# Patient Record
Sex: Male | Born: 1940 | Race: White | Hispanic: No | Marital: Married | Smoking: Never smoker
Health system: Southern US, Community
[De-identification: ages and names within clinical notes are randomized; demographics above are authoritative.]

## PROBLEM LIST (undated history)

## (undated) DIAGNOSIS — N401 Enlarged prostate with lower urinary tract symptoms: Secondary | ICD-10-CM

## (undated) DIAGNOSIS — Z973 Presence of spectacles and contact lenses: Secondary | ICD-10-CM

## (undated) DIAGNOSIS — J301 Allergic rhinitis due to pollen: Secondary | ICD-10-CM

## (undated) DIAGNOSIS — K219 Gastro-esophageal reflux disease without esophagitis: Secondary | ICD-10-CM

## (undated) DIAGNOSIS — Z85828 Personal history of other malignant neoplasm of skin: Secondary | ICD-10-CM

## (undated) DIAGNOSIS — M26609 Unspecified temporomandibular joint disorder, unspecified side: Secondary | ICD-10-CM

## (undated) DIAGNOSIS — N138 Other obstructive and reflux uropathy: Secondary | ICD-10-CM

## (undated) DIAGNOSIS — I10 Essential (primary) hypertension: Secondary | ICD-10-CM

## (undated) DIAGNOSIS — C801 Malignant (primary) neoplasm, unspecified: Secondary | ICD-10-CM

## (undated) DIAGNOSIS — N400 Enlarged prostate without lower urinary tract symptoms: Secondary | ICD-10-CM

## (undated) DIAGNOSIS — A389 Scarlet fever, uncomplicated: Secondary | ICD-10-CM

## (undated) DIAGNOSIS — Z978 Presence of other specified devices: Secondary | ICD-10-CM

## (undated) DIAGNOSIS — E039 Hypothyroidism, unspecified: Secondary | ICD-10-CM

## (undated) DIAGNOSIS — M1990 Unspecified osteoarthritis, unspecified site: Secondary | ICD-10-CM

## (undated) HISTORY — PX: TONSILLECTOMY: SUR1361

## (undated) HISTORY — DX: Essential (primary) hypertension: I10

## (undated) HISTORY — DX: Unspecified temporomandibular joint disorder, unspecified side: M26.609

## (undated) HISTORY — DX: Allergic rhinitis due to pollen: J30.1

## (undated) HISTORY — DX: Scarlet fever, uncomplicated: A38.9

---

## 1998-12-11 ENCOUNTER — Encounter: Payer: Self-pay | Admitting: Internal Medicine

## 1998-12-11 ENCOUNTER — Ambulatory Visit (HOSPITAL_COMMUNITY): Admission: RE | Admit: 1998-12-11 | Discharge: 1998-12-11 | Payer: Self-pay | Admitting: Internal Medicine

## 1999-07-24 ENCOUNTER — Ambulatory Visit (HOSPITAL_COMMUNITY): Admission: RE | Admit: 1999-07-24 | Discharge: 1999-07-24 | Payer: Self-pay | Admitting: Gastroenterology

## 1999-07-24 ENCOUNTER — Encounter (INDEPENDENT_AMBULATORY_CARE_PROVIDER_SITE_OTHER): Payer: Self-pay

## 2004-10-09 ENCOUNTER — Ambulatory Visit: Payer: Self-pay | Admitting: Internal Medicine

## 2004-12-14 ENCOUNTER — Ambulatory Visit: Payer: Self-pay | Admitting: Internal Medicine

## 2004-12-20 ENCOUNTER — Ambulatory Visit: Payer: Self-pay | Admitting: Internal Medicine

## 2006-01-29 ENCOUNTER — Ambulatory Visit: Payer: Self-pay | Admitting: Internal Medicine

## 2006-02-03 ENCOUNTER — Ambulatory Visit: Payer: Self-pay | Admitting: Internal Medicine

## 2006-04-07 ENCOUNTER — Encounter: Admission: RE | Admit: 2006-04-07 | Discharge: 2006-04-07 | Payer: Self-pay | Admitting: Sports Medicine

## 2006-04-17 ENCOUNTER — Encounter: Admission: RE | Admit: 2006-04-17 | Discharge: 2006-04-17 | Payer: Self-pay | Admitting: Sports Medicine

## 2006-07-08 ENCOUNTER — Ambulatory Visit: Payer: Self-pay | Admitting: Internal Medicine

## 2006-07-08 LAB — CONVERTED CEMR LAB: PSA: 1.79 ng/mL (ref 0.10–4.00)

## 2007-01-01 ENCOUNTER — Ambulatory Visit: Payer: Self-pay | Admitting: Internal Medicine

## 2007-07-17 ENCOUNTER — Encounter: Payer: Self-pay | Admitting: *Deleted

## 2007-07-17 DIAGNOSIS — M26609 Unspecified temporomandibular joint disorder, unspecified side: Secondary | ICD-10-CM | POA: Insufficient documentation

## 2007-07-17 DIAGNOSIS — Z86010 Personal history of colonic polyps: Secondary | ICD-10-CM | POA: Insufficient documentation

## 2007-07-17 DIAGNOSIS — Z9089 Acquired absence of other organs: Secondary | ICD-10-CM | POA: Insufficient documentation

## 2007-07-17 DIAGNOSIS — A389 Scarlet fever, uncomplicated: Secondary | ICD-10-CM | POA: Insufficient documentation

## 2007-07-17 DIAGNOSIS — J301 Allergic rhinitis due to pollen: Secondary | ICD-10-CM | POA: Insufficient documentation

## 2007-07-17 DIAGNOSIS — I10 Essential (primary) hypertension: Secondary | ICD-10-CM | POA: Insufficient documentation

## 2007-08-07 ENCOUNTER — Ambulatory Visit: Payer: Self-pay | Admitting: Internal Medicine

## 2007-08-07 LAB — CONVERTED CEMR LAB
ALT: 22 units/L (ref 0–53)
AST: 23 units/L (ref 0–37)
Albumin: 3.8 g/dL (ref 3.5–5.2)
Alkaline Phosphatase: 60 units/L (ref 39–117)
BUN: 12 mg/dL (ref 6–23)
Basophils Absolute: 0 10*3/uL (ref 0.0–0.1)
Basophils Relative: 0.5 % (ref 0.0–1.0)
Bilirubin Urine: NEGATIVE
Bilirubin, Direct: 0.2 mg/dL (ref 0.0–0.3)
CO2: 29 meq/L (ref 19–32)
Calcium: 9.6 mg/dL (ref 8.4–10.5)
Chloride: 105 meq/L (ref 96–112)
Cholesterol: 189 mg/dL (ref 0–200)
Creatinine, Ser: 1.1 mg/dL (ref 0.4–1.5)
Eosinophils Absolute: 0.2 10*3/uL (ref 0.0–0.6)
Eosinophils Relative: 2.8 % (ref 0.0–5.0)
GFR calc Af Amer: 86 mL/min
GFR calc non Af Amer: 71 mL/min
Glucose, Bld: 97 mg/dL (ref 70–99)
HCT: 45.1 % (ref 39.0–52.0)
HDL: 58.4 mg/dL (ref >39.0)
Hemoglobin, Urine: NEGATIVE
Hemoglobin: 15.1 g/dL (ref 13.0–17.0)
Ketones, ur: NEGATIVE mg/dL
LDL Cholesterol: 110 mg/dL — ABNORMAL HIGH (ref 0–99)
Leukocytes, UA: NEGATIVE
Lymphocytes Relative: 30.6 % (ref 12.0–46.0)
MCHC: 33.4 g/dL (ref 30.0–36.0)
MCV: 93.1 fL (ref 78.0–100.0)
Monocytes Absolute: 0.6 10*3/uL (ref 0.2–0.7)
Monocytes Relative: 7.8 % (ref 3.0–11.0)
Neutro Abs: 4.3 10*3/uL (ref 1.4–7.7)
Neutrophils Relative %: 58.3 % (ref 43.0–77.0)
Nitrite: NEGATIVE
PSA: 1.58 ng/mL (ref 0.10–4.00)
Platelets: 226 10*3/uL (ref 150–400)
Potassium: 4.4 meq/L (ref 3.5–5.1)
RBC: 4.84 M/uL (ref 4.22–5.81)
RDW: 12.5 % (ref 11.5–14.6)
Sodium: 140 meq/L (ref 135–145)
Specific Gravity, Urine: 1.015 (ref 1.000–1.03)
TSH: 3.38 microintl units/mL (ref 0.35–5.50)
Total Bilirubin: 0.9 mg/dL (ref 0.3–1.2)
Total CHOL/HDL Ratio: 3.2
Total Protein, Urine: NEGATIVE mg/dL
Total Protein: 6.5 g/dL (ref 6.0–8.3)
Triglycerides: 104 mg/dL (ref 0–149)
Urine Glucose: NEGATIVE mg/dL
Urobilinogen, UA: 0.2 (ref 0.0–1.0)
VLDL: 21 mg/dL (ref 0–40)
WBC: 7.3 10*3/uL (ref 4.5–10.5)
pH: 6 (ref 5.0–8.0)

## 2007-08-13 ENCOUNTER — Ambulatory Visit: Payer: Self-pay | Admitting: Internal Medicine

## 2007-08-20 ENCOUNTER — Encounter: Payer: Self-pay | Admitting: Internal Medicine

## 2008-03-24 ENCOUNTER — Telehealth (INDEPENDENT_AMBULATORY_CARE_PROVIDER_SITE_OTHER): Payer: Self-pay | Admitting: *Deleted

## 2008-06-01 ENCOUNTER — Ambulatory Visit: Payer: Self-pay | Admitting: Internal Medicine

## 2008-06-01 DIAGNOSIS — J069 Acute upper respiratory infection, unspecified: Secondary | ICD-10-CM | POA: Insufficient documentation

## 2008-11-14 ENCOUNTER — Ambulatory Visit: Payer: Self-pay | Admitting: Internal Medicine

## 2008-11-16 ENCOUNTER — Encounter (INDEPENDENT_AMBULATORY_CARE_PROVIDER_SITE_OTHER): Payer: Self-pay | Admitting: *Deleted

## 2009-01-11 ENCOUNTER — Ambulatory Visit: Payer: Self-pay | Admitting: Internal Medicine

## 2009-01-11 LAB — CONVERTED CEMR LAB
ALT: 21 units/L (ref 0–53)
AST: 28 units/L (ref 0–37)
Albumin: 4 g/dL (ref 3.5–5.2)
Alkaline Phosphatase: 57 units/L (ref 39–117)
BUN: 19 mg/dL (ref 6–23)
Basophils Absolute: 0.1 10*3/uL (ref 0.0–0.1)
Basophils Relative: 0.9 % (ref 0.0–3.0)
Bilirubin Urine: NEGATIVE
Bilirubin, Direct: 0.2 mg/dL (ref 0.0–0.3)
CO2: 30 meq/L (ref 19–32)
Calcium: 9.1 mg/dL (ref 8.4–10.5)
Chloride: 107 meq/L (ref 96–112)
Cholesterol: 167 mg/dL (ref 0–200)
Creatinine, Ser: 1.1 mg/dL (ref 0.4–1.5)
Eosinophils Absolute: 0.2 10*3/uL (ref 0.0–0.7)
Eosinophils Relative: 3 % (ref 0.0–5.0)
GFR calc non Af Amer: 70.67 mL/min (ref >60)
Glucose, Bld: 97 mg/dL (ref 70–99)
HCT: 43 % (ref 39.0–52.0)
HDL: 68 mg/dL (ref >39.00)
Hemoglobin, Urine: NEGATIVE
Hemoglobin: 15 g/dL (ref 13.0–17.0)
Ketones, ur: NEGATIVE mg/dL
LDL Cholesterol: 90 mg/dL (ref 0–99)
Leukocytes, UA: NEGATIVE
Lymphocytes Relative: 29.2 % (ref 12.0–46.0)
Lymphs Abs: 2 10*3/uL (ref 0.7–4.0)
MCHC: 34.9 g/dL (ref 30.0–36.0)
MCV: 93.9 fL (ref 78.0–100.0)
Monocytes Absolute: 0.6 10*3/uL (ref 0.1–1.0)
Monocytes Relative: 8.5 % (ref 3.0–12.0)
Neutro Abs: 4 10*3/uL (ref 1.4–7.7)
Neutrophils Relative %: 58.4 % (ref 43.0–77.0)
Nitrite: NEGATIVE
PSA: 2.08 ng/mL (ref 0.10–4.00)
Platelets: 167 10*3/uL (ref 150.0–400.0)
Potassium: 4.4 meq/L (ref 3.5–5.1)
RBC: 4.58 M/uL (ref 4.22–5.81)
RDW: 12.7 % (ref 11.5–14.6)
Sodium: 141 meq/L (ref 135–145)
Specific Gravity, Urine: 1.025 (ref 1.000–1.030)
TSH: 5.12 microintl units/mL (ref 0.35–5.50)
Total Bilirubin: 1.1 mg/dL (ref 0.3–1.2)
Total CHOL/HDL Ratio: 2
Total Protein, Urine: NEGATIVE mg/dL
Total Protein: 7.1 g/dL (ref 6.0–8.3)
Triglycerides: 45 mg/dL (ref 0.0–149.0)
Urine Glucose: NEGATIVE mg/dL
Urobilinogen, UA: 0.2 (ref 0.0–1.0)
VLDL: 9 mg/dL (ref 0.0–40.0)
WBC: 6.9 10*3/uL (ref 4.5–10.5)
pH: 6 (ref 5.0–8.0)

## 2009-01-16 ENCOUNTER — Ambulatory Visit: Payer: Self-pay | Admitting: Internal Medicine

## 2010-02-15 ENCOUNTER — Ambulatory Visit: Payer: Self-pay | Admitting: Internal Medicine

## 2010-03-22 ENCOUNTER — Ambulatory Visit: Payer: Self-pay | Admitting: Internal Medicine

## 2010-03-22 ENCOUNTER — Encounter: Payer: Self-pay | Admitting: Internal Medicine

## 2010-03-22 LAB — CONVERTED CEMR LAB
ALT: 23 units/L (ref 0–53)
AST: 24 units/L (ref 0–37)
Albumin: 4.2 g/dL (ref 3.5–5.2)
Alkaline Phosphatase: 65 units/L (ref 39–117)
BUN: 17 mg/dL (ref 6–23)
Basophils Absolute: 0.1 10*3/uL (ref 0.0–0.1)
Basophils Relative: 0.7 % (ref 0.0–3.0)
Bilirubin, Direct: 0.1 mg/dL (ref 0.0–0.3)
CO2: 27 meq/L (ref 19–32)
Calcium: 9.2 mg/dL (ref 8.4–10.5)
Chloride: 103 meq/L (ref 96–112)
Cholesterol: 194 mg/dL (ref 0–200)
Creatinine, Ser: 1.1 mg/dL (ref 0.4–1.5)
Eosinophils Absolute: 0.2 10*3/uL (ref 0.0–0.7)
Eosinophils Relative: 2.2 % (ref 0.0–5.0)
GFR calc non Af Amer: 71.93 mL/min (ref >60)
Glucose, Bld: 97 mg/dL (ref 70–99)
HCT: 43.5 % (ref 39.0–52.0)
HDL: 75.4 mg/dL (ref >39.00)
Hemoglobin: 15 g/dL (ref 13.0–17.0)
LDL Cholesterol: 106 mg/dL — ABNORMAL HIGH (ref 0–99)
Lymphocytes Relative: 27.9 % (ref 12.0–46.0)
Lymphs Abs: 2.4 10*3/uL (ref 0.7–4.0)
MCHC: 34.6 g/dL (ref 30.0–36.0)
MCV: 94.8 fL (ref 78.0–100.0)
Monocytes Absolute: 0.7 10*3/uL (ref 0.1–1.0)
Monocytes Relative: 8.6 % (ref 3.0–12.0)
Neutro Abs: 5.2 10*3/uL (ref 1.4–7.7)
Neutrophils Relative %: 60.6 % (ref 43.0–77.0)
PSA: 2.43 ng/mL (ref 0.10–4.00)
Platelets: 198 10*3/uL (ref 150.0–400.0)
Potassium: 4.5 meq/L (ref 3.5–5.1)
RBC: 4.59 M/uL (ref 4.22–5.81)
RDW: 13.1 % (ref 11.5–14.6)
Sodium: 137 meq/L (ref 135–145)
TSH: 4.35 microintl units/mL (ref 0.35–5.50)
Total Bilirubin: 0.6 mg/dL (ref 0.3–1.2)
Total CHOL/HDL Ratio: 3
Total Protein: 6.9 g/dL (ref 6.0–8.3)
Triglycerides: 61 mg/dL (ref 0.0–149.0)
VLDL: 12.2 mg/dL (ref 0.0–40.0)
WBC: 8.5 10*3/uL (ref 4.5–10.5)

## 2010-05-16 ENCOUNTER — Telehealth: Payer: Self-pay | Admitting: Internal Medicine

## 2010-07-10 NOTE — Progress Notes (Signed)
Summary: PA-Rhinocort  Phone Note From Pharmacy Call back at [phone]   Caller: [facility]  [address]  [phone]* Summary of Call: Per Pharmacy pt needs a PA for Rhinocort-insurance will cover generic Nasonex, Flonase Initial call taken by: Brenton Grills CMA Duncan Dull),  May 16, 2010 2:45 PM  Follow-up for Phone Call        ok for generic fluticasone 1 spray to each nostril once a day, 1 bottle, refill as needed.  Follow-up by: Jacques Navy MD,  May 16, 2010 5:08 PM    New/Updated Medications: FLONASE 50 MCG/ACT SUSP (FLUTICASONE PROPIONATE) 1 spray each nostril once daily Prescriptions: FLONASE 50 MCG/ACT SUSP (FLUTICASONE PROPIONATE) 1 spray each nostril once daily  #1 x 6   Entered by:   Lamar Sprinkles, CMA   Authorized by:   Jacques Navy MD   Signed by:   Lamar Sprinkles, CMA on 05/16/2010   Method used:   [facility]        [facility]  [phone]* (retail)       [address] [place], [place]  [address]       Ph: [phone] or [phone]       Fax: [phone]   RxID:   [id number]

## 2010-07-10 NOTE — Assessment & Plan Note (Signed)
Summary: yearly f/u medicare / will come fasting / # cd   Vital Signs:  Patient profile:   70 year old male Height:      69 inches Weight:      179 pounds BMI:     26.53 O2 Sat:      97 % on Room air Temp:     98.1 degrees F oral Pulse rate:   50 / minute BP sitting:   136 / 88  (left arm) Cuff size:   regular  Vitals Entered By: Bill Salinas CMA (March 22, 2010 10:00 AM)  O2 Flow:  Room air CC: yearly/ab  Vision Screening:      Vision Comments: Last eye exam was oct 2010 normal . Pt is sch for eye exam next week   Primary Care Provider:  Jacques Navy MD  CC:  yearly/ab.  History of Present Illness: Patient presents for a wellness exam and routine medical follow-up. He is feeling good. No major medical problems or injuries or surgies since his last visit. He has had irritation OS secondary to eyelash problem. He is lining this up with an opthalmologist.   He is 100% independent ADLs. 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 No symptoms of depression.   Preventive Screening-Counseling & Management  Alcohol-Tobacco     Alcohol drinks/day: <1     Alcohol type: beer     Smoking Status: never  Caffeine-Diet-Exercise     Caffeine use/day: 1 cup      Diet Comments: regular diet, heavy on the biscuits     Diet Counseling: not indicated; diet is assessed to be healthy     Does Patient Exercise: yes     Type of exercise: walking,      Exercise (avg: min/session): >60     Times/week: 6     MSH Depression Score: no depression  Hep-HIV-STD-Contraception     Hepatitis Risk: no risk noted     HIV Risk: no risk noted     STD Risk: no risk noted     Dental Visit-last 6 months no     Dental Care Counseling: to seek dental care; no dental care within six months     Sun Exposure-Excessive: no  Safety-Violence-Falls     Seat Belt Use: yes     Helmet Use: n/a     Firearms in the Home: no firearms in the home   Smoke Detectors: yes     Violence in the Home: no risk noted     Sexual Abuse: no     Fall Risk: Low fall risk      Sexual History:  currently monogamous.        Drug Use:  never.        Blood Transfusions:  no.    Current Medications (verified): 1)  Cardura 4 Mg  Tabs (Doxazosin Mesylate) .... Take 1 Tablet By Mouth Once A Day 2)  Prinivil 10 Mg  Tabs (Lisinopril) .... Take 1 Tablet By Mouth Once A Day 3)  Rhinocort Aqua 32 Mcg/act  Susp (Budesonide) .... Use As Needed and As Directed. 4)  Aspirin 81 Mg  Tabs (Aspirin) .... 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   Tobacco: no EtOH: <6 drinks/week (beer)  Social History: Caffeine use/day:  1 cup  Does Patient Exercise:  yes Dental Care w/in 6 mos.:  no Sun Exposure-Excessive:  no Seat Belt Use:  yes Fall Risk:  Low fall risk Hepatitis Risk:  no risk noted HIV Risk:  no risk noted STD Risk:  no risk noted Sexual History:  currently monogamous Drug Use:  never Blood Transfusions:  no  Review of Systems  The patient denies anorexia, fever, weight loss, weight gain, decreased hearing, hoarseness, chest pain, dyspnea on exertion, peripheral edema, prolonged cough, hemoptysis, abdominal  pain, hematochezia, severe indigestion/heartburn, incontinence, genital sores, muscle weakness, difficulty walking, depression, unusual weight change, enlarged lymph nodes, and angioedema.    Physical Exam  General:  Well-developed,well-nourished,in no acute distress; alert,appropriate and cooperative throughout examination Head:  Normocephalic and atraumatic without obvious abnormalities. No apparent alopecia or balding. Eyes:  No corneal or conjunctival inflammation noted. EOMI. Perrla. Funduscopic exam benign, without hemorrhages, exudates or papilledema. Vision grossly normal. Ears:  External ear exam shows no significant lesions or deformities.  Otoscopic examination reveals clear canals, tympanic membranes are intact bilaterally without bulging, retraction, inflammation or discharge. Hearing is grossly normal bilaterally. Nose:  no external deformity and no external erythema.   Mouth:  Oral mucosa and oropharynx without lesions or exudates.  Teeth in good repair. Neck:  supple, full ROM, and no thyromegaly.   Chest Wall:  No deformities, masses, tenderness or gynecomastia noted. Lungs:  Normal respiratory effort, chest expands symmetrically. Lungs are clear to auscultation, no crackles or wheezes. Heart:  Normal rate and regular rhythm. S1 and S2 normal without gallop, murmur, click, rub or other extra sounds. Abdomen:  soft, non-tender, no masses, no guarding, and no hepatomegaly.   Rectal:  No external abnormalities noted. Normal sphincter tone. No rectal masses or tenderness. Prostate:  Prostate gland firm and smooth, no enlargement, nodularity, tenderness, mass, asymmetry or induration. Msk:  normal ROM, no joint tenderness, no joint swelling, and no joint warmth.   Pulses:  2+ radial and DP pulses Extremities:  No clubbing, cyanosis, edema, or deformity noted with normal full range of motion of all joints.   Neurologic:  alert & oriented X3, cranial nerves II-XII intact, strength  normal in all extremities, gait normal, and DTRs symmetrical and normal.   Skin:  turgor normal, color normal, and no suspicious lesions.   Cervical Nodes:  no anterior cervical adenopathy and no posterior cervical adenopathy.   Axillary Nodes:  no R axillary adenopathy and no L axillary adenopathy.   Psych:  Oriented X3, memory intact for recent and remote, normally interactive, and good eye contact.     Impression & Recommendations:  Problem # 1:  HYPERTENSION (ICD-401.9)  His updated medication list for this problem includes:    Cardura 4 Mg Tabs (Doxazosin mesylate) .Marland Kitchen... Take 1 tablet by mouth once a day    Prinivil 10 Mg Tabs (Lisinopril) .Marland Kitchen... Take 1 tablet by mouth once a day  BP today: 136/88 Prior BP: 98/62 (01/16/2009)  Adeqaute control on present medications  Problem # 2:  Preventive Health Care (ICD-V70.0)  Unremarkable interval history. PHysical exam is normal. Lab results, including PSA are within normal limits.  Last colonoscopy /09. Immunizations: Tetnus & flu vaccine Sept '11; Pneumonia and shingles vaccine August '07.   Patient is independent in ADLs, cognitively intact, without depression. He has had no falls and has no fall risk. He is counselled to continue exercise, to loose weight   In summary - a nice man who is stable and doing well. ROV 1 year.   Orders: Medicare -1st Annual Wellness Visit [phone])  Complete Medication List: 1)  Cardura 4 Mg Tabs (Doxazosin mesylate) .... Take 1 tablet by mouth once a day 2)  Prinivil 10 Mg Tabs (Lisinopril) .... Take 1 tablet by mouth once a day 3)  Rhinocort Aqua 32 Mcg/act Susp (Budesonide) .... Use as needed and as directed. 4)  Aspirin 81 Mg Tabs (Aspirin) .... Take one tablet once daily  Other Orders: TLB-BMP (Basic Metabolic Panel-BMET) (80048-METABOL) TLB-CBC Platelet - w/Differential (85025-CBCD) TLB-Lipid Panel (80061-LIPID) TLB-Hepatic/Liver Function Pnl (80076-HEPATIC) TLB-TSH (Thyroid Stimulating  Hormone) (84443-TSH) TLB-PSA (Prostate Specific Antigen) (27062-BJS)   Preventive Care Screening  Colonoscopy:    Date:  08/20/2007    Next Due:  08/2012    Results:  abnormal

## 2010-07-10 NOTE — Progress Notes (Signed)
  Phone Note Call from Patient      Prescriptions: RHINOCORT AQUA 32 MCG/ACT  SUSP (BUDESONIDE) use as needed and as directed.  #1 x 2   Entered by:   Bill Salinas CMA   Authorized by:   Jacques Navy MD   Signed by:   Bill Salinas CMA on 05/16/2010   Method used:   [facility]        [facility]  [phone]* (retail)       [address] [place], [place]  [address]       Ph: [phone] or [phone]       Fax: [phone]   RxID:   [id number]

## 2010-07-10 NOTE — Assessment & Plan Note (Signed)
Summary: flu shot & tdap shot (pt will check w/medicare)cd  Nurse Visit   Allergies: No Known Drug Allergies  Immunizations Administered:  Tetanus Vaccine:    Vaccine Type: Tdap    Site: left deltoid    Mfr: Sanofi Pasteur    Dose: 0.5 ml    Route: IM    Given by: Margaret Pyle, CMA    Exp. Date: 03/29/2012    Lot #: [id number]    VIS given: 04/27/08 version given February 15, 2010.  Orders Added: 1)  Flu Vaccine 54yrs + MEDICARE PATIENTS [Q2039] 2)  Administration Flu vaccine - MCR [G0008] 3)  Tdap => 59yrs IM [90715] 4)  Admin 1st Vaccine [09811] Flu Vaccine Consent Questions     Do you have a history of severe allergic reactions to this vaccine? no    Any prior history of allergic reactions to egg and/or gelatin? no    Do you have a sensitivity to the preservative Thimersol? no    Do you have a past history of Guillan-Barre Syndrome? no    Do you currently have an acute febrile illness? no    Have you ever had a severe reaction to latex? no    Vaccine information given and explained to patient? yes    Are you currently pregnant? no    Lot Number:[id number]   Exp Date:12/08/2010   Site Given  Right Deltoid IM .lbmedflu

## 2010-10-26 NOTE — Assessment & Plan Note (Signed)
[facility]                             PRIMARY CARE OFFICE NOTE   Nathan Reilly, Nathan Reilly                         MRN:          [id number]  DATE:02/03/2006                            DOB:          [birth year]    Nathan Reilly is a very delightful 70 year old gentleman who presents for  follow-up evaluation and exam.  He was last seen December 20, 2004.  Please see  that dictation for complete past medical history, family history and social  history.   INTERVAL:  The patient has done well with no new health problems.   REVIEW OF SYSTEMS:  Negative for constitutional, cardiovascular,  respiratory, GI or GU problems.   CURRENT MEDICATIONS:  1. Cardura 4 mg daily.  2. Prinivil 10 mg daily.  3. Claritin p.r.n.  4. Rhinocort Aqua p.r.n.   PHYSICAL EXAMINATION:  VITAL SIGNS:  Temperature was 98.1, blood pressure  108/73, pulse 59, weight 184.  GENERAL APPEARANCE:  A well-nourished, well-developed gentleman looking  younger than his stated chronologic age in no acute distress.  HEENT:  Normocephalic, atraumatic.  EACs and TMs were unremarkable.  Oropharynx with native dentition in good repair.  No buccal or palatal  lesions were noted.  Posterior pharynx was clear.  Conjunctivae and sclerae  were clear.  PERRLA, EOMI.  Funduscopic exam was unremarkable.  NECK:  Supple without thyromegaly.  NODES:  No adenopathy was noted in the cervical or supraclavicular regions.  CHEST:  No CVA tenderness.  Lungs were clear to auscultation and percussion.  CARDIOVASCULAR:  2+ radial pulses, no JVD or carotid bruit.  He had a quiet  precordium with regular rate and rhythm without murmurs, rubs or gallops.  ABDOMEN:  Soft, no guarding or rebound.  No organosplenomegaly was noted.  GENITALIA:  Normal male phallus, bilaterally descended testicles without  masses.  RECTAL:  Normal sphincter tone.  The prostate smooth with normal size and  contour, without nodules.  EXTREMITIES:   Without clubbing, cyanosis, edema or deformity.  NEUROLOGIC:  Nonfocal.  SKIN:  Clear.   LABORATORY DATA:  Hemoglobin was 15.4 g, white count was 7900 with a normal  differential.  Chemistries were unremarkable with glucose of 108.  Kidney  functions and liver functions were normal.  Thyroid function normal with a  TSH of 3.41.  PSA was normal at 1.72.  Cholesterol was 197, triglycerides  50, HDL 65.9, LDL was 121.   ASSESSMENT AND PLAN:  1. Hypertension.  The patient's blood pressure is very well-controlled on      his present medical regimen.  He will continue the same.  2. Health maintenance.  The patient's last colonoscopy was in 2004 with      Griffith Citron, MD.  Follow-up will be determined by Dr. Kinnie Scales and      the patient.  The patient's exam is unremarkable.  There is no evidence      of prostate cancer and he was reassured.  Cholesterol level is at goal      for a gentleman with low to moderate risk with an LDL  of 121.  The      patient was given pneumonia vaccine and Zostavax at today's visit.   In summary, this is a very pleasant gentleman who is medically stable at  this time.  He will return to see me on a one-year basis or as needed.                                   Rosalyn Gess Norins, MD   MEN/MedQ  DD:  02/03/2006  DT:  02/04/2006  Job #:  [id number]   cc:   Diana Eves. Raul Del, MD

## 2011-03-05 ENCOUNTER — Encounter: Payer: Self-pay | Admitting: Internal Medicine

## 2011-03-18 ENCOUNTER — Other Ambulatory Visit: Payer: Self-pay | Admitting: *Deleted

## 2011-03-18 MED ORDER — LISINOPRIL 10 MG PO TABS: 10.0000 mg | ORAL_TABLET | Freq: Every day | ORAL | Status: DC

## 2011-04-09 ENCOUNTER — Other Ambulatory Visit: Payer: Self-pay | Admitting: Internal Medicine

## 2011-04-10 ENCOUNTER — Encounter: Payer: Self-pay | Admitting: Internal Medicine

## 2011-04-11 ENCOUNTER — Other Ambulatory Visit (INDEPENDENT_AMBULATORY_CARE_PROVIDER_SITE_OTHER): Payer: Medicare Other

## 2011-04-11 ENCOUNTER — Ambulatory Visit (INDEPENDENT_AMBULATORY_CARE_PROVIDER_SITE_OTHER): Payer: Medicare Other | Admitting: Internal Medicine

## 2011-04-11 ENCOUNTER — Encounter: Payer: Self-pay | Admitting: Internal Medicine

## 2011-04-11 VITALS — BP 116/86 | HR 62 | Temp 97.5°F | Ht 68.0 in | Wt 176.0 lb

## 2011-04-11 DIAGNOSIS — I10 Essential (primary) hypertension: Secondary | ICD-10-CM

## 2011-04-11 DIAGNOSIS — Z23 Encounter for immunization: Secondary | ICD-10-CM

## 2011-04-11 DIAGNOSIS — Z125 Encounter for screening for malignant neoplasm of prostate: Secondary | ICD-10-CM

## 2011-04-11 DIAGNOSIS — Z0000 Encounter for general adult medical examination without abnormal findings: Secondary | ICD-10-CM

## 2011-04-11 LAB — COMPREHENSIVE METABOLIC PANEL
BUN: 22 mg/dL (ref 6–23)
CO2: 25 mEq/L (ref 19–32)
Creatinine, Ser: 1.1 mg/dL (ref 0.4–1.5)
GFR: 70.21 mL/min (ref >60.00)
Glucose, Bld: 94 mg/dL (ref 70–99)
Total Bilirubin: 0.8 mg/dL (ref 0.3–1.2)

## 2011-04-11 MED ORDER — FLUTICASONE PROPIONATE 50 MCG/ACT NA SUSP: 2.0000 | Freq: Every day | NASAL | Status: DC

## 2011-04-11 NOTE — Progress Notes (Signed)
Subjective:    Patient ID: Nathan Reilly, male    DOB: 1941/05/24, 70 y.o.   MRN: [id number]  HPI  The patient is here for annual Medicare wellness examination and management of other chronic and acute problems. Has developed DIP swelling 5th digit both hands with a minor AM gel.    The risk factors are reflected in the social history.  The roster of all physicians providing medical care to patient - is listed in the Snapshot section of the chart.  Activities of daily living:  The patient is 100% inedpendent in all ADLs: dressing, toileting, feeding as well as independent mobility  Home safety : The patient has smoke detectors in the home. They wear seatbelts. No firearms at home. There is no violence in the home.   There is no risks for hepatitis, STDs or HIV. There is no   history of blood transfusion. They have no travel history to infectious disease endemic areas of the world.  The patient has seen their dentist in the last six month. They have  seen their eye doctor in the last year. They deny any hearing difficulty and have not had audiologic testing in the last year.  They do not  have excessive sun exposure. Discussed the need for sun protection: hats, long sleeves and use of sunscreen if there is significant sun exposure.   Diet: the importance of a healthy diet is discussed. They do have a healthy diet.  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   The benefits of regular aerobic exercise were discussed.  Depression screen: there are no signs or vegative symptoms of depression- irritability, change in appetite, anhedonia, sadness/tearfullness.  Cognitive assessment: the patient manages all their financial and personal affairs and is actively engaged.   The following portions of the patient's history were reviewed and updated as appropriate: allergies, current medications, past  family history, past medical history,  past surgical history, past social history  and problem list.  Vision, hearing, body mass index were assessed and reviewed.   During the course of the visit the patient was educated and counseled about appropriate screening and preventive services including : fall prevention , diabetes screening, nutrition counseling, colorectal cancer screening, and recommended immunizations.  Past Medical History  Diagnosis Date  . Hay fever   . Scarlet fever   . Temporomandibular joint disorders, unspecified   . Hypertension    Past Surgical History  Procedure Date  . Polypectomy   . Tonsillectomy    Family History  Problem Relation Age of Onset  . Cancer Father   . Diabetes Father   . Cancer Sister     breast  . Hypertension Neg Hx   . Heart disease Neg Hx    History   Social History  . Marital Status: Married    Spouse Name: N/A    Number of Children: 2  . Years of Education: 16   Occupational History  . sales     retired   Social History Main Topics  . Smoking status: Never Smoker   . Smokeless tobacco: Never Used  . Alcohol Use: 3.0 oz/week    6 drink(s) per week  . Drug Use: No  . Sexually Active: Yes -- Male partner(s)   Other Topics Concern  . 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 Married '68, Marriage in good health. 2 sons- '72, '77; 3 grand-daughters.No complaints regarding sexual health. Retired - Therapist, occupational. ACP - does not want heroic or futile measures in the face of loss of function or quality of life.         Review of Systems Constitutional:  Negative for fever, chills, activity change and unexpected weight change.  HEENT:  Negative for hearing loss, ear pain, congestion, neck stiffness and postnasal drip. Negative for sore throat or swallowing problems. Negative for dental  complaints.   Eyes: Negative for vision loss or change in visual acuity.  Respiratory: Negative for chest tightness and wheezing. Negative for DOE.   Cardiovascular: Negative for chest pain or palpitations. No decreased exercise tolerance Gastrointestinal: No change in bowel habit. No bloating or gas. No reflux or indigestion Genitourinary: Negative for urgency, frequency, flank pain and difficulty urinating.  Musculoskeletal: Negative for myalgias, back pain, arthralgias and gait problem except for DIP 5th digit.  Neurological: Negative for dizziness, tremors, weakness and headaches.  Hematological: Negative for adenopathy.  Psychiatric/Behavioral: Negative for behavioral problems and dysphoric mood.  Derm - lesion 3rd finger below the nail with a raised lesion.       Objective:   Physical Exam Vital signs reviewed Gen'l: Well nourished well developed white male in no acute distress who looks younger than his stated age.  HEENT: Head: Normocephalic and atraumatic. Right Ear: External ear normal. EAC/TM nl. Left Ear: External ear normal.  EAC/TM nl. Nose: Nose normal. Mouth/Throat: Oropharynx is clear and moist. Dentition - native, in good repair. No buccal or palatal lesions. Posterior pharynx clear. Eyes: Conjunctivae and sclera clear. EOM intact. Pupils are equal, round, and reactive to light. Right eye exhibits no discharge. Left eye exhibits no discharge. Neck: Normal range of motion. Neck supple. No JVD present. No tracheal deviation present. No thyromegaly present.  Cardiovascular: Normal rate, regular rhythm, no gallop, no friction rub, no murmur heard.      Quiet precordium. 2+ radial and DP pulses . No carotid bruits. Pulmonary/Chest: Effort normal. No respiratory distress or increased WOB, no wheezes, no rales. No chest wall deformity or CVAT. Abdominal: Soft. Bowel sounds are normal in all quadrants. He exhibits no distension, no tenderness, no rebound or guarding, No  heptosplenomegaly  Genitourinary: deferred to PSA testing.  Musculoskeletal: Normal range of motion. He exhibits no edema and no tenderness.       Small and large joints without redness, synovial thickening or deformity except for enlargement DIP joints 5th digits. Full range of motion preserved about all small, median and large joints.  Lymphadenopathy:    He has no cervical or supraclavicular adenopathy.  Neurological: He is alert and oriented to person, place, and time. CN II-XII intact. DTRs 2+ and symmetrical biceps, radial and patellar tendons. Cerebellar function normal with no tremor, rigidity, normal gait and station.  Skin: Skin is warm and dry. No rash noted. No erythema. lesion at base of nail 3rd digit right with an irritated center. Psychiatric: He has a normal mood and affect. His behavior is normal. Thought content normal.   Lab Results  Component Value Date   WBC 8.5 03/22/2010   HGB 15.0 03/22/2010   HCT 43.5 03/22/2010   PLT 198.0 03/22/2010   GLUCOSE 94 04/11/2011   CHOL 194 03/22/2010   TRIG 61.0 03/22/2010   HDL 75.40 03/22/2010   LDLCALC 106* 03/22/2010  ALT 24 04/11/2011   AST 30 04/11/2011   NA 139 04/11/2011   K 4.4 04/11/2011   CL 106 04/11/2011   CREATININE 1.1 04/11/2011   BUN 22 04/11/2011   CO2 25 04/11/2011   TSH 4.35 03/22/2010   PSA 3.38 04/11/2011          Assessment & Plan:

## 2011-04-12 DIAGNOSIS — Z0000 Encounter for general adult medical examination without abnormal findings: Secondary | ICD-10-CM | POA: Insufficient documentation

## 2011-04-12 NOTE — Assessment & Plan Note (Signed)
Interval history is unremarkable - he has been healthy. Physical exam is normal. Lab results are in normal range. Previous lipid panel was normal. He is current with colorectal cancer screening with last study Oct '11. Immunizations: Tdap Sept '11; Pneumonia and shingles vaccine Aug '07.   In summary - a very nice man who is medically stable. He is fit and takes good care of himself. He will return as needed or in 1 year.

## 2011-04-12 NOTE — Assessment & Plan Note (Signed)
BP Readings from Last 3 Encounters:  04/11/11 116/86  03/22/10 136/88  01/16/09 98/62   Good control on present medications. Lab results reveal normal renal function and electrolytes.  Plan - continue present medications

## 2011-06-29 ENCOUNTER — Other Ambulatory Visit: Payer: Self-pay | Admitting: Internal Medicine

## 2011-10-12 ENCOUNTER — Other Ambulatory Visit: Payer: Self-pay | Admitting: Internal Medicine

## 2011-10-14 ENCOUNTER — Other Ambulatory Visit: Payer: Self-pay | Admitting: Internal Medicine

## 2011-10-14 NOTE — Telephone Encounter (Signed)
Request refill on lisinopril (PRINIVIL,ZESTRIL) 10 MG tablet please send a yr worth of refills

## 2011-10-16 ENCOUNTER — Other Ambulatory Visit: Payer: Self-pay | Admitting: *Deleted

## 2011-10-16 MED ORDER — LISINOPRIL 10 MG PO TABS: 10.0000 mg | ORAL_TABLET | Freq: Every day | ORAL | Status: DC

## 2011-10-29 MED ORDER — LISINOPRIL 10 MG PO TABS: 10.0000 mg | ORAL_TABLET | Freq: Every day | ORAL | Status: DC

## 2012-04-02 ENCOUNTER — Ambulatory Visit (INDEPENDENT_AMBULATORY_CARE_PROVIDER_SITE_OTHER): Payer: Medicare Other | Admitting: Internal Medicine

## 2012-04-02 ENCOUNTER — Encounter: Payer: Self-pay | Admitting: Internal Medicine

## 2012-04-02 VITALS — BP 124/88 | HR 60 | Temp 97.0°F | Resp 16 | Wt 179.0 lb

## 2012-04-02 DIAGNOSIS — S61219A Laceration without foreign body of unspecified finger without damage to nail, initial encounter: Secondary | ICD-10-CM

## 2012-04-02 DIAGNOSIS — Z23 Encounter for immunization: Secondary | ICD-10-CM | POA: Insufficient documentation

## 2012-04-02 DIAGNOSIS — S61209A Unspecified open wound of unspecified finger without damage to nail, initial encounter: Secondary | ICD-10-CM

## 2012-04-02 NOTE — Patient Instructions (Signed)
Laceration finger - tetanus is up to date.  Wound care: twice a day - soak in water with betadiene - just enough to color the water - for 5-10 minutes                                            Rinse of the betadiene and wash briskly with soap,water and wash cloth - it will probably bleed a litte.                      +/_ neosporin (triple antibiotic ointment) and then a bandaide.

## 2012-04-02 NOTE — Progress Notes (Signed)
  Subjective:    Patient ID: Nathan Reilly, male    DOB: 09/03/1940, 71 y.o.   MRN: [id number]  HPI Nathan Reilly sustained an abrasion to the pad of the index finger left hand on a piece of machinery. It bled freely.  PMH, FamHx and SocHx reviewed for any changes and relevance.  Current Outpatient Prescriptions on File Prior to Visit  Medication Sig Dispense Refill  . aspirin 81 MG tablet Take 81 mg by mouth daily.        Marland Kitchen doxazosin (CARDURA) 4 MG tablet TAKE 1 TABLET BY MOUTH EVERY DAY  30 tablet  5  . fluticasone (FLONASE) 50 MCG/ACT nasal spray Place 2 sprays into the nose daily.  16 g  11  . lisinopril (PRINIVIL,ZESTRIL) 10 MG tablet Take 1 tablet (10 mg total) by mouth daily.  30 tablet  11  . lisinopril (PRINIVIL,ZESTRIL) 10 MG tablet Take 1 tablet (10 mg total) by mouth daily.  30 tablet  11      Review of Systems System review is negative for any constitutional, cardiac, pulmonary, GI or neuro symptoms or complaints other than as described in the HPI.     Objective:   Physical Exam Filed Vitals:   04/02/12 1105  BP: 124/88  Pulse: 60  Temp: 97 F (36.1 C)  Resp: 16   gen'l- WNWD white man Derm - pad of index finger left with an eschar over a 2 cm diameter.       Assessment & Plan:  Laceration finger - tetanus is up to date.  Wound care: twice a day - soak in water with betadiene - just enough to color the water - for 5-10 minutes                                            Rinse of the betadiene and wash briskly with soap,water and wash cloth - it will probably bleed a litte.                      +/_ neosporin (triple antibiotic ointment) and then a bandaide.

## 2012-04-14 ENCOUNTER — Other Ambulatory Visit: Payer: Self-pay | Admitting: Internal Medicine

## 2012-05-13 ENCOUNTER — Ambulatory Visit (INDEPENDENT_AMBULATORY_CARE_PROVIDER_SITE_OTHER): Payer: Medicare Other | Admitting: Internal Medicine

## 2012-05-13 ENCOUNTER — Other Ambulatory Visit (INDEPENDENT_AMBULATORY_CARE_PROVIDER_SITE_OTHER): Payer: Medicare Other

## 2012-05-13 ENCOUNTER — Encounter: Payer: Self-pay | Admitting: Internal Medicine

## 2012-05-13 VITALS — BP 112/68 | HR 53 | Temp 97.4°F | Resp 10 | Ht 69.0 in | Wt 180.0 lb

## 2012-05-13 DIAGNOSIS — Z0000 Encounter for general adult medical examination without abnormal findings: Secondary | ICD-10-CM

## 2012-05-13 DIAGNOSIS — I10 Essential (primary) hypertension: Secondary | ICD-10-CM

## 2012-05-13 LAB — COMPREHENSIVE METABOLIC PANEL
AST: 30 U/L (ref 0–37)
Albumin: 4 g/dL (ref 3.5–5.2)
BUN: 18 mg/dL (ref 6–23)
CO2: 26 mEq/L (ref 19–32)
Calcium: 9.1 mg/dL (ref 8.4–10.5)
Chloride: 104 mEq/L (ref 96–112)
GFR: 77.23 mL/min (ref >60.00)
Potassium: 4.2 mEq/L (ref 3.5–5.1)

## 2012-05-13 MED ORDER — SILDENAFIL CITRATE 100 MG PO TABS: 50.0000 mg | ORAL_TABLET | Freq: Every day | ORAL | Status: DC | PRN

## 2012-05-13 NOTE — Patient Instructions (Addendum)
Thanks for coming to see me. Everything looks good - you're in pretty good condition for the condition you're in.  Full report to follow with labs.  Have a Merry [place].

## 2012-05-13 NOTE — Progress Notes (Signed)
Subjective:    Patient ID: Nathan Reilly, male    DOB: [birth year], 71 y.o.   MRN: [id number]  HPI The patient is here for annual Medicare wellness examination and management of other chronic and acute problems. He is feeling good with no new complaints. In the interval no major illness, no surgery and no injury.   The risk factors are reflected in the social history.  The roster of all physicians providing medical care to patient - is listed in the Snapshot section of the chart.  Activities of daily living:  The patient is 100% inedpendent in all ADLs: dressing, toileting, feeding as well as independent mobility  Home safety : The patient has smoke detectors in the home. Falls - home is fall safe. They wear seatbelts. No firearms at home. There is no violence in the home.   There is no risks for hepatitis, STDs or HIV. There is no history of blood transfusion. They have no travel history to infectious disease endemic areas of the world.  The patient has seen their dentist in the last six month. They have seen their eye doctor in the last year. They deny any hearing difficulty and have not had audiologic testing in the last year.    They do not  have excessive sun exposure. Discussed the need for sun protection: hats, long sleeves and use of sunscreen if there is significant sun exposure.   Diet: the importance of a healthy diet is discussed. They do have a healthy diet.  The patient has a regular exercise program: walk ,  60 min duration, 3+ per week.  The benefits of regular aerobic exercise were discussed.  Depression screen: there are no signs or vegative symptoms of depression- irritability, change in appetite, anhedonia, sadness/tearfullness.  Cognitive assessment: the patient manages all their financial and personal affairs and is actively engaged.   Past Medical History  Diagnosis Date  . Hay fever   . Scarlet fever   . Temporomandibular joint disorders, unspecified   .  Hypertension    Past Surgical History  Procedure Date  . Polypectomy   . Tonsillectomy    Family History  Problem Relation Age of Onset  . Cancer Father   . Diabetes Father   . Cancer Sister     breast  . Hypertension Neg Hx   . Heart disease Neg Hx    History   Social History  . Marital Status: Married    Spouse Name: N/A    Number of Children: 2  . Years of Education: 16   Occupational History  . sales     retired   Social History Main Topics  . Smoking status: Never Smoker   . Smokeless tobacco: Never Used  . Alcohol Use: 3.0 oz/week    6 drink(s) per week  . Drug Use: No  . Sexually Active: Yes -- Male partner(s)   Other Topics Concern  . 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 Married '68, Marriage in good health. 2 sons- '72, '77; 3 grand-daughters.No complaints regarding sexual health. Retired - Therapist, occupational. ACP - does not want heroic or futile measures in the face of loss of function or quality of life.     Current Outpatient Prescriptions on File Prior to Visit  Medication Sig Dispense Refill  . aspirin 81 MG tablet Take 81 mg by mouth  daily.        . doxazosin (CARDURA) 4 MG tablet TAKE 1 TABLET BY MOUTH EVERY DAY  30 tablet  10  . fluticasone (FLONASE) 50 MCG/ACT nasal spray Place 2 sprays into the nose daily.  16 g  11     Vision, hearing, body mass index were assessed and reviewed.   During the course of the visit the patient was educated and counseled about appropriate screening and preventive services including : fall prevention , diabetes screening, nutrition counseling, colorectal cancer screening, and recommended immunizations.    Review of Systems Constitutional:  Negative for fever, chills, activity change and unexpected weight change.  HEENT:  Negative for hearing loss, ear pain, congestion, neck  stiffness and postnasal drip. Negative for sore throat or swallowing problems. Negative for dental complaints.   Eyes: Negative for vision loss or change in visual acuity.  Respiratory: Negative for chest tightness and wheezing. Negative for DOE.   Cardiovascular: Negative for chest pain or palpitations. No decreased exercise tolerance Gastrointestinal: No change in bowel habit. No bloating or gas. No reflux or indigestion Genitourinary: Negative for urgency, frequency, flank pain and difficulty urinating.  Musculoskeletal: Negative for myalgias, back pain, arthralgias and gait problem.  Neurological: Negative for dizziness, tremors, weakness and headaches.  Hematological: Negative for adenopathy.  Psychiatric/Behavioral: Negative for behavioral problems and dysphoric mood.       Objective:   Physical Exam Filed Vitals:   05/13/12 1036  BP: 112/68  Pulse: 53  Temp: 97.4 F (36.3 C)  Resp: 10   Wt Readings from Last 3 Encounters:  05/13/12 180 lb 0.6 oz (81.666 kg)  04/02/12 179 lb (81.194 kg)  04/11/11 176 lb (79.833 kg)   Gen'l: Well nourished well developed white male in no acute distress  HEENT: Head: Normocephalic and atraumatic. Right Ear: External ear normal. EAC/TM nl. Left Ear: External ear normal.  EAC/TM nl. Nose: Nose normal. Mouth/Throat: Oropharynx is clear and moist. Dentition - native, in good repair. No buccal or palatal lesions. Posterior pharynx clear. Eyes: Conjunctivae and sclera clear. EOM intact. Pupils are equal, round, and reactive to light. Right eye exhibits no discharge. Left eye exhibits no discharge. Neck: Normal range of motion. Neck supple. No JVD present. No tracheal deviation present. No thyromegaly present.  Cardiovascular: Normal rate, regular rhythm, no gallop, no friction rub, no murmur heard.      Quiet precordium. 2+ radial and DP pulses . No carotid bruits Pulmonary/Chest: Effort normal. No respiratory distress or increased WOB, no wheezes, no  rales. No chest wall deformity or CVAT. Abdomen: Soft. Bowel sounds are normal in all quadrants. He exhibits no distension, no tenderness, no rebound or guarding, No heptosplenomegaly  Genitourinary:  deferred Musculoskeletal: Normal range of motion. He exhibits no edema and no tenderness.       Small and large joints without redness, synovial thickening or deformity. Full range of motion preserved about all small, median and large joints.  Lymphadenopathy:    He has no cervical or supraclavicular adenopathy.  Neurological: He is alert and oriented to person, place, and time. CN II-XII intact. DTRs 2+ and symmetrical biceps, radial and patellar tendons. Cerebellar function normal with no tremor, rigidity, normal gait and station.  Skin: Skin is warm and dry. No rash noted. No erythema. lesion behind the left ear - mildly erythematous, irregular border, raised and vascular Psychiatric: He has a normal mood and affect. His behavior is normal. Thought content normal.   Lab Results  Component  Value Date   WBC 8.5 03/22/2010   HGB 15.0 03/22/2010   HCT 43.5 03/22/2010   PLT 198.0 03/22/2010   GLUCOSE 103* 05/13/2012   CHOL 194 03/22/2010   TRIG 61.0 03/22/2010   HDL 75.40 03/22/2010   LDLCALC 106* 03/22/2010   ALT 26 05/13/2012   AST 30 05/13/2012   NA 136 05/13/2012   K 4.2 05/13/2012   CL 104 05/13/2012   CREATININE 1.0 05/13/2012   BUN 18 05/13/2012   CO2 26 05/13/2012   TSH 4.35 03/22/2010   PSA 3.38 04/11/2011         Assessment & Plan:

## 2012-05-14 ENCOUNTER — Encounter: Payer: Medicare Other | Admitting: Internal Medicine

## 2012-05-17 NOTE — Assessment & Plan Note (Signed)
BP Readings from Last 3 Encounters:  05/13/12 112/68  04/02/12 124/88  04/11/11 116/86   Excellent control

## 2012-05-17 NOTE — Assessment & Plan Note (Signed)
Interval history  - benighn: no events. Physical exam is normal. Lab results are in normal limits. He is current with colorectal cancer screening and has aged out of continued prostate cancer screening.  Immunizations are up to date.   In summary - a very nice man who is medically stable and doing well. He will return in 1 year or sooner as needed.

## 2012-06-08 ENCOUNTER — Other Ambulatory Visit: Payer: Self-pay | Admitting: Internal Medicine

## 2012-06-10 HISTORY — PX: POLYPECTOMY: SHX149

## 2012-06-10 HISTORY — PX: COLONOSCOPY: SHX174

## 2012-06-24 ENCOUNTER — Ambulatory Visit (INDEPENDENT_AMBULATORY_CARE_PROVIDER_SITE_OTHER): Payer: Medicare Other | Admitting: Internal Medicine

## 2012-06-24 DIAGNOSIS — Z23 Encounter for immunization: Secondary | ICD-10-CM

## 2012-07-01 NOTE — Progress Notes (Signed)
Encounter used for a nurse visit to document the administration of a vaccine.

## 2012-09-07 ENCOUNTER — Encounter: Payer: Self-pay | Admitting: Internal Medicine

## 2012-09-18 ENCOUNTER — Encounter: Payer: Self-pay | Admitting: Internal Medicine

## 2012-10-18 ENCOUNTER — Other Ambulatory Visit: Payer: Self-pay | Admitting: Internal Medicine

## 2013-04-12 ENCOUNTER — Other Ambulatory Visit: Payer: Self-pay

## 2013-04-12 MED ORDER — LISINOPRIL 10 MG PO TABS: 10.0000 mg | ORAL_TABLET | Freq: Every day | ORAL | Status: DC

## 2013-04-12 MED ORDER — DOXAZOSIN MESYLATE 4 MG PO TABS: 4.0000 mg | ORAL_TABLET | Freq: Every day | ORAL | Status: DC

## 2013-06-10 HISTORY — PX: BAND HEMORRHOIDECTOMY: SHX1213

## 2013-07-15 ENCOUNTER — Ambulatory Visit (INDEPENDENT_AMBULATORY_CARE_PROVIDER_SITE_OTHER): Payer: Medicare Other | Admitting: Internal Medicine

## 2013-07-15 ENCOUNTER — Other Ambulatory Visit (INDEPENDENT_AMBULATORY_CARE_PROVIDER_SITE_OTHER): Payer: Medicare Other

## 2013-07-15 ENCOUNTER — Encounter: Payer: Self-pay | Admitting: Internal Medicine

## 2013-07-15 VITALS — BP 120/84 | HR 61 | Temp 97.3°F | Ht 69.5 in | Wt 175.0 lb

## 2013-07-15 DIAGNOSIS — Z23 Encounter for immunization: Secondary | ICD-10-CM

## 2013-07-15 DIAGNOSIS — I10 Essential (primary) hypertension: Secondary | ICD-10-CM

## 2013-07-15 DIAGNOSIS — Z0000 Encounter for general adult medical examination without abnormal findings: Secondary | ICD-10-CM

## 2013-07-15 LAB — LIPID PANEL
CHOL/HDL RATIO: 2
Cholesterol: 176 mg/dL (ref 0–200)
HDL: 74.7 mg/dL (ref >39.00)
LDL Cholesterol: 92 mg/dL (ref 0–99)
Triglycerides: 47 mg/dL (ref 0.0–149.0)
VLDL: 9.4 mg/dL (ref 0.0–40.0)

## 2013-07-15 NOTE — Patient Instructions (Signed)
Thanks for coming in.  Your physical exam is normal.  Labs - ordered and results will be posted to MyChart - please sign up, it should not take more the 7.5 minutes  Immunizations - will give Prevnar 13 pneumonia vaccine - once a done.  You are in great shape. 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

## 2013-07-15 NOTE — Assessment & Plan Note (Signed)
Interval history - benign. Phyiscal exam normal. Lab ordered and pending. He is current with colorectal cancer screening and has aged out of prostate screening. Immunizations - brought up to date with Prevnar 13 given today.  In summary  A delightful man who is medically stable and doing well. He will return in 1 year or sooner prn.

## 2013-07-15 NOTE — Assessment & Plan Note (Signed)
BP Readings from Last 3 Encounters:  07/15/13 120/84  05/13/12 112/68  04/02/12 124/88   Great control. Labs pending  Plan Continue present medications

## 2013-07-15 NOTE — Progress Notes (Signed)
Subjective:    Patient ID: Nathan Reilly, male    DOB: 01-09-1941, 73 y.o.   MRN: [id number]  HPI The patient is here for annual Medicare wellness examination and management of other chronic and acute problems.  He wakes up every morning feeling good and goes to bed feeling good. 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    The risk factors are reflected in the social history.  The roster of all physicians providing medical care to patient - is listed in the Snapshot section of the chart.  Activities of daily living:  The patient is 100% inedpendent in all ADLs: dressing, toileting, feeding as well as independent mobility  Home safety : The patient has smoke detectors in the home. They wear seatbelts. firearms are present in the home, kept in a safe fashion. There is no violence in the home.   There is no risks for hepatitis, STDs or HIV. There is no   history of blood transfusion. They have no travel history to infectious disease endemic areas of the world.  The patient has seen their dentist in the last six month. They have seen their eye doctor in the last year. They deny any hearing difficulty and have not had audiologic testing in the last year.    They do not  have excessive sun exposure. Discussed the need for sun protection: hats, long sleeves and use of sunscreen if there is significant sun exposure.   Diet: the importance of a healthy diet is discussed. They do have a healthy diet.  The patient has a regular exercise program: golf, physical work, 3-4 hours duration, 3 days/ per week.  The benefits of regular aerobic exercise were discussed.  Depression screen: there are no signs or vegative symptoms of depression- irritability, change in appetite, anhedonia, sadness/tearfullness.  Cognitive assessment: the patient manages all their financial and personal affairs and is actively engaged.   The following portions of the patient's history were reviewed and updated as  appropriate: allergies, current medications, past family history, past medical history,  past surgical history, past social history  and problem list.  Vision, hearing, body mass index were assessed and reviewed.   During the course of the visit the patient was educated and counseled about appropriate screening and preventive services including : fall prevention , diabetes screening, nutrition counseling, colorectal cancer screening, and recommended immunizations.  Past Medical History  Diagnosis Date  . Hay fever   . Scarlet fever   . Temporomandibular joint disorders, unspecified   . Hypertension    Past Surgical History  Procedure Laterality Date  . Polypectomy    . Tonsillectomy     Family History  Problem Relation Age of Onset  . Cancer Father   . Diabetes Father   . Cancer Sister     breast  . Hypertension Neg Hx   . Heart disease Neg Hx    History   Social History  . Marital Status: Married    Spouse Name: N/A    Number of Children: 2  . Years of Education: 16   Occupational History  . sales     retired   Social History Main Topics  . Smoking status: Never Smoker   . Smokeless tobacco: Never Used  . Alcohol Use: 3.0 oz/week    6 drink(s) per week  . Drug Use: No  . Sexual Activity: Yes    Partners: Female   Other Topics Concern  .  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 Married '68, Marriage in good health. 2 sons- '72, '77; 3 grand-daughters.No complaints regarding sexual health. Retired - Orthoptist. ACP - does not want heroic or futile measures in the face of loss of function or quality of life.           Current Outpatient Prescriptions on File Prior to Visit  Medication Sig Dispense Refill  . aspirin 81 MG tablet Take 81 mg by mouth daily.        Marland Kitchen doxazosin (CARDURA) 4 MG tablet Take 1 tablet (4 mg total) by  mouth daily.  90 tablet  3  . fluticasone (FLONASE) 50 MCG/ACT nasal spray USE 2 SPRAYS IN EACH NOSTRIL EVERY DAY  16 g  5  . lisinopril (PRINIVIL,ZESTRIL) 10 MG tablet Take 1 tablet (10 mg total) by mouth daily.  90 tablet  3  . sildenafil (VIAGRA) 100 MG tablet Take 0.5-1 tablets (50-100 mg total) by mouth daily as needed for erectile dysfunction.  6 tablet  11   No current facility-administered medications on file prior to visit.      Review of Systems Constitutional:  Negative for fever, chills, activity change and unexpected weight change.  HEENT:  Negative for hearing loss, ear pain, congestion, neck stiffness and postnasal drip. Negative for sore throat or swallowing problems. Negative for dental complaints.   Eyes: Negative for vision loss or change in visual acuity.  Respiratory: Negative for chest tightness and wheezing. Negative for DOE.   Cardiovascular: Negative for chest pain or palpitations. No decreased exercise tolerance Gastrointestinal: No change in bowel habit. No bloating or gas. No reflux or indigestion Genitourinary: Negative for urgency, frequency, flank pain and difficulty urinating.  Musculoskeletal: Negative for myalgias, back pain, arthralgias and gait problem.  Neurological: Negative for dizziness, tremors, weakness and headaches.  Hematological: Negative for adenopathy.  Psychiatric/Behavioral: Negative for behavioral problems and dysphoric mood.       Objective:   Physical Exam Filed Vitals:   07/15/13 0854  BP: 120/84  Pulse: 61  Temp: 97.3 F (36.3 C)   Wt Readings from Last 3 Encounters:  07/15/13 175 lb (79.379 kg)  05/13/12 180 lb 0.6 oz (81.666 kg)  04/02/12 179 lb (81.194 kg)   Gen'l: Well nourished well developed  male in no acute distress  HEENT: Head: Normocephalic and atraumatic. Right Ear: External ear normal. EAC/TM nl. Left Ear: External ear normal.  EAC/TM nl. Nose: Nose normal. Mouth/Throat: Oropharynx is clear and moist.  Dentition - native, in good repair. No buccal or palatal lesions. Posterior pharynx clear. Eyes: Conjunctivae and sclera clear. EOM intact. Pupils are equal, round, and reactive to light. Right eye exhibits no discharge. Left eye exhibits no discharge. Neck: Normal range of motion. Neck supple. No JVD present. No tracheal deviation present. No thyromegaly present.  Cardiovascular: Normal rate, regular rhythm, no gallop, no friction rub, no murmur heard.      Quiet precordium. 2+ radial and DP pulses . No carotid bruits Pulmonary/Chest: Effort normal. No respiratory distress or increased WOB, no wheezes, no rales. No chest wall deformity or CVAT. Abdomen: Soft. Bowel sounds are normal in all quadrants. He exhibits no distension, no tenderness, no rebound or guarding, No heptosplenomegaly  Genitourinary: deferred Musculoskeletal: Normal range of motion. He exhibits no edema and no tenderness.       Small  and large joints without redness, synovial thickening or deformity. Full range of motion preserved about all small, median and large joints.  Lymphadenopathy:    He has no cervical or supraclavicular adenopathy.  Neurological: He is alert and oriented to person, place, and time. CN II-XII intact. DTRs 2+ and symmetrical biceps, radial and patellar tendons. Cerebellar function normal with no tremor, rigidity, normal gait and station.  Skin: Skin is warm and dry. No rash noted. No erythema.  Psychiatric: He has a normal mood and affect. His behavior is normal. Thought content normal.         Assessment & Plan:

## 2013-07-15 NOTE — Progress Notes (Signed)
Pre visit review using our clinic review tool, if applicable. No additional management support is needed unless otherwise documented below in the visit note. 

## 2013-07-16 ENCOUNTER — Encounter: Payer: Self-pay | Admitting: Internal Medicine

## 2013-09-03 ENCOUNTER — Other Ambulatory Visit: Payer: Self-pay | Admitting: Internal Medicine

## 2013-10-07 ENCOUNTER — Other Ambulatory Visit: Payer: Self-pay | Admitting: Orthopedic Surgery

## 2013-10-07 DIAGNOSIS — R2230 Localized swelling, mass and lump, unspecified upper limb: Secondary | ICD-10-CM

## 2013-10-08 ENCOUNTER — Other Ambulatory Visit: Payer: Self-pay

## 2013-10-08 MED ORDER — FLUTICASONE PROPIONATE 50 MCG/ACT NA SUSP: 2.0000 | Freq: Every day | NASAL | Status: DC

## 2013-10-13 ENCOUNTER — Ambulatory Visit
Admission: RE | Admit: 2013-10-13 | Discharge: 2013-10-13 | Disposition: A | Discharge status: Home / Self Care | Payer: Medicare Other | Source: Ambulatory Visit | Attending: Orthopedic Surgery | Admitting: Orthopedic Surgery

## 2013-10-13 DIAGNOSIS — R2230 Localized swelling, mass and lump, unspecified upper limb: Secondary | ICD-10-CM

## 2014-03-08 ENCOUNTER — Telehealth: Payer: Self-pay | Admitting: Internal Medicine

## 2014-03-08 ENCOUNTER — Other Ambulatory Visit: Payer: Self-pay | Admitting: Geriatric Medicine

## 2014-03-08 MED ORDER — SILDENAFIL CITRATE 100 MG PO TABS: ORAL_TABLET | ORAL | Status: DC

## 2014-03-08 NOTE — Telephone Encounter (Signed)
Pt is transferring to Dr. Doug Sou in Feb 2016.  Former [facility] pt.  Pt is requesting a refill on Viagra.

## 2014-03-31 ENCOUNTER — Other Ambulatory Visit: Payer: Self-pay | Admitting: Sports Medicine

## 2014-03-31 DIAGNOSIS — M25562 Pain in left knee: Secondary | ICD-10-CM

## 2014-04-01 ENCOUNTER — Other Ambulatory Visit: Payer: Medicare Other

## 2014-04-06 ENCOUNTER — Other Ambulatory Visit: Payer: Medicare Other

## 2014-05-11 ENCOUNTER — Other Ambulatory Visit: Payer: Self-pay

## 2014-05-11 MED ORDER — DOXAZOSIN MESYLATE 4 MG PO TABS: 4.0000 mg | ORAL_TABLET | Freq: Every day | ORAL | Status: DC

## 2014-05-11 MED ORDER — LISINOPRIL 10 MG PO TABS: 10.0000 mg | ORAL_TABLET | Freq: Every day | ORAL | Status: DC

## 2014-06-14 DIAGNOSIS — M1712 Unilateral primary osteoarthritis, left knee: Secondary | ICD-10-CM | POA: Diagnosis not present

## 2014-06-27 ENCOUNTER — Telehealth: Payer: Self-pay | Admitting: Internal Medicine

## 2014-06-27 NOTE — Telephone Encounter (Signed)
-----   Message from Hendricks Limes, MD sent at 06/26/2014  1:42 PM EST ----- Please schedule preop clearance appointment . Tentative surgery date is 2/16

## 2014-06-27 NOTE — Telephone Encounter (Signed)
Called pt made appt 1/20 at 9am Dr hopper

## 2014-06-29 ENCOUNTER — Other Ambulatory Visit (INDEPENDENT_AMBULATORY_CARE_PROVIDER_SITE_OTHER): Payer: Medicare Other

## 2014-06-29 ENCOUNTER — Encounter: Payer: Self-pay | Admitting: Internal Medicine

## 2014-06-29 ENCOUNTER — Ambulatory Visit (INDEPENDENT_AMBULATORY_CARE_PROVIDER_SITE_OTHER): Payer: Medicare Other | Admitting: Internal Medicine

## 2014-06-29 VITALS — BP 120/86 | HR 58 | Temp 98.0°F | Resp 17 | Ht 69.0 in | Wt 181.4 lb

## 2014-06-29 DIAGNOSIS — I10 Essential (primary) hypertension: Secondary | ICD-10-CM | POA: Diagnosis not present

## 2014-06-29 DIAGNOSIS — Z86010 Personal history of colonic polyps: Secondary | ICD-10-CM | POA: Diagnosis not present

## 2014-06-29 DIAGNOSIS — N429 Disorder of prostate, unspecified: Secondary | ICD-10-CM

## 2014-06-29 DIAGNOSIS — M1712 Unilateral primary osteoarthritis, left knee: Secondary | ICD-10-CM | POA: Diagnosis not present

## 2014-06-29 DIAGNOSIS — Z85828 Personal history of other malignant neoplasm of skin: Secondary | ICD-10-CM | POA: Insufficient documentation

## 2014-06-29 LAB — BASIC METABOLIC PANEL
BUN: 21 mg/dL (ref 6–23)
CALCIUM: 9.4 mg/dL (ref 8.4–10.5)
CO2: 30 mEq/L (ref 19–32)
CREATININE: 1.16 mg/dL (ref 0.40–1.50)
Chloride: 105 mEq/L (ref 96–112)
GFR: 65.44 mL/min (ref >60.00)
GLUCOSE: 91 mg/dL (ref 70–99)
Potassium: 4.8 mEq/L (ref 3.5–5.1)
SODIUM: 138 meq/L (ref 135–145)

## 2014-06-29 LAB — PSA: PSA: 1.98 ng/mL (ref 0.10–4.00)

## 2014-06-29 NOTE — Assessment & Plan Note (Signed)
Colonoscopy 2019.  

## 2014-06-29 NOTE — Progress Notes (Signed)
Pre visit review using our clinic review tool, if applicable. No additional management support is needed unless otherwise documented below in the visit note. 

## 2014-06-29 NOTE — Assessment & Plan Note (Signed)
Annual Derm OV

## 2014-06-29 NOTE — Patient Instructions (Signed)

## 2014-06-29 NOTE — Progress Notes (Signed)
Subjective:    Patient ID: Nathan Reilly, male    DOB: [birth year], 74 y.o.   MRN: [id number]  HPI He is scheduled for left total knee replacement 07/26/14 by Dr. Alvan Dame. 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 With cortisone injection he had a 48-hour or less response . Rooster comb injections were only partially of benefit  He has pain with lateral movements described as "pulsating". He also has weakness in the leg.  He has been compliant with his medications for blood pressure. He has no adverse effects. He does not add salt to his food. Blood pressures average in the 120s over 80s.  Despite the knee issues he has been physically active and also goes the gym once a week. He denies any associated cardiopulmonary symptoms  He had a colon polyp removed in 2014. Apparently this was an adenoma as recall is in 2019. Has no active GI symptoms  His PSA has shown a gradual rise; it was last checked in 2012 with a value of 3.38. He has no significant genitourinary or lower urinary tract symptoms except for nocturia 1.  The past medical history, family history, social history were reviewed and updated.   Review of Systems   Chest pain, palpitations, tachycardia, exertional dyspnea, paroxysmal nocturnal dyspnea, claudication or edema are absent.  Unexplained weight loss, abdominal pain, significant dyspepsia, dysphagia, melena, rectal bleeding, or persistently small caliber stools are denied. Dysuria, pyuria, hematuria, frequency, or polyuria are denied.    Objective:   Physical Exam Positive or pertinent findings include: He has minor DIP changes of the fifth digits bilaterally. There is fusiform enlargement and ballotable fluid of the left knee. There is mild crepitus and decreased range of motion. The prostate is asymmetrically enlarged. Right lobe is larger than the left and slightly indurated. No nodules are palpable.Large internal hemorrhoid on left  Gen.: Healthy and  well-nourished in appearance. Alert, appropriate and cooperative throughout exam. Appears younger than stated age  Head: Normocephalic without obvious abnormalities  Eyes: No corneal or conjunctival inflammation noted. Pupils equal round reactive to light and accommodation. Extraocular motion intact.  Ears: External  ear exam reveals no significant lesions or deformities. Canals clear .TMs normal. Hearing is grossly normal bilaterally. Nose: External nasal exam reveals no deformity or inflammation. Nasal mucosa are pink and moist. No lesions or exudates noted.   Mouth: Oral mucosa and oropharynx reveal no lesions or exudates. Teeth in good repair. Neck: No deformities, masses, or tenderness noted. Range of motion & Thyroid normal. Lungs: Normal respiratory effort; chest expands symmetrically. Lungs are clear to auscultation without rales, wheezes, or increased work of breathing. Heart: Normal rate and rhythm. Normal S1 and S2. No gallop, click, or rub. No murmur. Abdomen: Bowel sounds normal; abdomen soft and nontender. No masses, organomegaly or hernias noted. Genitalia: Genitalia normal except for left varices.  Musculoskeletal/extremities: No deformity or scoliosis noted of  the thoracic or lumbar spine.  No clubbing, cyanosis, edema, or significant extremity  deformity noted.  Tone & strength normal.  Fingernail health good. Able to lie down & sit up w/o help.  Negative SLR bilaterally Vascular: Carotid, radial artery, dorsalis pedis and  posterior tibial pulses are full and equal. No bruits present. Neurologic: Alert and oriented x3. Deep tendon reflexes symmetric in UE ; not tested in LE Gait normal      Skin: Intact without suspicious lesions or rashes. Lymph: No cervical, axillary, or inguinal lymphadenopathy present. Psych: Mood and affect are  normal. Normally interactive                                                                                      Assessment & Plan:    See Current Assessment & Plan in Problem List under specific Diagnosis

## 2014-07-08 DIAGNOSIS — H02115 Cicatricial ectropion of left lower eyelid: Secondary | ICD-10-CM | POA: Diagnosis not present

## 2014-07-08 DIAGNOSIS — H02015 Cicatricial entropion of left lower eyelid: Secondary | ICD-10-CM | POA: Diagnosis not present

## 2014-07-13 NOTE — H&P (Signed)
TOTAL KNEE ADMISSION H&P  Patient is being admitted for left total knee arthroplasty.  Subjective:  Chief Complaint:   Left knee primary OA / pain.  HPI: Nathan Reilly, 74 y.o. male, has a history of pain and functional disability in the left knee due to arthritis and has failed non-surgical conservative treatments for greater than 12 weeks to include NSAID's and/or analgesics, corticosteriod injections, viscosupplementation injections and activity modification.  Onset of symptoms was gradual, starting ~1 years ago with gradually worsening course since that time. The patient noted no past surgery on the left knee(s).  Patient currently rates pain in the left knee(s) at 9 out of 10 with activity. Patient has night pain, worsening of pain with activity and weight bearing, pain that interferes with activities of daily living and pain with passive range of motion.  Patient has evidence of periarticular osteophytes and joint space narrowing by imaging studies.  There is no active infection.  Risks, benefits and expectations were discussed with the patient.  Risks including but not limited to the risk of anesthesia, blood clots, nerve damage, blood vessel damage, failure of the prosthesis, infection and up to and including death.  Patient understand the risks, benefits and expectations and wishes to proceed with surgery.   PCP: Adella Hare, MD  D/C Plans:      Home with HHPT / SNF  Post-op Meds:       No Rx given   Tranexamic Acid:      To be given - IV   Decadron:      Is to be given  FYI:     ASA post-op  Norco post-op    Patient Active Problem List   Diagnosis Date Noted  . Hx of skin cancer, basal cell 06/29/2014  . Left knee DJD 06/29/2014  . Routine health maintenance 04/12/2011  . SCARLET FEVER 07/17/2007  . Essential hypertension 07/17/2007  . 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 History of colonic polyps 07/17/2007   Past Medical History   Diagnosis Date  . Hay fever   . Scarlet fever   . Temporomandibular joint disorders, unspecified   . Hypertension     Past Surgical History  Procedure Laterality Date  . Polypectomy  2014    Dr Earlean Shawl  . Tonsillectomy    . Band hemorrhoidectomy  2015    Dr Earlean Shawl    No prescriptions prior to admission   No Known Allergies   History  Substance Use Topics  . Smoking status: Never Smoker   . Smokeless tobacco: Never Used  . Alcohol Use: 7.2 oz/week    6 Not specified, 6 Cans of beer per week    Family History  Problem Relation Age of Onset  . Diabetes Father   . Breast cancer Sister   . Hypertension Neg Hx   . Heart disease Neg Hx   . Stroke Neg Hx      Review of Systems  Constitutional: Negative.   HENT: Negative.   Eyes: Negative.   Respiratory: Negative.   Cardiovascular: Negative.   Gastrointestinal: Positive for heartburn.  Genitourinary: Negative.   Musculoskeletal: Positive for joint pain.  Skin: Negative.   Neurological: Negative.   Endo/Heme/Allergies: Positive for environmental allergies.  Psychiatric/Behavioral: Negative.     Objective:  Physical Exam  Constitutional: He is oriented to person, place, and time. He appears well-developed and well-nourished.  HENT:  Head: Normocephalic and atraumatic.  Eyes: Pupils are equal, round, and reactive  to light.  Neck: Neck supple. No JVD present. No tracheal deviation present. No thyromegaly present.  Cardiovascular: Normal rate, regular rhythm, normal heart sounds and intact distal pulses.   Respiratory: Effort normal and breath sounds normal. No stridor. No respiratory distress. He has no wheezes.  GI: Soft. There is no tenderness. There is no guarding.  Musculoskeletal:       Left knee: He exhibits decreased range of motion, swelling and bony tenderness. He exhibits no ecchymosis, no deformity, no laceration, no erythema and normal alignment. Tenderness found.  Lymphadenopathy:    He has no cervical  adenopathy.  Neurological: He is alert and oriented to person, place, and time.  Skin: Skin is warm and dry.  Psychiatric: He has a normal mood and affect.      Labs:  Estimated body mass index is 25.48 kg/(m^2) as calculated from the following:   Height as of 07/15/13: 5' 9.5" (1.765 m).   Weight as of 07/15/13: 79.379 kg (175 lb).   Imaging Review Plain radiographs demonstrate severe degenerative joint disease of the left knee(s). The overall alignment is  neutral. The bone quality appears to be good for age and reported activity level.  Assessment/Plan:  End stage arthritis, left knee   The patient history, physical examination, clinical judgment of the provider and imaging studies are consistent with end stage degenerative joint disease of the left knee(s) and total knee arthroplasty is deemed medically necessary. The treatment options including medical management, injection therapy arthroscopy and arthroplasty were discussed at length. The risks and benefits of total knee arthroplasty were presented and reviewed. The risks due to aseptic loosening, infection, stiffness, patella tracking problems, thromboembolic complications and other imponderables were discussed. The patient acknowledged the explanation, agreed to proceed with the plan and consent was signed. Patient is being admitted for inpatient treatment for surgery, pain control, PT, OT, prophylactic antibiotics, VTE prophylaxis, progressive ambulation and ADL's and discharge planning. The patient is planning to be discharged to skilled nursing facility / home.     West Pugh Jalonda Antigua   PA-C  07/13/2014, 5:05 PM

## 2014-07-19 ENCOUNTER — Encounter (HOSPITAL_COMMUNITY): Payer: Self-pay

## 2014-07-19 NOTE — Patient Instructions (Addendum)
Nathan Reilly  07/19/2014   Your procedure is scheduled on:07/26/14   Report to [facility] Main  Entrance and follow signs to               [place] at 8:30 AM.   Call this number if you have problems the morning of surgery [phone]   Remember:  Do not eat food or drink liquids :After Midnight.     Take these medicines the morning of surgery with A SIP OF WATER:   NONE                               You may not have any metal on your body including hair pins and              piercings  Do not wear jewelry, make-up, lotions, powders or perfumes.             Do not wear nail polish.  Do not shave  48 hours prior to surgery.              Men may shave face and neck.   Do not bring valuables to the hospital. [place].  Contacts, dentures or bridgework may not be worn into surgery.  Leave suitcase in the car. After surgery it may be brought to your room.     Patients discharged the day of surgery will not be allowed to drive home.  Name and phone number of your driver:  Special Instructions: N/A              Please read over the following fact sheets you were given: _____________________________________________________________________                                                     [place]  Before surgery, you can play an important role.  Because skin is not sterile, your skin needs to be as free of germs as possible.  You can reduce the number of germs on your skin by washing with CHG (chlorahexidine gluconate) soap before surgery.  CHG is an antiseptic cleaner which kills germs and bonds with the skin to continue killing germs even after washing. Please DO NOT use if you have an allergy to CHG or antibacterial soaps.  If your skin becomes reddened/irritated stop using the CHG and inform your nurse when you arrive at Short Stay. Do not shave (including legs and  underarms) for at least 48 hours prior to the first CHG shower.  You may shave your face. Please follow these instructions carefully:   1.  Shower with CHG Soap the night before surgery and the  morning of Surgery.   2.  If you choose to wash your hair, wash your hair first as usual with your  normal  Shampoo.   3.  After you shampoo, rinse your hair and body thoroughly to remove the  shampoo.  4.  Use CHG as you would any other liquid soap.  You can apply chg directly  to the skin and wash . Gently wash with scrungie or clean wascloth    5.  Apply the CHG Soap to your body ONLY FROM THE NECK DOWN.   Do not use on open                           Wound or open sores. Avoid contact with eyes, ears mouth and genitals (private parts).                        Genitals (private parts) with your normal soap.              6.  Wash thoroughly, paying special attention to the area where your surgery  will be performed.   7.  Thoroughly rinse your body with warm water from the neck down.   8.  DO NOT shower/wash with your normal soap after using and rinsing off  the CHG Soap .                9.  Pat yourself dry with a clean towel.             10.  Wear clean pajamas.             11.  Place clean sheets on your bed the night of your first shower and do not  sleep with pets.  Day of Surgery : Do not apply any lotions/deodorants the morning of surgery.  Please wear clean clothes to the hospital/surgery center.  FAILURE TO FOLLOW THESE INSTRUCTIONS MAY RESULT IN THE CANCELLATION OF YOUR SURGERY    PATIENT SIGNATURE_________________________________  ______________________________________________________________________     [name]  An incentive spirometer is a tool that can help keep your lungs clear and active. This tool measures how well you are filling your lungs with each breath. Taking long deep breaths may help reverse or decrease  the chance of developing breathing (pulmonary) problems (especially infection) following:  A long period of time when you are unable to move or be active. BEFORE THE PROCEDURE   If the spirometer includes an indicator to show your best effort, your nurse or respiratory therapist will set it to a desired goal.  If possible, sit up straight or lean slightly forward. Try not to slouch.  Hold the incentive spirometer in an upright position. INSTRUCTIONS FOR USE   Sit on the edge of your bed if possible, or sit up as far as you can in bed or on a chair.  Hold the incentive spirometer in an upright position.  Breathe out normally.  Place the mouthpiece in your mouth and seal your lips tightly around it.  Breathe in slowly and as deeply as possible, raising the piston or the ball toward the top of the column.  Hold your breath for 3-5 seconds or for as long as possible. Allow the piston or ball to fall to the bottom of the column.  Remove the mouthpiece from your mouth and breathe out normally.  Rest for a few seconds and repeat Steps 1 through 7 at least 10 times every 1-2 hours when you are awake. Take your time and take a few normal breaths between deep breaths.  The spirometer may include an indicator to show your best effort. Use the indicator as a goal to work toward during  each repetition.  After each set of 10 deep breaths, practice coughing to be sure your lungs are clear. If you have an incision (the cut made at the time of surgery), support your incision when coughing by placing a pillow or rolled up towels firmly against it. Once you are able to get out of bed, walk around indoors and cough well. You may stop using the incentive spirometer when instructed by your caregiver.  RISKS AND COMPLICATIONS  Take your time so you do not get dizzy or light-headed.  If you are in pain, you may need to take or ask for pain medication before doing incentive spirometry. It is harder to  take a deep breath if you are having pain. AFTER USE  Rest and breathe slowly and easily.  It can be helpful to keep track of a log of your progress. Your caregiver can provide you with a simple table to help with this. If you are using the spirometer at home, follow these instructions: [place] IF:   You are having difficultly using the spirometer.  You have trouble using the spirometer as often as instructed.  Your pain medication is not giving enough relief while using the spirometer.  You develop fever of 100.5 F (38.1 C) or higher. SEEK IMMEDIATE MEDICAL CARE IF:   You cough up bloody sputum that had not been present before.  You develop fever of 102 F (38.9 C) or greater.  You develop worsening pain at or near the incision site. MAKE SURE YOU:   Understand these instructions.  Will watch your condition.  Will get help right away if you are not doing well or get worse. Document Released: 10/07/2006 Document Revised: 08/19/2011 Document Reviewed: 12/08/2006 ExitCare Patient Information 2014 ExitCare, [place].   ________________________________________________________________________  WHAT IS A BLOOD TRANSFUSION? Blood Transfusion Information  A transfusion is the replacement of blood or some of its parts. Blood is made up of multiple cells which provide different functions.  Red blood cells carry oxygen and are used for blood loss replacement.  White blood cells fight against infection.  Platelets control bleeding.  Plasma helps clot blood.  Other blood products are available for specialized needs, such as hemophilia or other clotting disorders. BEFORE THE TRANSFUSION  Who gives blood for transfusions?   Healthy volunteers who are fully evaluated to make sure their blood is safe. This is blood bank blood. Transfusion therapy is the safest it has ever been in the practice of medicine. Before blood is taken from a donor, a complete history is taken to  make sure that person has no history of diseases nor engages in risky social behavior (examples are intravenous drug use or sexual activity with multiple partners). The donor's travel history is screened to minimize risk of transmitting infections, such as malaria. The donated blood is tested for signs of infectious diseases, such as HIV and hepatitis. The blood is then tested to be sure it is compatible with you in order to minimize the chance of a transfusion reaction. If you or a relative donates blood, this is often done in anticipation of surgery and is not appropriate for emergency situations. It takes many days to process the donated blood. RISKS AND COMPLICATIONS Although transfusion therapy is very safe and saves many lives, the main dangers of transfusion include:   Getting an infectious disease.  Developing a transfusion reaction. This is an allergic reaction to something in the blood you were given. Every precaution is taken to prevent  this. The decision to have a blood transfusion has been considered carefully by your caregiver before blood is given. Blood is not given unless the benefits outweigh the risks. AFTER THE TRANSFUSION  Right after receiving a blood transfusion, you will usually feel much better and more energetic. This is especially true if your red blood cells have gotten low (anemic). The transfusion raises the level of the red blood cells which carry oxygen, and this usually causes an energy increase.  The nurse administering the transfusion will monitor you carefully for complications. HOME CARE INSTRUCTIONS  No special instructions are needed after a transfusion. You may find your energy is better. Speak with your caregiver about any limitations on activity for underlying diseases you may have. SEEK MEDICAL CARE IF:   Your condition is not improving after your transfusion.  You develop redness or irritation at the intravenous (IV) site. SEEK IMMEDIATE MEDICAL CARE  IF:  Any of the following symptoms occur over the next 12 hours:  Shaking chills.  You have a temperature by mouth above 102 F (38.9 C), not controlled by medicine.  Chest, back, or muscle pain.  People around you feel you are not acting correctly or are confused.  Shortness of breath or difficulty breathing.  Dizziness and fainting.  You get a rash or develop hives.  You have a decrease in urine output.  Your urine turns a dark color or changes to pink, red, or brown. Any of the following symptoms occur over the next 10 days:  You have a temperature by mouth above 102 F (38.9 C), not controlled by medicine.  Shortness of breath.  Weakness after normal activity.  The white part of the eye turns yellow (jaundice).  You have a decrease in the amount of urine or are urinating less often.  Your urine turns a dark color or changes to pink, red, or brown. Document Released: 05/24/2000 Document Revised: 08/19/2011 Document Reviewed: 01/11/2008 [facility] Patient Information 2014 [place], [place].  _______________________________________________________________________

## 2014-07-20 ENCOUNTER — Encounter (HOSPITAL_COMMUNITY): Payer: Self-pay

## 2014-07-20 ENCOUNTER — Encounter (HOSPITAL_COMMUNITY)
Admission: RE | Admit: 2014-07-20 | Discharge: 2014-07-20 | Disposition: A | Discharge status: Home / Self Care | Payer: Medicare Other | Source: Ambulatory Visit | Attending: Orthopedic Surgery | Admitting: Orthopedic Surgery

## 2014-07-20 ENCOUNTER — Encounter: Payer: Medicare Other | Admitting: Internal Medicine

## 2014-07-20 DIAGNOSIS — I10 Essential (primary) hypertension: Secondary | ICD-10-CM | POA: Insufficient documentation

## 2014-07-20 DIAGNOSIS — Z0183 Encounter for blood typing: Secondary | ICD-10-CM | POA: Insufficient documentation

## 2014-07-20 DIAGNOSIS — M179 Osteoarthritis of knee, unspecified: Secondary | ICD-10-CM | POA: Diagnosis not present

## 2014-07-20 DIAGNOSIS — Z01812 Encounter for preprocedural laboratory examination: Secondary | ICD-10-CM | POA: Insufficient documentation

## 2014-07-20 HISTORY — DX: Benign prostatic hyperplasia without lower urinary tract symptoms: N40.0

## 2014-07-20 HISTORY — DX: Unspecified osteoarthritis, unspecified site: M19.90

## 2014-07-20 HISTORY — DX: Gastro-esophageal reflux disease without esophagitis: K21.9

## 2014-07-20 HISTORY — DX: Personal history of other malignant neoplasm of skin: Z85.828

## 2014-07-20 HISTORY — DX: Malignant (primary) neoplasm, unspecified: C80.1

## 2014-07-20 LAB — BASIC METABOLIC PANEL
ANION GAP: 6 (ref 5–15)
BUN: 15 mg/dL (ref 6–23)
CO2: 27 mmol/L (ref 19–32)
CREATININE: 1.07 mg/dL (ref 0.50–1.35)
Calcium: 9.3 mg/dL (ref 8.4–10.5)
Chloride: 107 mmol/L (ref 96–112)
GFR calc non Af Amer: 67 mL/min — ABNORMAL LOW (ref >90)
GFR, EST AFRICAN AMERICAN: 77 mL/min — AB (ref >90)
Glucose, Bld: 85 mg/dL (ref 70–99)
Potassium: 4 mmol/L (ref 3.5–5.1)
SODIUM: 140 mmol/L (ref 135–145)

## 2014-07-20 LAB — CBC
HEMATOCRIT: 44.5 % (ref 39.0–52.0)
Hemoglobin: 14.7 g/dL (ref 13.0–17.0)
MCH: 30.4 pg (ref 26.0–34.0)
MCHC: 33 g/dL (ref 30.0–36.0)
MCV: 92.1 fL (ref 78.0–100.0)
Platelets: 155 10*3/uL (ref 150–400)
RBC: 4.83 MIL/uL (ref 4.22–5.81)
RDW: 13.1 % (ref 11.5–15.5)
WBC: 9.1 10*3/uL (ref 4.0–10.5)

## 2014-07-20 LAB — URINALYSIS, ROUTINE W REFLEX MICROSCOPIC
BILIRUBIN URINE: NEGATIVE
Glucose, UA: NEGATIVE mg/dL
Hgb urine dipstick: NEGATIVE
Ketones, ur: NEGATIVE mg/dL
Leukocytes, UA: NEGATIVE
Nitrite: NEGATIVE
Protein, ur: NEGATIVE mg/dL
SPECIFIC GRAVITY, URINE: 1.015 (ref 1.005–1.030)
UROBILINOGEN UA: 0.2 mg/dL (ref 0.0–1.0)
pH: 6 (ref 5.0–8.0)

## 2014-07-20 LAB — PROTIME-INR
INR: 1.05 (ref 0.00–1.49)
Prothrombin Time: 13.8 seconds (ref 11.6–15.2)

## 2014-07-20 LAB — SURGICAL PCR SCREEN
MRSA, PCR: NEGATIVE
Staphylococcus aureus: NEGATIVE

## 2014-07-20 LAB — APTT: aPTT: 29 seconds (ref 24–37)

## 2014-07-20 LAB — ABO/RH: ABO/RH(D): A POS

## 2014-07-26 ENCOUNTER — Encounter (HOSPITAL_COMMUNITY)
Admission: RE | Disposition: A | Discharge status: Home Health | Payer: Self-pay | Source: Ambulatory Visit | Attending: Orthopedic Surgery

## 2014-07-26 ENCOUNTER — Inpatient Hospital Stay (HOSPITAL_COMMUNITY): Payer: Medicare Other | Admitting: Anesthesiology

## 2014-07-26 ENCOUNTER — Encounter (HOSPITAL_COMMUNITY): Payer: Self-pay | Admitting: *Deleted

## 2014-07-26 ENCOUNTER — Inpatient Hospital Stay (HOSPITAL_COMMUNITY)
Admission: RE | Admit: 2014-07-26 | Discharge: 2014-07-28 | DRG: 470 | Disposition: A | Discharge status: Home Health | Payer: Medicare Other | Source: Ambulatory Visit | Attending: Orthopedic Surgery | Admitting: Orthopedic Surgery

## 2014-07-26 DIAGNOSIS — Z6825 Body mass index (BMI) 25.0-25.9, adult: Secondary | ICD-10-CM

## 2014-07-26 DIAGNOSIS — M25562 Pain in left knee: Secondary | ICD-10-CM | POA: Diagnosis present

## 2014-07-26 DIAGNOSIS — E663 Overweight: Secondary | ICD-10-CM | POA: Diagnosis present

## 2014-07-26 DIAGNOSIS — M659 Synovitis and tenosynovitis, unspecified: Secondary | ICD-10-CM | POA: Diagnosis present

## 2014-07-26 DIAGNOSIS — M1712 Unilateral primary osteoarthritis, left knee: Principal | ICD-10-CM | POA: Diagnosis present

## 2014-07-26 DIAGNOSIS — I10 Essential (primary) hypertension: Secondary | ICD-10-CM | POA: Diagnosis present

## 2014-07-26 DIAGNOSIS — Z96652 Presence of left artificial knee joint: Secondary | ICD-10-CM

## 2014-07-26 DIAGNOSIS — Z96659 Presence of unspecified artificial knee joint: Secondary | ICD-10-CM

## 2014-07-26 DIAGNOSIS — M179 Osteoarthritis of knee, unspecified: Secondary | ICD-10-CM | POA: Diagnosis not present

## 2014-07-26 DIAGNOSIS — Z01812 Encounter for preprocedural laboratory examination: Secondary | ICD-10-CM

## 2014-07-26 DIAGNOSIS — K219 Gastro-esophageal reflux disease without esophagitis: Secondary | ICD-10-CM | POA: Diagnosis not present

## 2014-07-26 HISTORY — PX: TOTAL KNEE ARTHROPLASTY: SHX125

## 2014-07-26 LAB — TYPE AND SCREEN
ABO/RH(D): A POS
Antibody Screen: NEGATIVE

## 2014-07-26 SURGERY — ARTHROPLASTY, KNEE, TOTAL
Anesthesia: Monitor Anesthesia Care | Site: Knee | Laterality: Left

## 2014-07-26 MED ORDER — BUPIVACAINE-EPINEPHRINE (PF) 0.25% -1:200000 IJ SOLN
INTRAMUSCULAR | Status: AC
  Filled 2014-07-26: qty 30

## 2014-07-26 MED ORDER — SODIUM CHLORIDE 0.9 % IJ SOLN
INTRAMUSCULAR | Status: AC
  Filled 2014-07-26: qty 50

## 2014-07-26 MED ORDER — CEFAZOLIN SODIUM-DEXTROSE 2-3 GM-% IV SOLR
INTRAVENOUS | Status: AC
  Filled 2014-07-26: qty 50

## 2014-07-26 MED ORDER — ONDANSETRON HCL 4 MG/2ML IJ SOLN
4.0000 mg | Freq: Four times a day (QID) | INTRAMUSCULAR | Status: DC | PRN
  Administered 2014-07-28: 4 mg via INTRAVENOUS
  Filled 2014-07-26: qty 2

## 2014-07-26 MED ORDER — EPHEDRINE SULFATE 50 MG/ML IJ SOLN
INTRAMUSCULAR | Status: DC | PRN
  Administered 2014-07-26: 5 mg via INTRAVENOUS

## 2014-07-26 MED ORDER — CEFAZOLIN SODIUM-DEXTROSE 2-3 GM-% IV SOLR
2.0000 g | INTRAVENOUS | Status: AC
  Administered 2014-07-26: 2 g via INTRAVENOUS

## 2014-07-26 MED ORDER — HYDROMORPHONE HCL 1 MG/ML IJ SOLN: 0.5000 mg | INTRAMUSCULAR | Status: DC | PRN

## 2014-07-26 MED ORDER — BUPIVACAINE-EPINEPHRINE (PF) 0.25% -1:200000 IJ SOLN
INTRAMUSCULAR | Status: DC | PRN
  Administered 2014-07-26: 30 mL

## 2014-07-26 MED ORDER — METOCLOPRAMIDE HCL 10 MG PO TABS: 5.0000 mg | ORAL_TABLET | Freq: Three times a day (TID) | ORAL | Status: DC | PRN

## 2014-07-26 MED ORDER — ONDANSETRON HCL 4 MG/2ML IJ SOLN: 4.0000 mg | Freq: Once | INTRAMUSCULAR | Status: DC | PRN

## 2014-07-26 MED ORDER — SODIUM CHLORIDE 0.9 % IJ SOLN
INTRAMUSCULAR | Status: DC | PRN
  Administered 2014-07-26: 29 mL

## 2014-07-26 MED ORDER — LACTATED RINGERS IV SOLN
INTRAVENOUS | Status: DC
  Administered 2014-07-26: 1000 mL via INTRAVENOUS
  Administered 2014-07-26: 12:00:00 via INTRAVENOUS

## 2014-07-26 MED ORDER — PROPOFOL 10 MG/ML IV BOLUS
INTRAVENOUS | Status: AC
  Filled 2014-07-26: qty 20

## 2014-07-26 MED ORDER — MAGNESIUM CITRATE PO SOLN: 1.0000 | Freq: Once | ORAL | Status: AC | PRN

## 2014-07-26 MED ORDER — FERROUS SULFATE 325 (65 FE) MG PO TABS
325.0000 mg | ORAL_TABLET | Freq: Three times a day (TID) | ORAL | Status: DC
  Administered 2014-07-27 – 2014-07-28 (×3): 325 mg via ORAL
  Filled 2014-07-26 (×8): qty 1

## 2014-07-26 MED ORDER — KETOROLAC TROMETHAMINE 30 MG/ML IJ SOLN
INTRAMUSCULAR | Status: DC | PRN
  Administered 2014-07-26: 30 mg

## 2014-07-26 MED ORDER — MENTHOL 3 MG MT LOZG: 1.0000 | LOZENGE | OROMUCOSAL | Status: DC | PRN

## 2014-07-26 MED ORDER — METOCLOPRAMIDE HCL 5 MG/ML IJ SOLN: 5.0000 mg | Freq: Three times a day (TID) | INTRAMUSCULAR | Status: DC | PRN

## 2014-07-26 MED ORDER — CELECOXIB 200 MG PO CAPS
200.0000 mg | ORAL_CAPSULE | Freq: Two times a day (BID) | ORAL | Status: DC
  Administered 2014-07-26 – 2014-07-28 (×4): 200 mg via ORAL
  Filled 2014-07-26 (×5): qty 1

## 2014-07-26 MED ORDER — HYDROMORPHONE HCL 1 MG/ML IJ SOLN
0.2500 mg | INTRAMUSCULAR | Status: DC | PRN
  Administered 2014-07-26 (×2): 0.25 mg via INTRAVENOUS
  Administered 2014-07-26: 0.5 mg via INTRAVENOUS

## 2014-07-26 MED ORDER — PROPOFOL 10 MG/ML IV BOLUS
INTRAVENOUS | Status: AC
Start: 2014-07-26 — End: 2014-07-26
  Filled 2014-07-26: qty 20

## 2014-07-26 MED ORDER — KETOROLAC TROMETHAMINE 30 MG/ML IJ SOLN
INTRAMUSCULAR | Status: AC
  Filled 2014-07-26: qty 1

## 2014-07-26 MED ORDER — SODIUM CHLORIDE 0.9 % IR SOLN
Status: DC | PRN
  Administered 2014-07-26: 1000 mL

## 2014-07-26 MED ORDER — TRANEXAMIC ACID 100 MG/ML IV SOLN
1000.0000 mg | Freq: Once | INTRAVENOUS | Status: AC
  Administered 2014-07-26: 1000 mg via INTRAVENOUS
  Filled 2014-07-26: qty 10

## 2014-07-26 MED ORDER — ONDANSETRON HCL 4 MG/2ML IJ SOLN
INTRAMUSCULAR | Status: AC
  Filled 2014-07-26: qty 2

## 2014-07-26 MED ORDER — DEXAMETHASONE SODIUM PHOSPHATE 10 MG/ML IJ SOLN
10.0000 mg | Freq: Once | INTRAMUSCULAR | Status: AC
  Administered 2014-07-26: 10 mg via INTRAVENOUS

## 2014-07-26 MED ORDER — FLUTICASONE PROPIONATE 50 MCG/ACT NA SUSP: 2.0000 | Freq: Every day | NASAL | Status: DC | PRN

## 2014-07-26 MED ORDER — POLYETHYL GLYCOL-PROPYL GLYCOL 0.4-0.3 % OP GEL: 1.0000 [drp] | Freq: Every day | OPHTHALMIC | Status: DC

## 2014-07-26 MED ORDER — POLYVINYL ALCOHOL 1.4 % OP SOLN: 1.0000 [drp] | OPHTHALMIC | Status: DC | PRN

## 2014-07-26 MED ORDER — METHOCARBAMOL 500 MG PO TABS
500.0000 mg | ORAL_TABLET | Freq: Four times a day (QID) | ORAL | Status: DC | PRN
  Administered 2014-07-26 – 2014-07-28 (×3): 500 mg via ORAL
  Filled 2014-07-26 (×3): qty 1

## 2014-07-26 MED ORDER — KETAMINE HCL 10 MG/ML IJ SOLN
INTRAMUSCULAR | Status: AC
  Filled 2014-07-26: qty 1

## 2014-07-26 MED ORDER — PROPOFOL INFUSION 10 MG/ML OPTIME
INTRAVENOUS | Status: DC | PRN
  Administered 2014-07-26: 140 ug/kg/min via INTRAVENOUS

## 2014-07-26 MED ORDER — ONDANSETRON HCL 4 MG/2ML IJ SOLN
INTRAMUSCULAR | Status: DC | PRN
  Administered 2014-07-26: 4 mg via INTRAVENOUS

## 2014-07-26 MED ORDER — BISACODYL 10 MG RE SUPP: 10.0000 mg | Freq: Every day | RECTAL | Status: DC | PRN

## 2014-07-26 MED ORDER — ONDANSETRON HCL 4 MG PO TABS: 4.0000 mg | ORAL_TABLET | Freq: Four times a day (QID) | ORAL | Status: DC | PRN

## 2014-07-26 MED ORDER — FENTANYL CITRATE 0.05 MG/ML IJ SOLN
INTRAMUSCULAR | Status: AC
  Filled 2014-07-26: qty 2

## 2014-07-26 MED ORDER — MIDAZOLAM HCL 2 MG/2ML IJ SOLN
INTRAMUSCULAR | Status: AC
  Filled 2014-07-26: qty 2

## 2014-07-26 MED ORDER — SODIUM CHLORIDE 0.9 % IV SOLN
INTRAVENOUS | Status: DC
  Administered 2014-07-26 – 2014-07-27 (×2): via INTRAVENOUS
  Filled 2014-07-26 (×4): qty 1000

## 2014-07-26 MED ORDER — ASPIRIN EC 325 MG PO TBEC
325.0000 mg | DELAYED_RELEASE_TABLET | Freq: Two times a day (BID) | ORAL | Status: DC
  Administered 2014-07-27 – 2014-07-28 (×3): 325 mg via ORAL
  Filled 2014-07-26 (×5): qty 1

## 2014-07-26 MED ORDER — ALUM & MAG HYDROXIDE-SIMETH 200-200-20 MG/5ML PO SUSP
30.0000 mL | ORAL | Status: DC | PRN
  Administered 2014-07-27 – 2014-07-28 (×3): 30 mL via ORAL
  Filled 2014-07-26 (×3): qty 30

## 2014-07-26 MED ORDER — CETAPHIL MOISTURIZING EX CREA: 1.0000 "application " | TOPICAL_CREAM | Freq: Every day | CUTANEOUS | Status: DC

## 2014-07-26 MED ORDER — CEFAZOLIN SODIUM-DEXTROSE 2-3 GM-% IV SOLR
2.0000 g | Freq: Four times a day (QID) | INTRAVENOUS | Status: AC
  Administered 2014-07-26 (×2): 2 g via INTRAVENOUS
  Filled 2014-07-26 (×2): qty 50

## 2014-07-26 MED ORDER — DIPHENHYDRAMINE HCL 25 MG PO CAPS: 25.0000 mg | ORAL_CAPSULE | Freq: Four times a day (QID) | ORAL | Status: DC | PRN

## 2014-07-26 MED ORDER — PHENOL 1.4 % MT LIQD
1.0000 | OROMUCOSAL | Status: DC | PRN
Start: 2014-07-26 — End: 2014-07-28

## 2014-07-26 MED ORDER — DOXAZOSIN MESYLATE 4 MG PO TABS
4.0000 mg | ORAL_TABLET | Freq: Every day | ORAL | Status: DC
  Administered 2014-07-26 – 2014-07-28 (×3): 4 mg via ORAL
  Filled 2014-07-26 (×3): qty 1

## 2014-07-26 MED ORDER — HYDROMORPHONE HCL 1 MG/ML IJ SOLN
INTRAMUSCULAR | Status: AC
  Filled 2014-07-26: qty 1

## 2014-07-26 MED ORDER — POLYETHYLENE GLYCOL 3350 17 G PO PACK
17.0000 g | PACK | Freq: Two times a day (BID) | ORAL | Status: DC
  Administered 2014-07-26 – 2014-07-28 (×4): 17 g via ORAL

## 2014-07-26 MED ORDER — HYDROCODONE-ACETAMINOPHEN 7.5-325 MG PO TABS
1.0000 | ORAL_TABLET | ORAL | Status: DC
  Administered 2014-07-26 (×2): 1 via ORAL
  Administered 2014-07-26 – 2014-07-27 (×6): 2 via ORAL
  Administered 2014-07-28: 1 via ORAL
  Administered 2014-07-28 (×2): 2 via ORAL
  Filled 2014-07-26 (×3): qty 2
  Filled 2014-07-26: qty 1
  Filled 2014-07-26: qty 2
  Filled 2014-07-26: qty 1
  Filled 2014-07-26 (×4): qty 2
  Filled 2014-07-26 (×2): qty 1

## 2014-07-26 MED ORDER — DEXAMETHASONE SODIUM PHOSPHATE 10 MG/ML IJ SOLN
10.0000 mg | Freq: Once | INTRAMUSCULAR | Status: AC
  Administered 2014-07-27: 10 mg via INTRAVENOUS
  Filled 2014-07-26: qty 1

## 2014-07-26 MED ORDER — DOCUSATE SODIUM 100 MG PO CAPS
100.0000 mg | ORAL_CAPSULE | Freq: Two times a day (BID) | ORAL | Status: DC
  Administered 2014-07-26 – 2014-07-28 (×4): 100 mg via ORAL

## 2014-07-26 MED ORDER — BUPIVACAINE IN DEXTROSE 0.75-8.25 % IT SOLN
INTRATHECAL | Status: DC | PRN
  Administered 2014-07-26: 2 mL via INTRATHECAL

## 2014-07-26 MED ORDER — PROPOFOL 10 MG/ML IV BOLUS
INTRAVENOUS | Status: DC | PRN
  Administered 2014-07-26: 10 mg via INTRAVENOUS
  Administered 2014-07-26: 20 mg via INTRAVENOUS

## 2014-07-26 MED ORDER — FENTANYL CITRATE 0.05 MG/ML IJ SOLN
INTRAMUSCULAR | Status: DC | PRN
  Administered 2014-07-26 (×2): 50 ug via INTRAVENOUS

## 2014-07-26 MED ORDER — CHLORHEXIDINE GLUCONATE 4 % EX LIQD: 60.0000 mL | Freq: Once | CUTANEOUS | Status: DC

## 2014-07-26 MED ORDER — METHOCARBAMOL 1000 MG/10ML IJ SOLN
500.0000 mg | Freq: Four times a day (QID) | INTRAVENOUS | Status: DC | PRN
  Administered 2014-07-26: 500 mg via INTRAVENOUS
  Filled 2014-07-26 (×2): qty 5

## 2014-07-26 MED ORDER — MIDAZOLAM HCL 5 MG/5ML IJ SOLN
INTRAMUSCULAR | Status: DC | PRN
  Administered 2014-07-26 (×2): 1 mg via INTRAVENOUS

## 2014-07-26 MED ORDER — KETAMINE HCL 10 MG/ML IJ SOLN
INTRAMUSCULAR | Status: DC | PRN
  Administered 2014-07-26: 50 mg via INTRAVENOUS
  Administered 2014-07-26: 20 mg via INTRAVENOUS

## 2014-07-26 SURGICAL SUPPLY — 59 items
AUG TIB SZ4 5 REV STP WDG STRL (Knees) ×2 IMPLANT
BAG SPEC THK2 15X12 ZIP CLS (MISCELLANEOUS) ×1
BAG ZIPLOCK 12X15 (MISCELLANEOUS) ×1 IMPLANT
BANDAGE ELASTIC 6 VELCRO ST LF (GAUZE/BANDAGES/DRESSINGS) ×2 IMPLANT
BANDAGE ESMARK 6X9 LF (GAUZE/BANDAGES/DRESSINGS) ×1 IMPLANT
BLADE SAW SGTL 13.0X1.19X90.0M (BLADE) ×2 IMPLANT
BNDG CMPR 9X6 STRL LF SNTH (GAUZE/BANDAGES/DRESSINGS) ×1
BNDG ESMARK 6X9 LF (GAUZE/BANDAGES/DRESSINGS) ×2
BOWL SMART MIX CTS (DISPOSABLE) ×2 IMPLANT
CAP KNEE TOTAL 3 SIGMA ×1 IMPLANT
CEMENT HV SMART SET (Cement) ×2 IMPLANT
CUFF TOURN SGL QUICK 34 (TOURNIQUET CUFF) ×2
CUFF TRNQT CYL 34X4X40X1 (TOURNIQUET CUFF) ×1 IMPLANT
DECANTER SPIKE VIAL GLASS SM (MISCELLANEOUS) ×2 IMPLANT
DRAPE EXTREMITY T 121X128X90 (DRAPE) ×2 IMPLANT
DRAPE POUCH INSTRU U-SHP 10X18 (DRAPES) ×2 IMPLANT
DRAPE U-SHAPE 47X51 STRL (DRAPES) ×2 IMPLANT
DRSG AQUACEL AG ADV 3.5X10 (GAUZE/BANDAGES/DRESSINGS) ×2 IMPLANT
DURAPREP 26ML APPLICATOR (WOUND CARE) ×4 IMPLANT
ELECT REM PT RETURN 9FT ADLT (ELECTROSURGICAL) ×2
ELECTRODE REM PT RTRN 9FT ADLT (ELECTROSURGICAL) ×1 IMPLANT
FACESHIELD WRAPAROUND (MASK) ×10 IMPLANT
FACESHIELD WRAPAROUND OR TEAM (MASK) ×5 IMPLANT
GLOVE BIOGEL M 7.0 STRL (GLOVE) ×1 IMPLANT
GLOVE BIOGEL PI IND STRL 7.5 (GLOVE) ×1 IMPLANT
GLOVE BIOGEL PI IND STRL 8.5 (GLOVE) ×1 IMPLANT
GLOVE BIOGEL PI INDICATOR 7.5 (GLOVE) ×1
GLOVE BIOGEL PI INDICATOR 8.5 (GLOVE) ×1
GLOVE ECLIPSE 8.0 STRL XLNG CF (GLOVE) ×1 IMPLANT
GLOVE ORTHO TXT STRL SZ7.5 (GLOVE) ×4 IMPLANT
GOWN SPEC L3 XXLG W/TWL (GOWN DISPOSABLE) ×2 IMPLANT
GOWN STRL REUS W/TWL LRG LVL3 (GOWN DISPOSABLE) ×2 IMPLANT
HANDPIECE INTERPULSE COAX TIP (DISPOSABLE) ×2
KIT BASIN OR (CUSTOM PROCEDURE TRAY) ×2 IMPLANT
LIQUID BAND (GAUZE/BANDAGES/DRESSINGS) ×2 IMPLANT
MANIFOLD NEPTUNE II (INSTRUMENTS) ×2 IMPLANT
NDL SAFETY ECLIPSE 18X1.5 (NEEDLE) ×1 IMPLANT
NEEDLE HYPO 18GX1.5 SHARP (NEEDLE) ×2
PACK TOTAL JOINT (CUSTOM PROCEDURE TRAY) ×2 IMPLANT
PEN SKIN MARKING BROAD (MISCELLANEOUS) ×2 IMPLANT
POSITIONER SURGICAL ARM (MISCELLANEOUS) ×2 IMPLANT
SET HNDPC FAN SPRY TIP SCT (DISPOSABLE) ×1 IMPLANT
SET PAD KNEE POSITIONER (MISCELLANEOUS) ×2 IMPLANT
SUCTION FRAZIER 12FR DISP (SUCTIONS) ×2 IMPLANT
SUT MNCRL AB 4-0 PS2 18 (SUTURE) ×2 IMPLANT
SUT VIC AB 1 CT1 36 (SUTURE) ×2 IMPLANT
SUT VIC AB 2-0 CT1 27 (SUTURE) ×6
SUT VIC AB 2-0 CT1 TAPERPNT 27 (SUTURE) ×3 IMPLANT
SUT VLOC 180 0 24IN GS25 (SUTURE) ×2 IMPLANT
SYR 50ML LL SCALE MARK (SYRINGE) ×2 IMPLANT
TOWEL OR 17X26 10 PK STRL BLUE (TOWEL DISPOSABLE) ×2 IMPLANT
TOWEL OR NON WOVEN STRL DISP B (DISPOSABLE) IMPLANT
TRAY FOLEY CATH 14FRSI W/METER (CATHETERS) ×1 IMPLANT
TRAY FOLEY CATH 16FRSI W/METER (SET/KITS/TRAYS/PACK) ×1 IMPLANT
TRAY REVISION SZ 4 (Knees) ×1 IMPLANT
WATER STERILE IRR 1500ML POUR (IV SOLUTION) ×2 IMPLANT
WEDGE SIZE 4 5MM (Knees) ×2 IMPLANT
WRAP KNEE MAXI GEL POST OP (GAUZE/BANDAGES/DRESSINGS) ×2 IMPLANT
YANKAUER SUCT BULB TIP 10FT TU (MISCELLANEOUS) ×2 IMPLANT

## 2014-07-26 NOTE — Evaluation (Signed)
Physical Therapy Evaluation Patient Details Name: Nathan Reilly MRN: [id number] DOB: [birth year] Today's Date: 07/26/2014   History of Present Illness  LTKA  Clinical Impression  Patient ambulated well. Patient wants to see how progress and pain control goes. Has considered Rehab . Patient will benefit from PT to address problems listed in note below.    Follow Up Recommendations SNF;Supervision/Assistance - 24 hour    Equipment Recommendations  None recommended by PT    Recommendations for Other Services       Precautions / Restrictions Precautions Precautions: Fall;Knee      Mobility  Bed Mobility Overal bed mobility: Needs Assistance Bed Mobility: Supine to Sit     Supine to sit: Min assist     General bed mobility comments: L leg support  Transfers Overall transfer level: Needs assistance Equipment used: Rolling walker (2 wheeled) Transfers: Sit to/from Stand Sit to Stand: Min assist         General transfer comment: cues for hand and L leg position  Ambulation/Gait Ambulation/Gait assistance: Min assist Ambulation Distance (Feet): 125 Feet Assistive device: Rolling walker (2 wheeled) Gait Pattern/deviations: Step-to pattern;Step-through pattern        Stairs            Wheelchair Mobility    Modified Rankin (Stroke Patients Only)       Balance                                             Pertinent Vitals/Pain Pain Assessment: 0-10 Pain Score: 3  Pain Location: L knee Pain Descriptors / Indicators: Discomfort    Home Living Family/patient expects to be discharged to:: Private residence Living Arrangements: Spouse/significant other Available Help at Discharge: Family Type of Home: House Home Access: Stairs to enter   Technical brewer of Steps: 1 Home Layout: Two level;Bed/bath upstairs Home Equipment: Walker - 2 wheels;Cane - single point;Crutches      Prior Function Level of Independence: Independent                Hand Dominance        Extremity/Trunk Assessment               Lower Extremity Assessment: LLE deficits/detail   LLE Deficits / Details: able to perform SLR     Communication   Communication: No difficulties  Cognition Arousal/Alertness: Awake/alert Behavior During Therapy: WFL for tasks assessed/performed Overall Cognitive Status: Within Functional Limits for tasks assessed                      General Comments      Exercises        Assessment/Plan    PT Assessment Patient needs continued PT services  PT Diagnosis Difficulty walking   PT Problem List Decreased strength;Decreased range of motion;Decreased activity tolerance;Decreased mobility;Decreased knowledge of use of DME;Decreased safety awareness;Decreased knowledge of precautions  PT Treatment Interventions DME instruction;Gait training;Stair training;Functional mobility training;Therapeutic activities;Therapeutic exercise;Patient/family education   PT Goals (Current goals can be found in the Care Plan section) Acute Rehab PT Goals Patient Stated Goal: to  go home if I can PT Goal Formulation: With patient/family Time For Goal Achievement: 08/02/14 Potential to Achieve Goals: Good    Frequency 7X/week   Barriers to discharge        Co-evaluation  End of Session   Activity Tolerance: Patient tolerated treatment well Patient left: in chair;with call bell/phone within reach;with family/visitor present Nurse Communication: Mobility status         Time: 1723-1800 PT Time Calculation (min) (ACUTE ONLY): 37 min   Charges:   PT Evaluation $Initial PT Evaluation Tier I: 1 Procedure PT Treatments $Gait Training: 8-22 mins   PT G Codes:        Claretha Cooper 07/26/2014, 6:58 PM

## 2014-07-26 NOTE — Anesthesia Postprocedure Evaluation (Signed)
  Anesthesia Post-op Note  Patient: Nathan Reilly  Procedure(s) Performed: Procedure(s): LEFT TOTAL KNEE ARTHROPLASTY (Left)  Patient Location: PACU  Anesthesia Type:Spinal  Level of Consciousness: awake, alert , oriented and sedated  Airway and Oxygen Therapy: Patient Spontanous Breathing  Post-op Pain: none  Post-op Assessment: Post-op Vital signs reviewed, Patient's Cardiovascular Status Stable, Respiratory Function Stable, Patent Airway, No signs of Nausea or vomiting and Pain level controlled  Post-op Vital Signs: stable  Last Vitals:  Filed Vitals:   07/26/14 1501  BP:   Pulse:   Temp:   Resp: 18    Complications: No apparent anesthesia complications

## 2014-07-26 NOTE — Transfer of Care (Signed)
Immediate Anesthesia Transfer of Care Note  Patient: Nathan Reilly  Procedure(s) Performed: Procedure(s): LEFT TOTAL KNEE ARTHROPLASTY (Left)  Patient Location: PACU  Anesthesia Type:Spinal  Level of Consciousness: awake, alert  and oriented  Airway & Oxygen Therapy: Patient Spontanous Breathing and Patient connected to face mask oxygen  Post-op Assessment: Report given to RN and Post -op Vital signs reviewed and stable  Post vital signs: Reviewed and stable  Last Vitals:  Filed Vitals:   07/26/14 0819  BP: 126/83  Pulse: 65  Temp: 36.8 C  Resp: 18    Complications: No apparent anesthesia complications

## 2014-07-26 NOTE — Interval H&P Note (Signed)
History and Physical Interval Note:  07/26/2014 9:59 AM  Nathan Reilly  has presented today for surgery, with the diagnosis of LEFT KNEE OA  The various methods of treatment have been discussed with the patient and family. After consideration of risks, benefits and other options for treatment, the patient has consented to  Procedure(s): LEFT TOTAL KNEE ARTHROPLASTY (Left) as a surgical intervention .  The patient's history has been reviewed, patient examined, no change in status, stable for surgery.  I have reviewed the patient's chart and labs.  Questions were answered to the patient's satisfaction.     Mauri Pole

## 2014-07-26 NOTE — Anesthesia Procedure Notes (Signed)
Spinal Patient location during procedure: OR Staffing Resident/CRNA: Lindsee Labarre L Performed by: resident/CRNA  Preanesthetic Checklist Completed: patient identified, site marked, surgical consent, pre-op evaluation, timeout performed, IV checked, risks and benefits discussed and monitors and equipment checked Spinal Block Patient position: sitting Prep: Betadine Patient monitoring: heart rate, blood pressure and continuous pulse ox Location: L4-5 Injection technique: single-shot Needle Needle type: Sprotte  Needle gauge: 22 G Needle length: 9 cm Assessment Sensory level: T6 Additional Notes Kit expiration date 12/2015  Lot #[id number] Clear CSF, negative heme and negative paresthesia Tolerated well and returned to supine position

## 2014-07-26 NOTE — Anesthesia Preprocedure Evaluation (Signed)
Anesthesia Evaluation  Patient identified by MRN, date of birth, ID band Patient awake    Reviewed: Allergy & Precautions, NPO status , Patient's Chart, lab work & pertinent test results  Airway        Dental   Pulmonary          Cardiovascular hypertension,     Neuro/Psych    GI/Hepatic GERD-  ,  Endo/Other    Renal/GU      Musculoskeletal  (+) Arthritis -,   Abdominal   Peds  Hematology   Anesthesia Other Findings   Reproductive/Obstetrics                             Anesthesia Physical Anesthesia Plan  ASA: II  Anesthesia Plan: Spinal and MAC   Post-op Pain Management:    Induction: Intravenous  Airway Management Planned: Simple Face Mask  Additional Equipment:   Intra-op Plan:   Post-operative Plan:   Informed Consent: I have reviewed the patients History and Physical, chart, labs and discussed the procedure including the risks, benefits and alternatives for the proposed anesthesia with the patient or authorized representative who has indicated his/her understanding and acceptance.     Plan Discussed with: CRNA, Anesthesiologist and Surgeon  Anesthesia Plan Comments:         Anesthesia Quick Evaluation

## 2014-07-26 NOTE — Progress Notes (Signed)
Utilization review completed.  

## 2014-07-26 NOTE — Op Note (Signed)
NAME:  Nathan Reilly                      MEDICAL RECORD NO.:  [id number]                             FACILITY:  [facility]      PHYSICIAN:  Pietro Cassis. Alvan Dame, M.D.  DATE OF BIRTH:  Nov 21, 1940      DATE OF PROCEDURE:  07/26/2014                                     OPERATIVE REPORT         PREOPERATIVE DIAGNOSIS:  Left knee osteoarthritis.      POSTOPERATIVE DIAGNOSIS:  Left knee osteoarthritis.      FINDINGS:  The patient was noted to have complete loss of cartilage and   bone-on-bone arthritis with associated osteophytes in the medial and patellofemoral compartments of   the knee with a significant synovitis and associated effusion.      PROCEDURE:  Left total knee replacement.      COMPONENTS USED:  [employer] rotating platform posterior stabilized knee   system, a size 4 femur, 4 MBT tibia with 54mm medial and lateral augments, a 12.5 mm PS insert, and 41 patellar   button.      SURGEON:  Pietro Cassis. Alvan Dame, M.D.      ASSISTANT:  Nehemiah Massed, PA-C.      ANESTHESIA:  Spinal.      SPECIMENS:  None.      COMPLICATION:  None.      DRAINS:  None.  EBL: <100cc     TOURNIQUET TIME:   Total Tourniquet Time Documented: Thigh (Left) - 36 minutes Total: Thigh (Left) - 36 minutes  .      The patient was stable to the recovery room.      INDICATION FOR PROCEDURE:  Nathan Reilly is a 74 y.o. male patient of   mine.  The patient had been seen, evaluated, and treated conservatively in the   office with medication, activity modification, and injections.  The patient had   radiographic changes of bone-on-bone arthritis with endplate sclerosis and osteophytes noted.      The patient failed conservative measures including medication, injections, and activity modification, and at this point was ready for more definitive measures.   Based on the radiographic changes and failed conservative measures, the patient   decided to proceed with total knee replacement.  Risks of infection,   DVT,  component failure, need for revision surgery, postop course, and   expectations were all   discussed and reviewed.  Consent was obtained for benefit of pain   relief.      PROCEDURE IN DETAIL:  The patient was brought to the operative theater.   Once adequate anesthesia, preoperative antibiotics, 2 gm of Ancef, 1gm Tranexamic Acid, 10mg  of Decadron administered, the patient was positioned supine with the left thigh tourniquet placed.  The  left lower extremity was prepped and draped in sterile fashion.  A time-   out was performed identifying the patient, planned procedure, and   extremity.      The left lower extremity was placed in the [facility] leg holder.  The leg was   exsanguinated, tourniquet elevated to 250 mmHg.  A midline incision was   made  followed by median parapatellar arthrotomy.  Following initial   exposure, attention was first directed to the patella.  Precut   measurement was noted to be 23 mm.  I resected down to 14 mm and used a   41 patellar button to restore patellar height as well as cover the cut   surface.      The lug holes were drilled and a metal shim was placed to protect the   patella from retractors and saw blades.      At this point, attention was now directed to the femur.  The femoral   canal was opened with a drill, irrigated to try to prevent fat emboli.  An   intramedullary rod was passed at 5 degrees valgus, 10 mm of bone was   resected off the distal femur.  Following this resection, the tibia was   subluxated anteriorly.  Using the extramedullary guide, 10 mm of bone was resected off   the proximal lateral tibia.  We confirmed the gap would be   stable medially and laterally with a 10 mm insert as well as confirmed   the cut was perpendicular in the coronal plane, checking with an alignment rod.      Once this was done, I sized the femur to be a size 4 in the anterior-   posterior dimension, chose a standard component based on medial and    lateral dimension.  The size 4 rotation block was then pinned in   position anterior referenced using the C-clamp to set rotation.  The   anterior, posterior, and  chamfer cuts were made without difficulty nor   notching making certain that I was along the anterior cortex to help   with flexion gap stability.      The final box cut was made off the lateral aspect of distal femur.      At this point, the tibia was sized to be a size 4, the size 4 tray was   then pinned in position through the medial third of the tubercle,   drilled, and keel punched.  Trial reduction was now carried with a 4 femur,  4 tibia, a 12.5 mm insert, and the 41 patella botton.  During this trial reduction I was concerned that apparently though only taking 23mm off the lateral proximal tibia that I was going to need a 17.5 or 51mm insert.  Given these findings and my general concern of knee replacements with too much poly I decided to prep the tibia for the MBT tray with 89mm augments.  Once this was done we opened the final components including 74mm medial and lateral augments. Final components were   opened and cement was mixed.  The knee was irrigated with normal saline   solution and pulse lavage.  The synovial lining was   then injected with 30cc of 0.25% Marcaine with epinephrine and 1 cc of Toradol plus 30cc of NS for a   total of 61 cc.      The knee was irrigated.  Final implants were then cemented onto clean and   dried cut surfaces of bone with the knee brought to full extension with a 12.5   mm trial insert without hyper-extension.      Once the cement had fully cured, the excess cement was removed   throughout the knee.  I confirmed I was satisfied with the range of   motion and stability, and the final 12.5 mm PS insert was chosen.  It was   placed into the knee.      The tourniquet had been let down at 36 minutes.  No significant   hemostasis required.  The   extensor mechanism was then reapproximated  using #1 Vicryl and # 0 V-lock sutures with the knee   in flexion.  The   remaining wound was closed with 2-0 Vicryl and running 4-0 Monocryl.   The knee was cleaned, dried, dressed sterilely using Dermabond and   Aquacel dressing.  The patient was then   brought to recovery room in stable condition, tolerating the procedure   well.   Please note that Physician Assistant, Nehemiah Massed, was present for the entirety of the case, and was utilized for pre-operative positioning, peri-operative retractor management, general facilitation of the procedure.  He was also utilized for primary wound closure at the end of the case.              Pietro Cassis Alvan Dame, M.D.    07/26/2014 12:29 PM

## 2014-07-27 ENCOUNTER — Encounter (HOSPITAL_COMMUNITY): Payer: Self-pay | Admitting: Orthopedic Surgery

## 2014-07-27 DIAGNOSIS — E663 Overweight: Secondary | ICD-10-CM | POA: Diagnosis present

## 2014-07-27 LAB — BASIC METABOLIC PANEL
Anion gap: 7 (ref 5–15)
BUN: 18 mg/dL (ref 6–23)
CALCIUM: 8.5 mg/dL (ref 8.4–10.5)
CO2: 24 mmol/L (ref 19–32)
CREATININE: 1.05 mg/dL (ref 0.50–1.35)
Chloride: 105 mmol/L (ref 96–112)
GFR calc Af Amer: 79 mL/min — ABNORMAL LOW (ref >90)
GFR, EST NON AFRICAN AMERICAN: 68 mL/min — AB (ref >90)
Glucose, Bld: 119 mg/dL — ABNORMAL HIGH (ref 70–99)
Potassium: 4.9 mmol/L (ref 3.5–5.1)
Sodium: 136 mmol/L (ref 135–145)

## 2014-07-27 LAB — CBC
HCT: 37.1 % — ABNORMAL LOW (ref 39.0–52.0)
Hemoglobin: 12.5 g/dL — ABNORMAL LOW (ref 13.0–17.0)
MCH: 30.9 pg (ref 26.0–34.0)
MCHC: 33.7 g/dL (ref 30.0–36.0)
MCV: 91.6 fL (ref 78.0–100.0)
PLATELETS: 163 10*3/uL (ref 150–400)
RBC: 4.05 MIL/uL — ABNORMAL LOW (ref 4.22–5.81)
RDW: 12.9 % (ref 11.5–15.5)
WBC: 15.2 10*3/uL — ABNORMAL HIGH (ref 4.0–10.5)

## 2014-07-27 NOTE — Progress Notes (Signed)
Physical Therapy Treatment Patient Details Name: Nathan Reilly MRN: [id number] DOB: 16-Feb-1941 Today's Date: 07/27/2014    History of Present Illness LTKA    PT Comments    Patient tolerating ROM to about 90 degrees in sitting. Plans to DC to home now.   Follow Up Recommendations  Home health PT;Supervision/Assistance - 24 hour     Equipment Recommendations  None recommended by PT    Recommendations for Other Services       Precautions / Restrictions Precautions Precautions: Fall;Knee Restrictions Weight Bearing Restrictions: No    Mobility  Bed Mobility Overal bed mobility: Needs Assistance Bed Mobility: Supine to Sit     Supine to sit: Min guard        Transfers Overall transfer level: Needs assistance Equipment used: Rolling walker (2 wheeled) Transfers: Sit to/from Stand Sit to Stand: Min guard         General transfer comment: cues for hand and L leg position  Ambulation/Gait Ambulation/Gait assistance: Min guard Ambulation Distance (Feet): 250 Feet Assistive device: Rolling walker (2 wheeled) Gait Pattern/deviations: Step-through pattern     General Gait Details: cues for sequence, knee flexion, heel strike.   Stairs            Wheelchair Mobility    Modified Rankin (Stroke Patients Only)       Balance                                    Cognition Arousal/Alertness: Awake/alert Behavior During Therapy: WFL for tasks assessed/performed Overall Cognitive Status: Within Functional Limits for tasks assessed                      Exercises Total Joint Exercises Ankle Circles/Pumps: AROM;Both;10 reps;Supine Quad Sets: AROM;Both;10 reps;Supine Heel Slides: AROM;Left;Supine Straight Leg Raises: AROM;Left;10 reps;Supine Long Arc Quad: AROM;Left;Seated Knee Flexion: AROM;Left;Seated    General Comments General comments (skin integrity, edema, etc.): min assist to manage gown      Pertinent Vitals/Pain Pain  Assessment: 0-10 Pain Score: 2  Pain Location: L knee Pain Descriptors / Indicators: Tightness Pain Intervention(s): Repositioned;Ice applied    Home Living Family/patient expects to be discharged to:: Private residence Living Arrangements: Spouse/significant other Available Help at Discharge: Family Type of Home: House Home Access: Stairs to enter   Home Layout: Two level;Bed/bath upstairs Home Equipment: Environmental consultant - 2 wheels;Cane - single point;Crutches;Bedside commode      Prior Function Level of Independence: Independent          PT Goals (current goals can now be found in the care plan section) Acute Rehab PT Goals Patient Stated Goal: to  go home if I can Progress towards PT goals: Progressing toward goals    Frequency  7X/week    PT Plan Discharge plan needs to be updated    Co-evaluation             End of Session   Activity Tolerance: Patient tolerated treatment well Patient left: in chair;with call bell/phone within reach;with family/visitor present     Time: 5638-7564 PT Time Calculation (min) (ACUTE ONLY): 43 min  Charges:  $Gait Training: 8-22 mins $Therapeutic Exercise: 8-22 mins $Self Care/Home Management: 02-25-2023                    G Codes:      Claretha Cooper 07/27/2014, 3:22 PM

## 2014-07-27 NOTE — Evaluation (Signed)
Occupational Therapy Evaluation Patient Details Name: Nathan Reilly MRN: [id number] DOB: [birth year] Today's Date: 07/27/2014    History of Present Illness LTKA   Clinical Impression   Pt practiced up to 3in1. Min cues for safety and to steady with toilet transfer. Will follow to progress ADL independence. Son present for session.    Follow Up Recommendations  No OT follow up;Supervision/Assistance - 24 hour    Equipment Recommendations  None recommended by OT    Recommendations for Other Services       Precautions / Restrictions Precautions Precautions: Fall;Knee Restrictions Weight Bearing Restrictions: No      Mobility Bed Mobility Overal bed mobility: Needs Assistance Bed Mobility: Supine to Sit     Supine to sit: Min guard        Transfers Overall transfer level: Needs assistance Equipment used: Rolling walker (2 wheeled) Transfers: Sit to/from Stand Sit to Stand: Min guard         General transfer comment: cues for hand and L leg position    Balance                                            ADL Overall ADL's : Needs assistance/impaired Eating/Feeding: Independent;Sitting   Grooming: Wash/dry hands;Set up;Sitting   Upper Body Bathing: Set up;Sitting   Lower Body Bathing: Minimal assistance;Sit to/from stand   Upper Body Dressing : Set up;Sitting   Lower Body Dressing: Minimal assistance;Sit to/from stand Lower Body Dressing Details (indicate cue type and reason): min assist to start sock over L foot Toilet Transfer: Minimal assistance;Ambulation;BSC;RW   Toileting- Clothing Manipulation and Hygiene: Minimal assistance;Sit to/from stand         General ADL Comments: Educated on safety with 3in1 transfers and proper hand placement and LE management. Pt tends to step too close to walker at times so provided cues to push walker slightly further in front before stepping. Pt able to unhook sock from heel but unable to fully  doff L sock. Practiced donning sock over heel but did need assist to start it over toes. Discussed use of 3in1 as showe chair. Wife states she can assist with LB self care.      Vision     Perception     Praxis      Pertinent Vitals/Pain Pain Assessment: 0-10 Pain Score: 3  Pain Location: L knee Pain Descriptors / Indicators: Aching;Tightness Pain Intervention(s): Repositioned;Ice applied     Hand Dominance     Extremity/Trunk Assessment Upper Extremity Assessment Upper Extremity Assessment: Overall WFL for tasks assessed           Communication Communication Communication: No difficulties   Cognition Arousal/Alertness: Awake/alert Behavior During Therapy: WFL for tasks assessed/performed Overall Cognitive Status: Within Functional Limits for tasks assessed                     General Comments       Exercises       Shoulder Instructions      Home Living Family/patient expects to be discharged to:: Private residence Living Arrangements: Spouse/significant other Available Help at Discharge: Family Type of Home: House Home Access: Stairs to enter Technical brewer of Steps: 1   Home Layout: Two level;Bed/bath upstairs Alternate Level Stairs-Number of Steps: 14 Alternate Level Stairs-Rails: Right;Left Bathroom Shower/Tub: Occupational psychologist: Standard  Home Equipment: [place] - 2 wheels;Cane - single point;Crutches;Bedside commode          Prior Functioning/Environment Level of Independence: Independent             OT Diagnosis: Generalized weakness   OT Problem List: Decreased strength;Decreased knowledge of use of DME or AE   OT Treatment/Interventions: Self-care/ADL training;Patient/family education;Therapeutic activities;DME and/or AE instruction    OT Goals(Current goals can be found in the care plan section) Acute Rehab OT Goals Patient Stated Goal: to  go home if I can OT Goal Formulation: With  patient Time For Goal Achievement: 08/03/14 Potential to Achieve Goals: Good  OT Frequency: Min 2X/week   Barriers to D/C:            Co-evaluation              End of Session Equipment Utilized During Treatment: Gait belt;Rolling walker  Activity Tolerance: Patient tolerated treatment well Patient left: in chair;with call bell/phone within reach   Time: 1130-1205 OT Time Calculation (min): 35 min Charges:  OT General Charges $OT Visit: 1 Procedure OT Evaluation $Initial OT Evaluation Tier I: 1 Procedure OT Treatments $Therapeutic Activity: 8-22 mins G-Codes:    Jules Schick  [phone] 07/27/2014, 12:13 PM

## 2014-07-27 NOTE — Progress Notes (Signed)
     Subjective: 1 Day Post-Op Procedure(s) (LRB): LEFT TOTAL KNEE ARTHROPLASTY (Left)   Patient reports pain as mild, pain controlled. No events throughout the night. Concerned regarding being able to do everything he has to at home. Discussion was had and they will see how well he does with PT, but would like to go home if he does well.  Objective:   VITALS:   Filed Vitals:   07/27/14  BP: 136/81  Pulse: 58  Temp: 97.8 F (36.6 C)   Resp: 14    Dorsiflexion/Plantar flexion intact Incision: dressing C/D/I No cellulitis present Compartment soft  LABS  Recent Labs  07/27/14 0455  HGB 12.5*  HCT 37.1*  WBC 15.2*  PLT 163     Recent Labs  07/27/14 0455  NA 136  K 4.9  BUN 18  CREATININE 1.05  GLUCOSE 119*     Assessment/Plan: 1 Day Post-Op Procedure(s) (LRB): LEFT TOTAL KNEE ARTHROPLASTY (Left) Foley cath d/c'ed Advance diet Up with therapy D/C IV fluids Discharge home with home health / SNF  Overweight (BMI 25-29.9) Estimated body mass index is 26.79 kg/(m^2) as calculated from the following:   Height as of this encounter: 5' 9.5" (1.765 m).   Weight as of this encounter: 83.462 kg (184 lb). Patient also counseled that weight may inhibit the healing process Patient counseled that losing weight will help with future health issues       [place] Pugh. Sajan Cheatwood   PAC  07/27/2014, 8:41 AM

## 2014-07-27 NOTE — Progress Notes (Signed)
CSW consulted for SNF placement. Met with pt / family. Pt hopes to d/c home tomorrow. CSW is available to assist with placement if plan changes. RNCM is aware and is assisting with d/c planning to home.  Werner Lean LCSW [phone]

## 2014-07-27 NOTE — Care Management Note (Addendum)
    Page 1 of 1   07/27/2014     3:31:45 PM CARE MANAGEMENT NOTE 07/27/2014  Patient:  Nathan Reilly, Nathan Reilly   Account Number:  [id number]  Date Initiated:  07/27/2014  Documentation initiated by:  [facility]  Subjective/Objective Assessment:   adm: LEFT TOTAL KNEE ARTHROPLASTY (Left)     Action/Plan:   discharge planning   Anticipated DC Date:  07/20/2014   Anticipated DC Plan:  Box  CM consult      [facility] Choice  HOME HEALTH   Choice offered to / List presented to:  C-1 Patient        [place] [facility]  [facility]      [place].   Status of service:  Completed, signed off Medicare Important Message given?   (If response is "NO", the following Medicare IM given date fields will be blank) Date Medicare IM given:   Medicare IM given by:   Date Additional Medicare IM given:   Additional Medicare IM given by:    Discharge Disposition:  [place]  Per UR Regulation:    If discussed at Long Length of Stay Meetings, dates discussed:    Comments:  07/27/14 11:30 CM met with pt in room to offer choice of home health agency.  Pt chooses [facility]  to render HHPT.  No DME is needed as pt has 3n1 and rolling walker at home. Address and contact information verified by pt.   Referral called to [facility]  rep, Kristen. No other CM needs were communicated. Mariane Masters, BSN, CM [phone].

## 2014-07-27 NOTE — Progress Notes (Signed)
Physical Therapy Treatment Patient Details Name: STEPAN VERRETTE MRN: [id number] DOB: 09/12/1940 Today's Date: 07/27/2014    History of Present Illness LTKA    PT Comments    Progressing very well. Will DC to home tomorrow. Marland Kitchen Has begun stair  instruction without any problems  Follow Up Recommendations  Home health PT;Supervision/Assistance - 24 hour     Equipment Recommendations  None recommended by PT    Recommendations for Other Services       Precautions / Restrictions Precautions Precautions: Fall;Knee Restrictions Weight Bearing Restrictions: No    Mobility  Bed Mobility Overal bed mobility: Needs Assistance Bed Mobility: Sit to Supine     Supine to sit: Min guard Sit to supine: Supervision      Transfers Overall transfer level: Needs assistance Equipment used: Rolling walker (2 wheeled) Transfers: Sit to/from Stand Sit to Stand: Min guard         General transfer comment: cues for hand and L leg position  Ambulation/Gait Ambulation/Gait assistance: Supervision Ambulation Distance (Feet): 150 Feet Assistive device: Rolling walker (2 wheeled) Gait Pattern/deviations: Step-through pattern     General Gait Details: cues for sequence, knee flexion, heel strike.   Stairs Stairs: Yes Stairs assistance: Min assist Stair Management: One rail Right;Step to pattern;Forwards;With crutches   General stair comments: practiced 2-3 x 5 times for consistency and safety with crutch.  Wheelchair Mobility    Modified Rankin (Stroke Patients Only)       Balance                                    Cognition Arousal/Alertness: Awake/alert Behavior During Therapy: WFL for tasks assessed/performed Overall Cognitive Status: Within Functional Limits for tasks assessed                      Exercises Total Joint Exercises Ankle Circles/Pumps: AROM;Both;10 reps;Supine Quad Sets: AROM;Both;10 reps;Supine Short Arc Quad: AROM;Left;10  reps;Supine Heel Slides: AROM;Left;Supine Hip ABduction/ADduction: AROM;10 reps;Supine Straight Leg Raises: AROM;Left;10 reps;Supine Long Arc Quad: AROM;Left;Seated Knee Flexion: AROM;Left;Seated Goniometric ROM: 5-90 L knee    General Comments General comments (skin integrity, edema, etc.): min assist to manage gown      Pertinent Vitals/Pain Pain Score: 2  Pain Location: L knee Pain Descriptors / Indicators: Tightness    Home Living Family/patient expects to be discharged to:: Private residence Living Arrangements: Spouse/significant other Available Help at Discharge: Family Type of Home: House Home Access: Stairs to enter   Home Layout: Two level;Bed/bath upstairs Home Equipment: Environmental consultant - 2 wheels;Cane - single point;Crutches;Bedside commode      Prior Function Level of Independence: Independent          PT Goals (current goals can now be found in the care plan section) Acute Rehab PT Goals Patient Stated Goal: to  go home if I can Progress towards PT goals: Progressing toward goals    Frequency  7X/week    PT Plan Current plan remains appropriate    Co-evaluation             End of Session   Activity Tolerance: Patient tolerated treatment well Patient left: with call bell/phone within reach;with family/visitor present;in bed     Time: 1535-1600 PT Time Calculation (min) (ACUTE ONLY): 25 min  Charges:  $Gait Training: 8-22 mins $Therapeutic Exercise: 8-22 mins $Self Care/Home Management: 8-22  G Codes:      Claretha Cooper 07/27/2014, 4:03 PM

## 2014-07-28 LAB — CBC
HCT: 36.3 % — ABNORMAL LOW (ref 39.0–52.0)
HEMOGLOBIN: 12.1 g/dL — AB (ref 13.0–17.0)
MCH: 30.6 pg (ref 26.0–34.0)
MCHC: 33.3 g/dL (ref 30.0–36.0)
MCV: 91.7 fL (ref 78.0–100.0)
Platelets: 148 10*3/uL — ABNORMAL LOW (ref 150–400)
RBC: 3.96 MIL/uL — ABNORMAL LOW (ref 4.22–5.81)
RDW: 12.9 % (ref 11.5–15.5)
WBC: 15.8 10*3/uL — ABNORMAL HIGH (ref 4.0–10.5)

## 2014-07-28 LAB — BASIC METABOLIC PANEL
Anion gap: 10 (ref 5–15)
BUN: 21 mg/dL (ref 6–23)
CHLORIDE: 101 mmol/L (ref 96–112)
CO2: 26 mmol/L (ref 19–32)
CREATININE: 1.08 mg/dL (ref 0.50–1.35)
Calcium: 8.7 mg/dL (ref 8.4–10.5)
GFR calc Af Amer: 77 mL/min — ABNORMAL LOW (ref >90)
GFR calc non Af Amer: 66 mL/min — ABNORMAL LOW (ref >90)
Glucose, Bld: 112 mg/dL — ABNORMAL HIGH (ref 70–99)
POTASSIUM: 4.6 mmol/L (ref 3.5–5.1)
Sodium: 137 mmol/L (ref 135–145)

## 2014-07-28 MED ORDER — HYDROCODONE-ACETAMINOPHEN 7.5-325 MG PO TABS: 1.0000 | ORAL_TABLET | ORAL | Status: DC | PRN

## 2014-07-28 MED ORDER — TIZANIDINE HCL 4 MG PO TABS: 4.0000 mg | ORAL_TABLET | Freq: Four times a day (QID) | ORAL | Status: DC | PRN

## 2014-07-28 MED ORDER — DOCUSATE SODIUM 100 MG PO CAPS: 100.0000 mg | ORAL_CAPSULE | Freq: Two times a day (BID) | ORAL | Status: DC

## 2014-07-28 MED ORDER — POLYETHYLENE GLYCOL 3350 17 G PO PACK: 17.0000 g | PACK | Freq: Two times a day (BID) | ORAL | Status: DC

## 2014-07-28 MED ORDER — ASPIRIN 325 MG PO TBEC: 325.0000 mg | DELAYED_RELEASE_TABLET | Freq: Two times a day (BID) | ORAL | Status: AC

## 2014-07-28 MED ORDER — FERROUS SULFATE 325 (65 FE) MG PO TABS: 325.0000 mg | ORAL_TABLET | Freq: Three times a day (TID) | ORAL | Status: DC

## 2014-07-28 NOTE — Progress Notes (Signed)
Occupational Therapy Treatment Patient Details Name: Nathan Reilly MRN: [id number] DOB: 1941/02/09 Today's Date: 07/28/2014    History of present illness LTKA   OT comments  Pt doing well but some fatigue reported after ADL session. Encouraged rest breaks and breaking up tasks into small steps/tasks. Wife present for education. Pt supposed to d/c today.   Follow Up Recommendations  No OT follow up;Supervision/Assistance - 24 hour    Equipment Recommendations  None recommended by OT    Recommendations for Other Services      Precautions / Restrictions Precautions Precautions: Fall;Knee       Mobility Bed Mobility Overal bed mobility: Needs Assistance       Supine to sit: Supervision        Transfers Overall transfer level: Needs assistance Equipment used: Rolling walker (2 wheeled) Transfers: Sit to/from Stand Sit to Stand: Min guard         General transfer comment: cues for hand placement and LE management.     Balance                                   ADL                       Lower Body Dressing: Minimal assistance;Sit to/from stand Lower Body Dressing Details (indicate cue type and reason): min assist only to get pj bottoms over gripper socks that he already had one. pt able to thread pants over L LE himself and don sock on L foot. Toilet Transfer: Min guard;Ambulation;BSC;RW       Tub/ Shower Transfer: Minimal assistance;Rolling walker     General ADL Comments: Pt did bathing and dressing on 3in1 in bathroom and then practiced shower transfer. Pt up and down several times to pull up clothing, etc. and got a little fatigued and warm. Pt reporting that he felt a little sweaty and tired. BP taken once back in chair. 145/77. WIth rest and opening the door, pt started to feel better. Encouraged many rest breaks initially with activity to help with fatigue. Pt and wife verbalized understanding. Discussed use of luke warm water and not  hot water. Discussed arrangement of 3in1 in shower if walker will go through shower opening and if it will not. Reviewed sequence for LB dressing and safety with having walker in front of him.       Vision                     Perception     Praxis      Cognition   Behavior During Therapy: WFL for tasks assessed/performed Overall Cognitive Status: Within Functional Limits for tasks assessed                       Extremity/Trunk Assessment               Exercises     Shoulder Instructions       General Comments      Pertinent Vitals/ Pain       Pain Assessment: 0-10 Pain Score: 1  Pain Location: L knee Pain Descriptors / Indicators: Sore Pain Intervention(s): Repositioned;Ice applied  Home Living  Prior Functioning/Environment              Frequency Min 2X/week     Progress Toward Goals  OT Goals(current goals can now be found in the care plan section)  Progress towards OT goals: Progressing toward goals     Plan Discharge plan remains appropriate    Co-evaluation                 End of Session Equipment Utilized During Treatment: Rolling walker   Activity Tolerance Patient tolerated treatment well   Patient Left in chair;with call bell/phone within reach;with family/visitor present   Nurse Communication          Time: 0927-1020 OT Time Calculation (min): 53 min  Charges: OT General Charges $OT Visit: 1 Procedure OT Treatments $Self Care/Home Management : 23-37 mins $Therapeutic Activity: 23-37 mins  Jules Schick  [phone] 07/28/2014, 11:08 AM

## 2014-07-28 NOTE — Progress Notes (Signed)
     Subjective: 2 Days Post-Op Procedure(s) (LRB): LEFT TOTAL KNEE ARTHROPLASTY (Left)   Patient reports pain as mild, pain controlled. No events throughout the night. He has enjoyed his whole experience at the hospital.  Feels confident after PT that he will do well at home.  Ready to be discharged home.  Objective:   VITALS:   Filed Vitals:   07/28/14 0636  BP: 139/77  Pulse: 68  Temp: 98.2 F (36.8 C)  Resp: 16    Dorsiflexion/Plantar flexion intact Incision: dressing C/D/I No cellulitis present Compartment soft  LABS  Recent Labs  07/27/14 0455 07/28/14 0433  HGB 12.5* 12.1*  HCT 37.1* 36.3*  WBC 15.2* 15.8*  PLT 163 148*     Recent Labs  07/27/14 0455 07/28/14 0433  NA 136 137  K 4.9 4.6  BUN 18 21  CREATININE 1.05 1.08  GLUCOSE 119* 112*     Assessment/Plan: 2 Days Post-Op Procedure(s) (LRB): LEFT TOTAL KNEE ARTHROPLASTY (Left) Up with therapy Discharge home with home health  Follow up in 2 weeks at [facility]. Follow up with OLIN,Isaid Salvia D in 2 weeks.  Contact information:  [facility] [address] [place] [phone]    Overweight (BMI 25-29.9) Estimated body mass index is 26.79 kg/(m^2) as calculated from the following:  Height as of this encounter: 5' 9.5" (1.765 m).  Weight as of this encounter: 83.462 kg (184 lb). Patient also counseled that weight may inhibit the healing process Patient counseled that losing weight will help with future health issues       West Pugh. Kendric Sindelar   PAC  07/28/2014, 8:17 AM

## 2014-07-28 NOTE — Progress Notes (Signed)
Physical Therapy Treatment Patient Details Name: Nathan Reilly MRN: [id number] DOB: 01-Apr-1941 Today's Date: 07/28/2014    History of Present Illness LTKA    PT Comments    POD # 2 pt dressed and eager to D/C to home.  Spouse present and educated on all mentioned below.  Practiced negotiating one step forward with RW, practiced a flight of stairs with one rail/one crutch and instructed on HEP and use of ICE.    Follow Up Recommendations  Home health PT     Equipment Recommendations  None recommended by PT (did issue a set of crutches)    Recommendations for Other Services       Precautions / Restrictions Precautions Precautions: Fall;Knee    Mobility  Bed Mobility Overal bed mobility: Needs Assistance       Supine to sit: Supervision     General bed mobility comments: pt OOB in recliner  Transfers Overall transfer level: Needs assistance Equipment used: Rolling walker (2 wheeled) Transfers: Sit to/from Stand Sit to Stand: Modified independent (Device/Increase time)         General transfer comment: good safety cognition and use of hands  Ambulation/Gait Ambulation/Gait assistance: Modified independent (Device/Increase time);Supervision Ambulation Distance (Feet): 55 Feet Assistive device: Rolling walker (2 wheeled) Gait Pattern/deviations: Step-through pattern Gait velocity: WFL   General Gait Details: one VC safety with turns/completion and backward gait   Stairs   Stairs assistance: Min guard Stair Management: One rail Left;Step to pattern;Forwards;With crutches Number of Stairs: 12 General stair comments: with spouse practic3es a flight of stairs using one crutch and one rail.    Wheelchair Mobility    Modified Rankin (Stroke Patients Only)       Balance                                    Cognition Arousal/Alertness: Awake/alert Behavior During Therapy: WFL for tasks assessed/performed Overall Cognitive Status: Within  Functional Limits for tasks assessed                      Exercises      General Comments        Pertinent Vitals/Pain Pain Assessment: 0-10 Pain Score: 3  Pain Location: L knee Pain Descriptors / Indicators: Sore Pain Intervention(s): Monitored during session;Premedicated before session;Repositioned;Ice applied    Home Living                      Prior Function            PT Goals (current goals can now be found in the care plan section) Progress towards PT goals: Progressing toward goals    Frequency  7X/week    PT Plan      Co-evaluation             End of Session Equipment Utilized During Treatment: Gait belt Activity Tolerance: Patient tolerated treatment well Patient left: in chair;with call bell/phone within reach;with family/visitor present     Time: 6803-2122 PT Time Calculation (min) (ACUTE ONLY): 31 min  Charges:  $Gait Training: 8-22 mins $Therapeutic Activity: 8-22 mins                    G Codes:      [name]  PTA [facility]  Acute  Rehab Pager      [phone]

## 2014-07-28 NOTE — Discharge Summary (Signed)
Physician Discharge Summary  Patient ID: Nathan Reilly MRN: [id number] DOB/AGE: 14-Mar-1941 74 y.o.  Admit date: 07/26/2014 Discharge date:  07/28/2014  Procedures:  Procedure(s) (LRB): LEFT TOTAL KNEE ARTHROPLASTY (Left)  Attending Physician:  Dr. Paralee Reilly   Admission Diagnoses:   Left knee primary OA / pain  Discharge Diagnoses:  Principal Problem:   S/P left TKA Active Problems:   S/P knee replacement   Overweight (BMI 25.0-29.9)  Past Medical History  Diagnosis Date  . Hay fever   . Scarlet fever     IN CHILDHOOD  . Temporomandibular joint disorders, unspecified   . Hypertension   . Arthritis   . History of skin cancer   . GERD (gastroesophageal reflux disease)     OCCASIONAL  . Enlarged prostate   . Cancer     HX SKIN CANCER    HPI:    Nathan Reilly, 74 y.o. male, has a history of pain and functional disability in the left knee due to arthritis and has failed non-surgical conservative treatments for greater than 12 weeks to include NSAID's and/or analgesics, corticosteriod injections, viscosupplementation injections and activity modification. Onset of symptoms was gradual, starting ~1 years ago with gradually worsening course since that time. The patient noted no past surgery on the left knee(s). Patient currently rates pain in the left knee(s) at 9 out of 10 with activity. Patient has night pain, worsening of pain with activity and weight bearing, pain that interferes with activities of daily living and pain with passive range of motion. Patient has evidence of periarticular osteophytes and joint space narrowing by imaging studies. There is no active infection. Risks, benefits and expectations were discussed with the patient. Risks including but not limited to the risk of anesthesia, blood clots, nerve damage, blood vessel damage, failure of the prosthesis, infection and up to and including death. Patient understand the risks, benefits and expectations and wishes  to proceed with surgery.  PCP: Nathan Millers, MD   Discharged Condition: good  Hospital Course:  Patient underwent the above stated procedure on 07/26/2014. Patient tolerated the procedure well and brought to the recovery room in good condition and subsequently to the floor.  POD #1 BP: 136/81 ; Pulse: 58 ; Temp: 97.8 F (36.6 C) ; Resp: 14 Patient reports pain as mild, pain controlled. No events throughout the night. Concerned regarding being able to do everything he has to at home. Discussion was had and they will see how well he does with PT, but would like to go home if he does well. Dorsiflexion/plantar flexion intact, incision: dressing C/D/I, no cellulitis present and compartment soft.   LABS  Basename    HGB  12.5  HCT  37.1   POD #2  BP: 139/77 ; Pulse: 68 ; Temp: 98.2 F (36.8 C) ; Resp: 16 Patient reports pain as mild, pain controlled. No events throughout the night. He has enjoyed his whole experience at the hospital. Feels confident after PT that he will do well at home. Ready to be discharged home. Dorsiflexion/plantar flexion intact, incision: dressing C/D/I, no cellulitis present and compartment soft.   LABS  Basename    HGB  12.1  HCT  36.3    Discharge Exam: General appearance: alert, cooperative and no distress Extremities: Homans sign is negative, no sign of DVT, no edema, redness or tenderness in the calves or thighs and no ulcers, gangrene or trophic changes  Disposition: Home with follow up in 2 weeks   Follow-up  Information    Follow up with Nathan Pole, MD. Schedule an appointment as soon as possible for a visit in 2 weeks.   Specialty:  Orthopedic Surgery   Contact information:   [address] [place] [address] [phone]       Follow up with [facility].   Why:  home health physical therapy   Contact information:   [address] [place] [address] [phone]       Discharge  Instructions    Call MD / Call 911    Complete by:  As directed   If you experience chest pain or shortness of breath, CALL 911 and be transported to the hospital emergency room.  If you develope a fever above 101 F, pus (white drainage) or increased drainage or redness at the wound, or calf pain, call your surgeon's office.     Change dressing    Complete by:  As directed   Maintain surgical dressing until follow up in the clinic. If the edges start to pull up, may reinforce with tape. If the dressing is no longer working, may remove and cover with gauze and tape, but must keep the area dry and clean.  Call with any questions or concerns.     Constipation Prevention    Complete by:  As directed   Drink plenty of fluids.  Prune juice may be helpful.  You may use a stool softener, such as Colace (over the counter) 100 mg twice a day.  Use MiraLax (over the counter) for constipation as needed.     Diet - low sodium heart healthy    Complete by:  As directed      Discharge instructions    Complete by:  As directed   Maintain surgical dressing until follow up in the clinic. If the edges start to pull up, may reinforce with tape. If the dressing is no longer working, may remove and cover with gauze and tape, but must keep the area dry and clean.  Follow up in 2 weeks at [facility]. Call with any questions or concerns.     Increase activity slowly as tolerated    Complete by:  As directed      TED hose    Complete by:  As directed   Use stockings (TED hose) for 2 weeks on both leg(s).  You may remove them at night for sleeping.     Weight bearing as tolerated    Complete by:  As directed   Laterality:  left  Extremity:  Lower             Medication List    TAKE these medications        aspirin 325 MG EC tablet  Take 1 tablet (325 mg total) by mouth 2 (two) times daily.     cetaphil cream  Apply 1 application topically daily.     docusate sodium 100 MG capsule  Commonly  known as:  COLACE  Take 1 capsule (100 mg total) by mouth 2 (two) times daily.     doxazosin 4 MG tablet  Commonly known as:  CARDURA  Take 1 tablet (4 mg total) by mouth daily.     ferrous sulfate 325 (65 FE) MG tablet  Take 1 tablet (325 mg total) by mouth 3 (three) times daily after meals.     fluticasone 50 MCG/ACT nasal spray  Commonly known as:  FLONASE  Place 2 sprays into both  nostrils daily.     HYDROcodone-acetaminophen 7.5-325 MG per tablet  Commonly known as:  NORCO  Take 1-2 tablets by mouth every 4 (four) hours as needed for moderate pain.     lisinopril 10 MG tablet  Commonly known as:  PRINIVIL,ZESTRIL  Take 1 tablet (10 mg total) by mouth daily.     Polyethyl Glycol-Propyl Glycol 0.4-0.3 % Gel  Apply 1 drop to eye daily.     polyethylene glycol packet  Commonly known as:  MIRALAX / GLYCOLAX  Take 17 g by mouth 2 (two) times daily.     sildenafil 100 MG tablet  Commonly known as:  VIAGRA  TAKE 0.5-1 TABLETS (50-100 MG TOTAL) BY MOUTH DAILY AS NEEDED FOR ERECTILE DYSFUNCTION.     tiZANidine 4 MG tablet  Commonly known as:  ZANAFLEX  Take 1 tablet (4 mg total) by mouth every 6 (six) hours as needed for muscle spasms.         Signed: West Pugh. Bonney Berres   PA-C  07/28/2014, 8:50 AM

## 2014-07-29 ENCOUNTER — Telehealth: Payer: Self-pay | Admitting: *Deleted

## 2014-07-29 NOTE — Telephone Encounter (Signed)
Pt was on TCM report d/c hosp 07/28/14 had total left knee surgery. Will f/u woth Dr. Alvan Dame @ [facility]...Nathan Reilly

## 2014-07-30 DIAGNOSIS — Z471 Aftercare following joint replacement surgery: Secondary | ICD-10-CM | POA: Diagnosis not present

## 2014-07-30 DIAGNOSIS — I10 Essential (primary) hypertension: Secondary | ICD-10-CM | POA: Diagnosis not present

## 2014-07-30 DIAGNOSIS — Z96652 Presence of left artificial knee joint: Secondary | ICD-10-CM | POA: Diagnosis not present

## 2014-08-02 DIAGNOSIS — Z96652 Presence of left artificial knee joint: Secondary | ICD-10-CM | POA: Diagnosis not present

## 2014-08-02 DIAGNOSIS — Z471 Aftercare following joint replacement surgery: Secondary | ICD-10-CM | POA: Diagnosis not present

## 2014-08-02 DIAGNOSIS — I10 Essential (primary) hypertension: Secondary | ICD-10-CM | POA: Diagnosis not present

## 2014-08-03 DIAGNOSIS — Z471 Aftercare following joint replacement surgery: Secondary | ICD-10-CM | POA: Diagnosis not present

## 2014-08-03 DIAGNOSIS — Z96652 Presence of left artificial knee joint: Secondary | ICD-10-CM | POA: Diagnosis not present

## 2014-08-03 DIAGNOSIS — I10 Essential (primary) hypertension: Secondary | ICD-10-CM | POA: Diagnosis not present

## 2014-08-04 DIAGNOSIS — Z471 Aftercare following joint replacement surgery: Secondary | ICD-10-CM | POA: Diagnosis not present

## 2014-08-04 DIAGNOSIS — I10 Essential (primary) hypertension: Secondary | ICD-10-CM | POA: Diagnosis not present

## 2014-08-04 DIAGNOSIS — Z96652 Presence of left artificial knee joint: Secondary | ICD-10-CM | POA: Diagnosis not present

## 2014-08-07 DIAGNOSIS — I10 Essential (primary) hypertension: Secondary | ICD-10-CM | POA: Diagnosis not present

## 2014-08-07 DIAGNOSIS — Z471 Aftercare following joint replacement surgery: Secondary | ICD-10-CM | POA: Diagnosis not present

## 2014-08-07 DIAGNOSIS — Z96652 Presence of left artificial knee joint: Secondary | ICD-10-CM | POA: Diagnosis not present

## 2014-08-09 DIAGNOSIS — Z471 Aftercare following joint replacement surgery: Secondary | ICD-10-CM | POA: Diagnosis not present

## 2014-08-09 DIAGNOSIS — Z96652 Presence of left artificial knee joint: Secondary | ICD-10-CM | POA: Diagnosis not present

## 2014-08-09 DIAGNOSIS — I10 Essential (primary) hypertension: Secondary | ICD-10-CM | POA: Diagnosis not present

## 2014-08-11 DIAGNOSIS — I10 Essential (primary) hypertension: Secondary | ICD-10-CM | POA: Diagnosis not present

## 2014-08-11 DIAGNOSIS — Z96652 Presence of left artificial knee joint: Secondary | ICD-10-CM | POA: Diagnosis not present

## 2014-08-11 DIAGNOSIS — Z471 Aftercare following joint replacement surgery: Secondary | ICD-10-CM | POA: Diagnosis not present

## 2014-08-19 DIAGNOSIS — M1712 Unilateral primary osteoarthritis, left knee: Secondary | ICD-10-CM | POA: Diagnosis not present

## 2014-08-22 DIAGNOSIS — M1712 Unilateral primary osteoarthritis, left knee: Secondary | ICD-10-CM | POA: Diagnosis not present

## 2014-08-24 DIAGNOSIS — M1712 Unilateral primary osteoarthritis, left knee: Secondary | ICD-10-CM | POA: Diagnosis not present

## 2014-08-29 DIAGNOSIS — M1712 Unilateral primary osteoarthritis, left knee: Secondary | ICD-10-CM | POA: Diagnosis not present

## 2014-08-31 DIAGNOSIS — M1712 Unilateral primary osteoarthritis, left knee: Secondary | ICD-10-CM | POA: Diagnosis not present

## 2014-09-05 DIAGNOSIS — M1712 Unilateral primary osteoarthritis, left knee: Secondary | ICD-10-CM | POA: Diagnosis not present

## 2014-09-06 DIAGNOSIS — Z471 Aftercare following joint replacement surgery: Secondary | ICD-10-CM | POA: Diagnosis not present

## 2014-09-06 DIAGNOSIS — Z96652 Presence of left artificial knee joint: Secondary | ICD-10-CM | POA: Diagnosis not present

## 2014-09-07 DIAGNOSIS — M1712 Unilateral primary osteoarthritis, left knee: Secondary | ICD-10-CM | POA: Diagnosis not present

## 2014-09-12 ENCOUNTER — Other Ambulatory Visit: Payer: Self-pay | Admitting: Internal Medicine

## 2014-09-12 NOTE — Telephone Encounter (Signed)
Patient establishing with you this month. Okay to put refill in your name?

## 2014-09-14 DIAGNOSIS — K641 Second degree hemorrhoids: Secondary | ICD-10-CM | POA: Diagnosis not present

## 2014-09-14 DIAGNOSIS — K625 Hemorrhage of anus and rectum: Secondary | ICD-10-CM | POA: Diagnosis not present

## 2014-09-21 DIAGNOSIS — D225 Melanocytic nevi of trunk: Secondary | ICD-10-CM | POA: Diagnosis not present

## 2014-09-21 DIAGNOSIS — L570 Actinic keratosis: Secondary | ICD-10-CM | POA: Diagnosis not present

## 2014-09-21 DIAGNOSIS — L821 Other seborrheic keratosis: Secondary | ICD-10-CM | POA: Diagnosis not present

## 2014-09-21 DIAGNOSIS — Z85828 Personal history of other malignant neoplasm of skin: Secondary | ICD-10-CM | POA: Diagnosis not present

## 2014-09-28 ENCOUNTER — Ambulatory Visit (INDEPENDENT_AMBULATORY_CARE_PROVIDER_SITE_OTHER): Payer: Medicare Other | Admitting: Internal Medicine

## 2014-09-28 ENCOUNTER — Encounter: Payer: Self-pay | Admitting: Internal Medicine

## 2014-09-28 VITALS — BP 124/84 | HR 74 | Temp 98.1°F | Resp 16 | Ht 69.0 in | Wt 175.0 lb

## 2014-09-28 DIAGNOSIS — I10 Essential (primary) hypertension: Secondary | ICD-10-CM

## 2014-09-28 DIAGNOSIS — K641 Second degree hemorrhoids: Secondary | ICD-10-CM | POA: Diagnosis not present

## 2014-09-28 DIAGNOSIS — Z0000 Encounter for general adult medical examination without abnormal findings: Secondary | ICD-10-CM

## 2014-09-28 DIAGNOSIS — E663 Overweight: Secondary | ICD-10-CM

## 2014-09-28 NOTE — Progress Notes (Signed)
Pre visit review using our clinic review tool, if applicable. No additional management support is needed unless otherwise documented below in the visit note. 

## 2014-09-28 NOTE — Patient Instructions (Signed)
We have made sure that you have refills of your medicines today. You do not need any blood work.   Keep up the good work with your health and see you back in 1 year. If you have any problems or questions please feel free to call the office.   Health Maintenance A healthy lifestyle and preventative care can promote health and wellness.  Maintain regular health, dental, and eye exams.  Eat a healthy diet. Foods like vegetables, fruits, whole grains, low-fat dairy products, and lean protein foods contain the nutrients you need and are low in calories. Decrease your intake of foods high in solid fats, added sugars, and salt. Get information about a proper diet from your health care provider, if necessary.  Regular physical exercise is one of the most important things you can do for your health. Most adults should get at least 150 minutes of moderate-intensity exercise (any activity that increases your heart rate and causes you to sweat) each week. In addition, most adults need muscle-strengthening exercises on 2 or more days a week.   Maintain a healthy weight. The body mass index (BMI) is a screening tool to identify possible weight problems. It provides an estimate of body fat based on height and weight. Your health care provider can find your BMI and can help you achieve or maintain a healthy weight. For males 20 years and older:  A BMI below 18.5 is considered underweight.  A BMI of 18.5 to 24.9 is normal.  A BMI of 25 to 29.9 is considered overweight.  A BMI of 30 and above is considered obese.  Maintain normal blood lipids and cholesterol by exercising and minimizing your intake of saturated fat. Eat a balanced diet with plenty of fruits and vegetables. Blood tests for lipids and cholesterol should begin at age 72 and be repeated every 5 years. If your lipid or cholesterol levels are high, you are over age 78, or you are at high risk for heart disease, you may need your cholesterol levels  checked more frequently.Ongoing high lipid and cholesterol levels should be treated with medicines if diet and exercise are not working.  If you smoke, find out from your health care provider how to quit. If you do not use tobacco, do not start.  Lung cancer screening is recommended for adults aged 36-80 years who are at high risk for developing lung cancer because of a history of smoking. A yearly low-dose CT scan of the lungs is recommended for people who have at least a 30-pack-year history of smoking and are current smokers or have quit within the past 15 years. A pack year of smoking is smoking an average of 1 pack of cigarettes a day for 1 year (for example, a 30-pack-year history of smoking could mean smoking 1 pack a day for 30 years or 2 packs a day for 15 years). Yearly screening should continue until the smoker has stopped smoking for at least 15 years. Yearly screening should be stopped for people who develop a health problem that would prevent them from having lung cancer treatment.  If you choose to drink alcohol, do not have more than 2 drinks per day. One drink is considered to be 12 oz (360 mL) of beer, 5 oz (150 mL) of wine, or 1.5 oz (45 mL) of liquor.  Avoid the use of street drugs. Do not share needles with anyone. Ask for help if you need support or instructions about stopping the use of drugs.  High blood pressure causes heart disease and increases the risk of stroke. Blood pressure should be checked at least every 1-2 years. Ongoing high blood pressure should be treated with medicines if weight loss and exercise are not effective.  If you are 2-37 years old, ask your health care provider if you should take aspirin to prevent heart disease.  Diabetes screening involves taking a blood sample to check your fasting blood sugar level. This should be done once every 3 years after age 28 if you are at a normal weight and without risk factors for diabetes. Testing should be considered  at a younger age or be carried out more frequently if you are overweight and have at least 1 risk factor for diabetes.  Colorectal cancer can be detected and often prevented. Most routine colorectal cancer screening begins at the age of 23 and continues through age 30. However, your health care provider may recommend screening at an earlier age if you have risk factors for colon cancer. On a yearly basis, your health care provider may provide home test kits to check for hidden blood in the stool. A small camera at the end of a tube may be used to directly examine the colon (sigmoidoscopy or colonoscopy) to detect the earliest forms of colorectal cancer. Talk to your health care provider about this at age 30 when routine screening begins. A direct exam of the colon should be repeated every 5-10 years through age 60, unless early forms of precancerous polyps or small growths are found.  People who are at an increased risk for hepatitis B should be screened for this virus. You are considered at high risk for hepatitis B if:  You were born in a country where hepatitis B occurs often. Talk with your health care provider about which countries are considered high risk.  Your parents were born in a high-risk country and you have not received a shot to protect against hepatitis B (hepatitis B vaccine).  You have HIV or AIDS.  You use needles to inject street drugs.  You live with, or have sex with, someone who has hepatitis B.  You are a man who has sex with other men (MSM).  You get hemodialysis treatment.  You take certain medicines for conditions like cancer, organ transplantation, and autoimmune conditions.  Hepatitis C blood testing is recommended for all people born from [birth year] through 1965 and any individual with known risk factors for hepatitis C.  Healthy men should no longer receive prostate-specific antigen (PSA) blood tests as part of routine cancer screening. Talk to your health care  provider about prostate cancer screening.  Testicular cancer screening is not recommended for adolescents or adult males who have no symptoms. Screening includes self-exam, a health care provider exam, and other screening tests. Consult with your health care provider about any symptoms you have or any concerns you have about testicular cancer.  Practice safe sex. Use condoms and avoid high-risk sexual practices to reduce the spread of sexually transmitted infections (STIs).  You should be screened for STIs, including gonorrhea and chlamydia if:  You are sexually active and are younger than 24 years.  You are older than 24 years, and your health care provider tells you that you are at risk for this type of infection.  Your sexual activity has changed since you were last screened, and you are at an increased risk for chlamydia or gonorrhea. Ask your health care provider if you are at risk.  If you are  at risk of being infected with HIV, it is recommended that you take a prescription medicine daily to prevent HIV infection. This is called pre-exposure prophylaxis (PrEP). You are considered at risk if:  You are a man who has sex with other men (MSM).  You are a heterosexual man who is sexually active with multiple partners.  You take drugs by injection.  You are sexually active with a partner who has HIV.  Talk with your health care provider about whether you are at high risk of being infected with HIV. If you choose to begin PrEP, you should first be tested for HIV. You should then be tested every 3 months for as long as you are taking PrEP.  Use sunscreen. Apply sunscreen liberally and repeatedly throughout the day. You should seek shade when your shadow is shorter than you. Protect yourself by wearing long sleeves, pants, a wide-brimmed hat, and sunglasses year round whenever you are outdoors.  Tell your health care provider of new moles or changes in moles, especially if there is a change  in shape or color. Also, tell your health care provider if a mole is larger than the size of a pencil eraser.  A one-time screening for abdominal aortic aneurysm (AAA) and surgical repair of large AAAs by ultrasound is recommended for men aged 57-75 years who are current or former smokers.  Stay current with your vaccines (immunizations). Document Released: 11/23/2007 Document Revised: 06/01/2013 Document Reviewed: 10/22/2010 [facility] Patient Information 2015 [place], [place]. This information is not intended to replace advice given to you by your health care provider. Make sure you discuss any questions you have with your health care provider.

## 2014-09-29 ENCOUNTER — Encounter: Payer: Self-pay | Admitting: Internal Medicine

## 2014-09-29 NOTE — Assessment & Plan Note (Signed)
BP at goal on low dose lisinopril and doxazosin. Check labs and adjust as indicated.

## 2014-09-29 NOTE — Assessment & Plan Note (Signed)
Pneumonia series complete, shingles complete, tetanus due in 2021. Colonscopy due in 2024 but may have aged out of screening by then. 10 year list of screening recommendations given to him at visit. Checking routine labs. Advised him since he has had skin cancer in the past that it is doubly importance to use appropriate sunscreen and protective clothing.

## 2014-09-29 NOTE — Assessment & Plan Note (Signed)
He is working on exercise now that he has had knee replacement.

## 2014-09-29 NOTE — Progress Notes (Signed)
   Subjective:    Patient ID: Nathan Reilly, male    DOB: [birth year], 74 y.o.   MRN: [id number]  HPI Here for medicare wellness, no new complaints. Please see A/P for status and treatment of chronic medical problems. He is S/P TKR earlier this year.   Diet: heart healthy Physical activity: sedentary Depression/mood screen: negative Hearing: intact to whispered voice Visual acuity: grossly normal, performs annual eye exam  ADLs: capable Fall risk: none Home safety: good Cognitive evaluation: intact to orientation, naming, recall and repetition EOL planning: adv directives discussed  I have personally reviewed and have noted 1. The patient's medical and social history - reviewed today no changes 2. Their use of alcohol, tobacco or illicit drugs 3. Their current medications and supplements 4. The patient's functional ability including ADL's, fall risks, home safety risks and hearing or visual impairment. 5. Diet and physical activities 6. Evidence for depression or mood disorders 7. Care team reviewed and updated (available in snapshot)  Review of Systems  Constitutional: Positive for activity change. Negative for fever, appetite change and fatigue.       Getting back around since knee replacement  HENT: Negative.   Eyes: Negative.   Respiratory: Negative for cough, chest tightness and shortness of breath.   Cardiovascular: Negative for chest pain, palpitations and leg swelling.  Gastrointestinal: Negative for abdominal pain, diarrhea, constipation and abdominal distention.  Musculoskeletal: Positive for arthralgias.  Skin: Negative.   Neurological: Negative.   Psychiatric/Behavioral: Negative.       Objective:   Physical Exam  Constitutional: He is oriented to person, place, and time. He appears well-developed and well-nourished.  HENT:  Head: Normocephalic and atraumatic.  Eyes: EOM are normal.  Neck: Normal range of motion.  Cardiovascular: Normal rate and regular rhythm.    Pulmonary/Chest: Effort normal. No respiratory distress. He has no wheezes. He has no rales.  Abdominal: Soft. Bowel sounds are normal. He exhibits no distension. There is no tenderness. There is no rebound.  Musculoskeletal: He exhibits no edema.  Neurological: He is alert and oriented to person, place, and time. Coordination normal.  Skin: Skin is warm and dry.   Filed Vitals:   09/28/14 0909  BP: 124/84  Pulse: 74  Temp: 98.1 F (36.7 C)  TempSrc: Oral  Resp: 16  Height: 5\' 9"  (1.753 m)  Weight: 175 lb (79.379 kg)  SpO2: 98%      Assessment & Plan:

## 2014-10-12 DIAGNOSIS — K641 Second degree hemorrhoids: Secondary | ICD-10-CM | POA: Diagnosis not present

## 2014-11-16 ENCOUNTER — Other Ambulatory Visit: Payer: Self-pay | Admitting: Geriatric Medicine

## 2014-11-16 MED ORDER — DOXAZOSIN MESYLATE 4 MG PO TABS: 4.0000 mg | ORAL_TABLET | Freq: Every day | ORAL | Status: DC

## 2015-03-22 DIAGNOSIS — M1711 Unilateral primary osteoarthritis, right knee: Secondary | ICD-10-CM | POA: Diagnosis not present

## 2015-07-04 ENCOUNTER — Ambulatory Visit (INDEPENDENT_AMBULATORY_CARE_PROVIDER_SITE_OTHER): Payer: Medicare Other | Admitting: Internal Medicine

## 2015-07-04 ENCOUNTER — Other Ambulatory Visit (INDEPENDENT_AMBULATORY_CARE_PROVIDER_SITE_OTHER): Payer: Medicare Other

## 2015-07-04 ENCOUNTER — Encounter: Payer: Self-pay | Admitting: Internal Medicine

## 2015-07-04 VITALS — BP 90/62 | HR 75 | Temp 97.9°F | Resp 16 | Ht 69.5 in | Wt 181.8 lb

## 2015-07-04 DIAGNOSIS — Z0000 Encounter for general adult medical examination without abnormal findings: Secondary | ICD-10-CM | POA: Diagnosis not present

## 2015-07-04 DIAGNOSIS — R42 Dizziness and giddiness: Secondary | ICD-10-CM

## 2015-07-04 DIAGNOSIS — R972 Elevated prostate specific antigen [PSA]: Secondary | ICD-10-CM

## 2015-07-04 LAB — COMPREHENSIVE METABOLIC PANEL
ALT: 22 U/L (ref 0–53)
AST: 21 U/L (ref 0–37)
Albumin: 4.3 g/dL (ref 3.5–5.2)
Alkaline Phosphatase: 74 U/L (ref 39–117)
BUN: 18 mg/dL (ref 6–23)
CHLORIDE: 104 meq/L (ref 96–112)
CO2: 26 mEq/L (ref 19–32)
Calcium: 9.3 mg/dL (ref 8.4–10.5)
Creatinine, Ser: 1.12 mg/dL (ref 0.40–1.50)
GFR: 67.95 mL/min (ref >60.00)
GLUCOSE: 132 mg/dL — AB (ref 70–99)
POTASSIUM: 4.3 meq/L (ref 3.5–5.1)
SODIUM: 140 meq/L (ref 135–145)
Total Bilirubin: 0.6 mg/dL (ref 0.2–1.2)
Total Protein: 6.9 g/dL (ref 6.0–8.3)

## 2015-07-04 LAB — CBC
HEMATOCRIT: 47.2 % (ref 39.0–52.0)
Hemoglobin: 15.6 g/dL (ref 13.0–17.0)
MCHC: 33.1 g/dL (ref 30.0–36.0)
MCV: 93.1 fl (ref 78.0–100.0)
Platelets: 178 10*3/uL (ref 150.0–400.0)
RBC: 5.07 Mil/uL (ref 4.22–5.81)
RDW: 13.6 % (ref 11.5–15.5)
WBC: 7 10*3/uL (ref 4.0–10.5)

## 2015-07-04 LAB — LIPID PANEL
Cholesterol: 177 mg/dL (ref 0–200)
HDL: 63.9 mg/dL (ref >39.00)
LDL Cholesterol: 96 mg/dL (ref 0–99)
NonHDL: 113.34
Total CHOL/HDL Ratio: 3
Triglycerides: 88 mg/dL (ref 0.0–149.0)
VLDL: 17.6 mg/dL (ref 0.0–40.0)

## 2015-07-04 LAB — PSA: PSA: 2.1 ng/mL (ref 0.10–4.00)

## 2015-07-04 LAB — HEMOGLOBIN A1C: HEMOGLOBIN A1C: 5.5 % (ref 4.6–6.5)

## 2015-07-04 LAB — TSH: TSH: 5.96 u[IU]/mL — ABNORMAL HIGH (ref 0.35–4.50)

## 2015-07-04 NOTE — Progress Notes (Signed)
   Subjective:    Patient ID: Nathan Reilly, male    DOB: 04/03/1941, 75 y.o.   MRN: [id number]  HPI The patient is a 75 YO man coming in for dizziness symptoms. About 1 week ago he had an episode when he awoke. He stood and felt very lightheaded and had to sit back down. He denies syncope. Was not feeling bad at the time and no GI symptoms. Had been eating and drinking normally. Some fatigue since that time and a few milder episodes which passed in 1-2 minutes. No SOB and no blood that he has noticed.   Review of Systems  Constitutional: Positive for activity change and fatigue. Negative for fever and appetite change.  HENT: Negative.   Eyes: Negative.   Respiratory: Negative for cough, chest tightness and shortness of breath.   Cardiovascular: Negative for chest pain, palpitations and leg swelling.  Gastrointestinal: Negative for abdominal pain, diarrhea, constipation and abdominal distention.  Skin: Negative.   Neurological: Positive for dizziness and light-headedness. Negative for seizures, syncope, speech difficulty, weakness, numbness and headaches.  Psychiatric/Behavioral: Negative.       Objective:   Physical Exam  Constitutional: He is oriented to person, place, and time. He appears well-developed and well-nourished.  HENT:  Head: Normocephalic and atraumatic.  Eyes: EOM are normal.  Neck: Normal range of motion.  Cardiovascular: Normal rate and regular rhythm.   Carotids without bruit  Pulmonary/Chest: Effort normal. No respiratory distress. He has no wheezes. He has no rales.  Abdominal: Soft. Bowel sounds are normal. He exhibits no distension. There is no tenderness. There is no rebound.  Musculoskeletal: He exhibits no edema.  Neurological: He is alert and oriented to person, place, and time. Coordination normal.  Skin: Skin is warm and dry.   Filed Vitals:   07/04/15 0852 07/04/15 0856  BP: 110/68 90/62  Pulse: 72 75  Temp: 97.9 F (36.6 C)   TempSrc: Oral   Resp:  16   Height: 5' 9.5" (1.765 m)   Weight: 181 lb 12.8 oz (82.464 kg)   SpO2: 98%       Assessment & Plan:

## 2015-07-04 NOTE — Progress Notes (Signed)
Pre visit review using our clinic review tool, if applicable. No additional management support is needed unless otherwise documented below in the visit note. 

## 2015-07-04 NOTE — Patient Instructions (Signed)
We are checking your labs today and will call you back about the results.  What might help is to eat a little bit more salt in your diet to help keep the blood pressure up when you stand better.

## 2015-07-04 NOTE — Assessment & Plan Note (Signed)
1 episode that was severe and several milder episodes. Checking labs today including thyroid, CBC, CMP to ensure to metabolic abnormality that caused. Advised to liberalize salt intake some. If still having symptoms could stop his lisinopril.

## 2015-07-19 DIAGNOSIS — Z471 Aftercare following joint replacement surgery: Secondary | ICD-10-CM | POA: Diagnosis not present

## 2015-07-19 DIAGNOSIS — M25561 Pain in right knee: Secondary | ICD-10-CM | POA: Diagnosis not present

## 2015-07-19 DIAGNOSIS — Z96652 Presence of left artificial knee joint: Secondary | ICD-10-CM | POA: Diagnosis not present

## 2015-08-21 ENCOUNTER — Other Ambulatory Visit: Payer: Self-pay | Admitting: Internal Medicine

## 2015-09-07 ENCOUNTER — Ambulatory Visit
Admission: RE | Admit: 2015-09-07 | Discharge: 2015-09-07 | Disposition: A | Discharge status: Home / Self Care | Payer: Medicare Other | Source: Ambulatory Visit | Attending: Gastroenterology | Admitting: Gastroenterology

## 2015-09-07 ENCOUNTER — Telehealth: Payer: Self-pay | Admitting: Internal Medicine

## 2015-09-07 ENCOUNTER — Other Ambulatory Visit: Payer: Self-pay | Admitting: Gastroenterology

## 2015-09-07 DIAGNOSIS — R509 Fever, unspecified: Secondary | ICD-10-CM | POA: Diagnosis not present

## 2015-09-07 DIAGNOSIS — K5730 Diverticulosis of large intestine without perforation or abscess without bleeding: Secondary | ICD-10-CM | POA: Diagnosis not present

## 2015-09-07 DIAGNOSIS — D122 Benign neoplasm of ascending colon: Secondary | ICD-10-CM | POA: Diagnosis not present

## 2015-09-07 DIAGNOSIS — Z7689 Persons encountering health services in other specified circumstances: Secondary | ICD-10-CM | POA: Diagnosis not present

## 2015-09-07 DIAGNOSIS — Z800 Family history of malignant neoplasm of digestive organs: Secondary | ICD-10-CM | POA: Diagnosis not present

## 2015-09-07 DIAGNOSIS — Z86010 Personal history of colonic polyps: Secondary | ICD-10-CM | POA: Diagnosis not present

## 2015-09-07 DIAGNOSIS — K5790 Diverticulosis of intestine, part unspecified, without perforation or abscess without bleeding: Secondary | ICD-10-CM | POA: Diagnosis not present

## 2015-09-07 LAB — HM COLONOSCOPY

## 2015-09-07 NOTE — Telephone Encounter (Signed)
Dr. Dellis Filbert [facility] office called to let you know he came in for a colonoscopy and they did a tubal abdomin scan and he has aspiration pneumonia and he prescribed pt Keflex

## 2015-09-21 DIAGNOSIS — L570 Actinic keratosis: Secondary | ICD-10-CM | POA: Diagnosis not present

## 2015-09-21 DIAGNOSIS — Z85828 Personal history of other malignant neoplasm of skin: Secondary | ICD-10-CM | POA: Diagnosis not present

## 2015-09-21 DIAGNOSIS — D225 Melanocytic nevi of trunk: Secondary | ICD-10-CM | POA: Diagnosis not present

## 2015-09-21 DIAGNOSIS — L821 Other seborrheic keratosis: Secondary | ICD-10-CM | POA: Diagnosis not present

## 2015-09-27 ENCOUNTER — Ambulatory Visit
Admission: RE | Admit: 2015-09-27 | Discharge: 2015-09-27 | Disposition: A | Discharge status: Home / Self Care | Payer: Medicare Other | Source: Ambulatory Visit | Attending: Gastroenterology | Admitting: Gastroenterology

## 2015-09-27 ENCOUNTER — Other Ambulatory Visit: Payer: Self-pay | Admitting: Gastroenterology

## 2015-09-27 ENCOUNTER — Encounter: Payer: Medicare Other | Admitting: Internal Medicine

## 2015-09-27 DIAGNOSIS — J690 Pneumonitis due to inhalation of food and vomit: Secondary | ICD-10-CM

## 2015-09-27 DIAGNOSIS — R918 Other nonspecific abnormal finding of lung field: Secondary | ICD-10-CM | POA: Diagnosis not present

## 2015-10-04 ENCOUNTER — Encounter: Payer: Medicare Other | Admitting: Internal Medicine

## 2015-10-15 ENCOUNTER — Other Ambulatory Visit: Payer: Self-pay | Admitting: Internal Medicine

## 2015-10-19 ENCOUNTER — Encounter: Payer: Self-pay | Admitting: Geriatric Medicine

## 2015-11-08 ENCOUNTER — Encounter: Payer: Medicare Other | Admitting: Internal Medicine

## 2015-11-08 DIAGNOSIS — L570 Actinic keratosis: Secondary | ICD-10-CM | POA: Diagnosis not present

## 2015-11-08 DIAGNOSIS — D485 Neoplasm of uncertain behavior of skin: Secondary | ICD-10-CM | POA: Diagnosis not present

## 2015-11-08 DIAGNOSIS — L821 Other seborrheic keratosis: Secondary | ICD-10-CM | POA: Diagnosis not present

## 2015-11-15 ENCOUNTER — Ambulatory Visit (INDEPENDENT_AMBULATORY_CARE_PROVIDER_SITE_OTHER): Payer: Medicare Other | Admitting: Internal Medicine

## 2015-11-15 ENCOUNTER — Encounter: Payer: Self-pay | Admitting: Internal Medicine

## 2015-11-15 VITALS — BP 168/98 | HR 58 | Temp 98.6°F | Resp 16 | Ht 69.0 in | Wt 179.0 lb

## 2015-11-15 DIAGNOSIS — I10 Essential (primary) hypertension: Secondary | ICD-10-CM

## 2015-11-15 DIAGNOSIS — Z0000 Encounter for general adult medical examination without abnormal findings: Secondary | ICD-10-CM

## 2015-11-15 MED ORDER — SILDENAFIL CITRATE 20 MG PO TABS: 20.0000 mg | ORAL_TABLET | Freq: Three times a day (TID) | ORAL | Status: DC | PRN

## 2015-11-15 MED ORDER — LISINOPRIL 10 MG PO TABS: 10.0000 mg | ORAL_TABLET | Freq: Every day | ORAL | Status: DC

## 2015-11-15 MED ORDER — DOXAZOSIN MESYLATE 4 MG PO TABS: 4.0000 mg | ORAL_TABLET | Freq: Every day | ORAL | Status: DC

## 2015-11-15 MED ORDER — FLUTICASONE PROPIONATE 50 MCG/ACT NA SUSP: 2.0000 | Freq: Every day | NASAL | Status: DC

## 2015-11-15 NOTE — Progress Notes (Signed)
   Subjective:    Patient ID: Nathan Reilly, male    DOB: [birth year], 75 y.o.   MRN: [id number]  HPI Here for medicare wellness, no new complaints. Please see A/P for status and treatment of chronic medical problems.   Diet: heart healthy  Physical activity: activity good Depression/mood screen: negative Hearing: intact to whispered voice, mild loss Visual acuity: grossly normal with lens, performs annual eye exam  ADLs: capable Fall risk: none Home safety: good Cognitive evaluation: intact to orientation, naming, recall and repetition EOL planning: adv directives discussed  I have personally reviewed and have noted 1. The patient's medical and social history - reviewed today no changes 2. Their use of alcohol, tobacco or illicit drugs 3. Their current medications and supplements 4. The patient's functional ability including ADL's, fall risks, home safety risks and hearing or visual impairment. 5. Diet and physical activities 6. Evidence for depression or mood disorders 7. Care team reviewed and updated (available in snapshot)  Review of Systems  Constitutional: Negative for fever, activity change, appetite change and fatigue.  HENT: Negative.   Eyes: Negative.   Respiratory: Negative for cough, chest tightness and shortness of breath.   Cardiovascular: Negative for chest pain, palpitations and leg swelling.  Gastrointestinal: Negative for abdominal pain, diarrhea, constipation and abdominal distention.  Musculoskeletal: Negative.   Skin: Negative.   Neurological: Negative for dizziness, seizures, syncope, speech difficulty, weakness, light-headedness, numbness and headaches.  Psychiatric/Behavioral: Negative.       Objective:   Physical Exam  Constitutional: He is oriented to person, place, and time. He appears well-developed and well-nourished.  HENT:  Head: Normocephalic and atraumatic.  Eyes: EOM are normal.  Neck: Normal range of motion.  Cardiovascular: Normal  rate and regular rhythm.   Pulmonary/Chest: Effort normal. No respiratory distress. He has no wheezes. He has no rales.  Abdominal: Soft. Bowel sounds are normal. He exhibits no distension. There is no tenderness. There is no rebound.  Musculoskeletal: He exhibits no edema.  Neurological: He is alert and oriented to person, place, and time. Coordination normal.  Skin: Skin is warm and dry.   Filed Vitals:   11/15/15 0929  BP: 178/98  Pulse: 58  Temp: 98.6 F (37 C)  TempSrc: Oral  Resp: 16  Height: 5\' 9"  (1.753 m)  Weight: 179 lb (81.194 kg)  SpO2: 98%      Assessment & Plan:

## 2015-11-15 NOTE — Progress Notes (Signed)
Pre visit review using our clinic review tool, if applicable. No additional management support is needed unless otherwise documented below in the visit note. 

## 2015-11-15 NOTE — Patient Instructions (Signed)
We have given you the prescription for the generic of the viagra that you can get and use as needed. The pills are 20 mg and you have been taking about 25 mg so try using 1 pill and see how it works. You can increase to as much as 3 pills at one time if needed.   Health Maintenance, Male A healthy lifestyle and preventative care can promote health and wellness.  Maintain regular health, dental, and eye exams.  Eat a healthy diet. Foods like vegetables, fruits, whole grains, low-fat dairy products, and lean protein foods contain the nutrients you need and are low in calories. Decrease your intake of foods high in solid fats, added sugars, and salt. Get information about a proper diet from your health care provider, if necessary.  Regular physical exercise is one of the most important things you can do for your health. Most adults should get at least 150 minutes of moderate-intensity exercise (any activity that increases your heart rate and causes you to sweat) each week. In addition, most adults need muscle-strengthening exercises on 2 or more days a week.   Maintain a healthy weight. The body mass index (BMI) is a screening tool to identify possible weight problems. It provides an estimate of body fat based on height and weight. Your health care provider can find your BMI and can help you achieve or maintain a healthy weight. For males 20 years and older:  A BMI below 18.5 is considered underweight.  A BMI of 18.5 to 24.9 is normal.  A BMI of 25 to 29.9 is considered overweight.  A BMI of 30 and above is considered obese.  Maintain normal blood lipids and cholesterol by exercising and minimizing your intake of saturated fat. Eat a balanced diet with plenty of fruits and vegetables. Blood tests for lipids and cholesterol should begin at age 72 and be repeated every 5 years. If your lipid or cholesterol levels are high, you are over age 9, or you are at high risk for heart disease, you may need  your cholesterol levels checked more frequently.Ongoing high lipid and cholesterol levels should be treated with medicines if diet and exercise are not working.  If you smoke, find out from your health care provider how to quit. If you do not use tobacco, do not start.  Lung cancer screening is recommended for adults aged 17-80 years who are at high risk for developing lung cancer because of a history of smoking. A yearly low-dose CT scan of the lungs is recommended for people who have at least a 30-pack-year history of smoking and are current smokers or have quit within the past 15 years. A pack year of smoking is smoking an average of 1 pack of cigarettes a day for 1 year (for example, a 30-pack-year history of smoking could mean smoking 1 pack a day for 30 years or 2 packs a day for 15 years). Yearly screening should continue until the smoker has stopped smoking for at least 15 years. Yearly screening should be stopped for people who develop a health problem that would prevent them from having lung cancer treatment.  If you choose to drink alcohol, do not have more than 2 drinks per day. One drink is considered to be 12 oz (360 mL) of beer, 5 oz (150 mL) of wine, or 1.5 oz (45 mL) of liquor.  Avoid the use of street drugs. Do not share needles with anyone. Ask for help if you need support or  instructions about stopping the use of drugs.  High blood pressure causes heart disease and increases the risk of stroke. High blood pressure is more likely to develop in:  People who have blood pressure in the end of the normal range (100-139/85-89 mm Hg).  People who are overweight or obese.  People who are African American.  If you are 104-84 years of age, have your blood pressure checked every 3-5 years. If you are 52 years of age or older, have your blood pressure checked every year. You should have your blood pressure measured twice--once when you are at a hospital or clinic, and once when you are not  at a hospital or clinic. Record the average of the two measurements. To check your blood pressure when you are not at a hospital or clinic, you can use:  An automated blood pressure machine at a pharmacy.  A home blood pressure monitor.  If you are 62-2 years old, ask your health care provider if you should take aspirin to prevent heart disease.  Diabetes screening involves taking a blood sample to check your fasting blood sugar level. This should be done once every 3 years after age 11 if you are at a normal weight and without risk factors for diabetes. Testing should be considered at a younger age or be carried out more frequently if you are overweight and have at least 1 risk factor for diabetes.  Colorectal cancer can be detected and often prevented. Most routine colorectal cancer screening begins at the age of 37 and continues through age 70. However, your health care provider may recommend screening at an earlier age if you have risk factors for colon cancer. On a yearly basis, your health care provider may provide home test kits to check for hidden blood in the stool. A small camera at the end of a tube may be used to directly examine the colon (sigmoidoscopy or colonoscopy) to detect the earliest forms of colorectal cancer. Talk to your health care provider about this at age 69 when routine screening begins. A direct exam of the colon should be repeated every 5-10 years through age 59, unless early forms of precancerous polyps or small growths are found.  People who are at an increased risk for hepatitis B should be screened for this virus. You are considered at high risk for hepatitis B if:  You were born in a country where hepatitis B occurs often. Talk with your health care provider about which countries are considered high risk.  Your parents were born in a high-risk country and you have not received a shot to protect against hepatitis B (hepatitis B vaccine).  You have HIV or  AIDS.  You use needles to inject street drugs.  You live with, or have sex with, someone who has hepatitis B.  You are a man who has sex with other men (MSM).  You get hemodialysis treatment.  You take certain medicines for conditions like cancer, organ transplantation, and autoimmune conditions.  Hepatitis C blood testing is recommended for all people born from [birth year] through 1965 and any individual with known risk factors for hepatitis C.  Healthy men should no longer receive prostate-specific antigen (PSA) blood tests as part of routine cancer screening. Talk to your health care provider about prostate cancer screening.  Testicular cancer screening is not recommended for adolescents or adult males who have no symptoms. Screening includes self-exam, a health care provider exam, and other screening tests. Consult with your health care  provider about any symptoms you have or any concerns you have about testicular cancer.  Practice safe sex. Use condoms and avoid high-risk sexual practices to reduce the spread of sexually transmitted infections (STIs).  You should be screened for STIs, including gonorrhea and chlamydia if:  You are sexually active and are younger than 24 years.  You are older than 24 years, and your health care provider tells you that you are at risk for this type of infection.  Your sexual activity has changed since you were last screened, and you are at an increased risk for chlamydia or gonorrhea. Ask your health care provider if you are at risk.  If you are at risk of being infected with HIV, it is recommended that you take a prescription medicine daily to prevent HIV infection. This is called pre-exposure prophylaxis (PrEP). You are considered at risk if:  You are a man who has sex with other men (MSM).  You are a heterosexual man who is sexually active with multiple partners.  You take drugs by injection.  You are sexually active with a partner who has  HIV.  Talk with your health care provider about whether you are at high risk of being infected with HIV. If you choose to begin PrEP, you should first be tested for HIV. You should then be tested every 3 months for as long as you are taking PrEP.  Use sunscreen. Apply sunscreen liberally and repeatedly throughout the day. You should seek shade when your shadow is shorter than you. Protect yourself by wearing long sleeves, pants, a wide-brimmed hat, and sunglasses year round whenever you are outdoors.  Tell your health care provider of new moles or changes in moles, especially if there is a change in shape or color. Also, tell your health care provider if a mole is larger than the size of a pencil eraser.  A one-time screening for abdominal aortic aneurysm (AAA) and surgical repair of large AAAs by ultrasound is recommended for men aged 67-75 years who are current or former smokers.  Stay current with your vaccines (immunizations).   This information is not intended to replace advice given to you by your health care provider. Make sure you discuss any questions you have with your health care provider.   Document Released: 11/23/2007 Document Revised: 06/17/2014 Document Reviewed: 10/22/2010 Elsevier Interactive Patient Education [employer].

## 2015-11-16 NOTE — Assessment & Plan Note (Signed)
Colonoscopy done this year and clear for 5 years, immunizations are up to date. Counseled on the need for better sun safety and mole surveillance. Given 10 year screening recommendations.

## 2015-11-16 NOTE — Assessment & Plan Note (Signed)
Taking lisinopril and doxazosin and doing well. Reviewed recent CMP with patient at visit and no indication for change.

## 2016-02-14 ENCOUNTER — Other Ambulatory Visit: Payer: Self-pay | Admitting: Internal Medicine

## 2016-04-04 ENCOUNTER — Encounter: Payer: Self-pay | Admitting: Geriatric Medicine

## 2016-05-22 DIAGNOSIS — K6289 Other specified diseases of anus and rectum: Secondary | ICD-10-CM | POA: Diagnosis not present

## 2016-05-22 DIAGNOSIS — K642 Third degree hemorrhoids: Secondary | ICD-10-CM | POA: Diagnosis not present

## 2016-05-22 DIAGNOSIS — L290 Pruritus ani: Secondary | ICD-10-CM | POA: Diagnosis not present

## 2016-06-10 HISTORY — PX: BLEPHAROPLASTY: SUR158

## 2016-08-09 ENCOUNTER — Other Ambulatory Visit: Payer: Self-pay | Admitting: Internal Medicine

## 2016-09-19 DIAGNOSIS — D225 Melanocytic nevi of trunk: Secondary | ICD-10-CM | POA: Diagnosis not present

## 2016-09-19 DIAGNOSIS — L821 Other seborrheic keratosis: Secondary | ICD-10-CM | POA: Diagnosis not present

## 2016-09-19 DIAGNOSIS — D1801 Hemangioma of skin and subcutaneous tissue: Secondary | ICD-10-CM | POA: Diagnosis not present

## 2016-09-19 DIAGNOSIS — D485 Neoplasm of uncertain behavior of skin: Secondary | ICD-10-CM | POA: Diagnosis not present

## 2016-09-19 DIAGNOSIS — Z85828 Personal history of other malignant neoplasm of skin: Secondary | ICD-10-CM | POA: Diagnosis not present

## 2016-11-15 ENCOUNTER — Other Ambulatory Visit: Payer: Self-pay | Admitting: Internal Medicine

## 2016-11-20 ENCOUNTER — Encounter: Payer: Self-pay | Admitting: Nurse Practitioner

## 2016-11-20 ENCOUNTER — Ambulatory Visit (INDEPENDENT_AMBULATORY_CARE_PROVIDER_SITE_OTHER): Payer: Medicare Other | Admitting: Nurse Practitioner

## 2016-11-20 ENCOUNTER — Encounter: Payer: Medicare Other | Admitting: Internal Medicine

## 2016-11-20 ENCOUNTER — Telehealth: Payer: Self-pay | Admitting: Nurse Practitioner

## 2016-11-20 VITALS — BP 118/68 | HR 52 | Temp 97.7°F | Ht 69.0 in | Wt 179.0 lb

## 2016-11-20 DIAGNOSIS — Z125 Encounter for screening for malignant neoplasm of prostate: Secondary | ICD-10-CM

## 2016-11-20 DIAGNOSIS — I10 Essential (primary) hypertension: Secondary | ICD-10-CM

## 2016-11-20 DIAGNOSIS — Z0000 Encounter for general adult medical examination without abnormal findings: Secondary | ICD-10-CM

## 2016-11-20 DIAGNOSIS — Z136 Encounter for screening for cardiovascular disorders: Secondary | ICD-10-CM | POA: Diagnosis not present

## 2016-11-20 DIAGNOSIS — Z13220 Encounter for screening for lipoid disorders: Secondary | ICD-10-CM

## 2016-11-20 NOTE — Patient Instructions (Signed)
Please return to lab fasting for blood draw.   Health Maintenance, Male A healthy lifestyle and preventive care is important for your health and wellness. Ask your health care provider about what schedule of regular examinations is right for you. What should I know about weight and diet? Eat a Healthy Diet  Eat plenty of vegetables, fruits, whole grains, low-fat dairy products, and lean protein.  Do not eat a lot of foods high in solid fats, added sugars, or salt.  Maintain a Healthy Weight Regular exercise can help you achieve or maintain a healthy weight. You should:  Do at least 150 minutes of exercise each week. The exercise should increase your heart rate and make you sweat (moderate-intensity exercise).  Do strength-training exercises at least twice a week.  Watch Your Levels of Cholesterol and Blood Lipids  Have your blood tested for lipids and cholesterol every 5 years starting at 76 years of age. If you are at high risk for heart disease, you should start having your blood tested when you are 76 years old. You may need to have your cholesterol levels checked more often if: ? Your lipid or cholesterol levels are high. ? You are older than 76 years of age. ? You are at high risk for heart disease.  What should I know about cancer screening? Many types of cancers can be detected early and may often be prevented. Lung Cancer  You should be screened every year for lung cancer if: ? You are a current smoker who has smoked for at least 30 years. ? You are a former smoker who has quit within the past 15 years.  Talk to your health care provider about your screening options, when you should start screening, and how often you should be screened.  Colorectal Cancer  Routine colorectal cancer screening usually begins at 76 years of age and should be repeated every 5-10 years until you are 76 years old. You may need to be screened more often if early forms of precancerous polyps or  small growths are found. Your health care provider may recommend screening at an earlier age if you have risk factors for colon cancer.  Your health care provider may recommend using home test kits to check for hidden blood in the stool.  A small camera at the end of a tube can be used to examine your colon (sigmoidoscopy or colonoscopy). This checks for the earliest forms of colorectal cancer.  Prostate and Testicular Cancer  Depending on your age and overall health, your health care provider may do certain tests to screen for prostate and testicular cancer.  Talk to your health care provider about any symptoms or concerns you have about testicular or prostate cancer.  Skin Cancer  Check your skin from head to toe regularly.  Tell your health care provider about any new moles or changes in moles, especially if: ? There is a change in a mole's size, shape, or color. ? You have a mole that is larger than a pencil eraser.  Always use sunscreen. Apply sunscreen liberally and repeat throughout the day.  Protect yourself by wearing long sleeves, pants, a wide-brimmed hat, and sunglasses when outside.  What should I know about heart disease, diabetes, and high blood pressure?  If you are 24-55 years of age, have your blood pressure checked every 3-5 years. If you are 77 years of age or older, have your blood pressure checked every year. You should have your blood pressure measured twice-once when  you are at a hospital or clinic, and once when you are not at a hospital or clinic. Record the average of the two measurements. To check your blood pressure when you are not at a hospital or clinic, you can use: ? An automated blood pressure machine at a pharmacy. ? A home blood pressure monitor.  Talk to your health care provider about your target blood pressure.  If you are between 66-16 years old, ask your health care provider if you should take aspirin to prevent heart disease.  Have regular  diabetes screenings by checking your fasting blood sugar level. ? If you are at a normal weight and have a low risk for diabetes, have this test once every three years after the age of 66. ? If you are overweight and have a high risk for diabetes, consider being tested at a younger age or more often.  A one-time screening for abdominal aortic aneurysm (AAA) by ultrasound is recommended for men aged 5-75 years who are current or former smokers. What should I know about preventing infection? Hepatitis B If you have a higher risk for hepatitis B, you should be screened for this virus. Talk with your health care provider to find out if you are at risk for hepatitis B infection. Hepatitis C Blood testing is recommended for:  Everyone born from [birth year] through 1965.  Anyone with known risk factors for hepatitis C.  Sexually Transmitted Diseases (STDs)  You should be screened each year for STDs including gonorrhea and chlamydia if: ? You are sexually active and are younger than 76 years of age. ? You are older than 76 years of age and your health care provider tells you that you are at risk for this type of infection. ? Your sexual activity has changed since you were last screened and you are at an increased risk for chlamydia or gonorrhea. Ask your health care provider if you are at risk.  Talk with your health care provider about whether you are at high risk of being infected with HIV. Your health care provider may recommend a prescription medicine to help prevent HIV infection.  What else can I do?  Schedule regular health, dental, and eye exams.  Stay current with your vaccines (immunizations).  Do not use any tobacco products, such as cigarettes, chewing tobacco, and e-cigarettes. If you need help quitting, ask your health care provider.  Limit alcohol intake to no more than 2 drinks per day. One drink equals 12 ounces of beer, 5 ounces of wine, or 1 ounces of hard liquor.  Do not use  street drugs.  Do not share needles.  Ask your health care provider for help if you need support or information about quitting drugs.  Tell your health care provider if you often feel depressed.  Tell your health care provider if you have ever been abused or do not feel safe at home. This information is not intended to replace advice given to you by your health care provider. Make sure you discuss any questions you have with your health care provider. Document Released: 11/23/2007 Document Revised: 01/24/2016 Document Reviewed: 02/28/2015 Elsevier Interactive Patient Education  [employer].

## 2016-11-20 NOTE — Telephone Encounter (Signed)
Left vm for pt to call back, we are on back order for this vaccine and other pharmacy is on back order as well. If pt wants to check and see which pharmacy has it, we can send it there or if he wants to put on a wait list when we get this vaccine in we can call him.

## 2016-11-20 NOTE — Progress Notes (Signed)
Subjective:    Patient ID: Nathan Reilly, male    DOB: [birth year], 76 y.o.   MRN: [id number]  Patient presents today for complete physical  HPI  Denies any acute symptoms.  Immunizations: (TDAP, Hep C screen, Pneumovax, Influenza, zoster)  Health Maintenance  Topic Date Due  . Flu Shot  01/08/2017  . Tetanus Vaccine  02/16/2020  . Pneumonia vaccines  Completed   Diet:regular.  Weight:  Wt Readings from Last 3 Encounters:  11/20/16 179 lb (81.2 kg)  11/15/15 179 lb (81.2 kg)  07/04/15 181 lb 12.8 oz (82.5 kg)   Exercise:walking  Fall Risk: Fall Risk  07/04/2015 06/29/2014  Falls in the past year? No No   Home Safety:home with wife.  Depression/Suicide: Depression screen [facility] 2/9 07/04/2015 06/29/2014  Decreased Interest 0 0  Down, Depressed, Hopeless 0 0  PHQ - 2 Score 0 0   No flowsheet data found. Colonoscopy (every 5-79yrs, >50-75yrs):last done 08/2016, benign polyp removed per patient (records requested)  PSA (yearly, >78yrs):needed  Vision:up to date  Dental:uo to date  Advanced Directive: Advanced Directives 07/26/2014  Does Patient Have a Medical Advance Directive? Yes  Type of Paramedic of [place];Living will  Does patient want to make changes to medical advance directive? No - Patient declined  Copy of Anon Raices in Chart? Yes   Sexual History (birth control, marital status, STD):married, sexually active  Medications and allergies reviewed with patient and updated if appropriate.  Patient Active Problem List   Diagnosis Date Noted  . Lightheadedness 07/04/2015  . Overweight (BMI 25.0-29.9) 07/27/2014  . Routine health maintenance 04/12/2011  . Essential hypertension 07/17/2007    Current Outpatient Prescriptions on File Prior to Visit  Medication Sig Dispense Refill  . doxazosin (CARDURA) 4 MG tablet Take 1 tablet (4 mg total) by mouth daily. 90 tablet 3  . Emollient (CETAPHIL) cream Apply 1  application topically daily. Reported on 07/04/2015    . fluticasone (FLONASE) 50 MCG/ACT nasal spray Place 2 sprays into both nostrils daily. 16 g 12  . lisinopril (PRINIVIL,ZESTRIL) 10 MG tablet TAKE 1 TABLET BY MOUTH EVERY DAY 90 tablet 0  . Polyethyl Glycol-Propyl Glycol 0.4-0.3 % GEL Apply 1 drop to eye daily.    . sildenafil (REVATIO) 20 MG tablet Take 1-3 tablets (20-60 mg total) by mouth 3 (three) times daily as needed. 100 tablet 6  . doxazosin (CARDURA) 4 MG tablet TAKE 1 TABLET (4 MG TOTAL) BY MOUTH DAILY. (Patient not taking: Reported on 11/20/2016) 90 tablet 1  . sildenafil (VIAGRA) 100 MG tablet TAKE 0.5-1 TABLETS (50-100 MG TOTAL) BY MOUTH DAILY AS NEEDED FOR ERECTILE DYSFUNCTION. (Patient not taking: Reported on 11/20/2016) 6 tablet 2   No current facility-administered medications on file prior to visit.     Past Medical History:  Diagnosis Date  . Arthritis   . Cancer (HCC)    HX SKIN CANCER  . Enlarged prostate   . GERD (gastroesophageal reflux disease)    OCCASIONAL  . Hay fever   . History of skin cancer   . Hypertension   . Scarlet fever    IN CHILDHOOD  . Temporomandibular joint disorders, unspecified     Past Surgical History:  Procedure Laterality Date  . BAND HEMORRHOIDECTOMY  2015   Dr Earlean Shawl  . POLYPECTOMY  2014   Dr Earlean Shawl  . TONSILLECTOMY    . 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 Marital status: Married    Spouse name: N/A  . Number of children: 2  . Years of education: 16   Occupational History  . sales     retired   Social History Main Topics  . Smoking status: Never Smoker  . Smokeless tobacco: Never Used  . Alcohol use 3.6 oz/week    6 Standard drinks or equivalent per week     Comment: OCCASIONAL  . Drug use: No  . Sexual activity: Yes    Partners: Female    Other Topics Concern  . 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 Married '68, Marriage in good health. 2 sons- '72, '77; 3 grand-daughters.No complaints regarding sexual health. Retired - Orthoptist. ACP - does not want heroic or futile measures in the face of loss of function or quality of life.           Family History  Problem Relation Age of Onset  . Diabetes Father   . Breast cancer Sister   . Hypertension Neg Hx   . Heart disease Neg Hx   . Stroke Neg Hx         Review of Systems  Constitutional: Negative for fever, malaise/fatigue and weight loss.  HENT: Negative for congestion and sore throat.   Eyes:       Negative for visual changes  Respiratory: Negative for cough and shortness of breath.   Cardiovascular: Negative for chest pain, palpitations and leg swelling.  Gastrointestinal: Negative for blood in stool, constipation, diarrhea and heartburn.  Genitourinary: Negative for dysuria, frequency and urgency.  Musculoskeletal: Negative for falls, joint pain and myalgias.  Skin: Negative for rash.  Neurological: Negative for dizziness, sensory change and headaches.  Endo/Heme/Allergies: Does not bruise/bleed easily.  Psychiatric/Behavioral: Negative for depression, substance abuse and suicidal ideas. The patient is not nervous/anxious.     Objective:   Vitals:   11/20/16 0922  BP: 118/68  Pulse: (!) 52  Temp: 97.7 F (36.5 C)    Body mass index is 26.43 kg/m.   Physical Examination:  Physical Exam  Constitutional: He is oriented to person, place, and time and well-developed, well-nourished, and in no distress. No distress.  HENT:  Right Ear: External ear normal.  Left Ear: External ear normal.  Nose: Nose normal.  Mouth/Throat: Oropharynx is clear and moist. No oropharyngeal exudate.  Eyes: Conjunctivae and EOM are  normal. Pupils are equal, round, and reactive to light. No scleral icterus.  Neck: Normal range of motion. Neck supple. No thyromegaly present.  Cardiovascular: Normal rate, normal heart sounds and intact distal pulses.   Pulmonary/Chest: Effort normal and breath sounds normal. He exhibits no tenderness.  Abdominal: Soft. Bowel sounds are normal. He exhibits no distension. There is no tenderness.  Musculoskeletal: Normal range of motion. He exhibits no edema or tenderness.  Lymphadenopathy:    He has no cervical adenopathy.  Neurological: He is alert and oriented to person, place, and time. Gait normal.  Skin: Skin is warm and dry.  Psychiatric: Affect and judgment normal.    ASSESSMENT and PLAN:  Roshan was seen today for annual exam.  Diagnoses and all orders for this visit:  Encounter for preventative adult health care examination -  Comprehensive metabolic panel; Future -     PSA; Future -     TSH; Future -     Lipid panel; Future  Essential hypertension  Prostate cancer screening -     PSA; Future  Encounter for lipid screening for cardiovascular disease -     Lipid panel; Future   No problem-specific Assessment & Plan notes found for this encounter.     Follow up: Return if symptoms worsen or fail to improve.  Wilfred Lacy, NP

## 2016-11-20 NOTE — Telephone Encounter (Signed)
Pt would like to receive the Shingrix vaccine. Is this ok for him to do?  He does not want to verify with insurance, he will pay whatever the cost is.

## 2016-11-20 NOTE — Telephone Encounter (Signed)
ok 

## 2016-11-21 ENCOUNTER — Other Ambulatory Visit (INDEPENDENT_AMBULATORY_CARE_PROVIDER_SITE_OTHER): Payer: Medicare Other

## 2016-11-21 DIAGNOSIS — Z125 Encounter for screening for malignant neoplasm of prostate: Secondary | ICD-10-CM | POA: Diagnosis not present

## 2016-11-21 DIAGNOSIS — Z0000 Encounter for general adult medical examination without abnormal findings: Secondary | ICD-10-CM | POA: Diagnosis not present

## 2016-11-21 DIAGNOSIS — Z136 Encounter for screening for cardiovascular disorders: Secondary | ICD-10-CM

## 2016-11-21 DIAGNOSIS — Z13220 Encounter for screening for lipoid disorders: Secondary | ICD-10-CM | POA: Diagnosis not present

## 2016-11-21 DIAGNOSIS — R7989 Other specified abnormal findings of blood chemistry: Secondary | ICD-10-CM

## 2016-11-21 LAB — COMPREHENSIVE METABOLIC PANEL
ALBUMIN: 4.3 g/dL (ref 3.5–5.2)
ALK PHOS: 71 U/L (ref 39–117)
ALT: 22 U/L (ref 0–53)
AST: 25 U/L (ref 0–37)
BILIRUBIN TOTAL: 0.5 mg/dL (ref 0.2–1.2)
BUN: 21 mg/dL (ref 6–23)
CALCIUM: 9.3 mg/dL (ref 8.4–10.5)
CO2: 26 mEq/L (ref 19–32)
Chloride: 105 mEq/L (ref 96–112)
Creatinine, Ser: 1.26 mg/dL (ref 0.40–1.50)
GFR: 59.1 mL/min — AB (ref >60.00)
Glucose, Bld: 111 mg/dL — ABNORMAL HIGH (ref 70–99)
Potassium: 4.8 mEq/L (ref 3.5–5.1)
Sodium: 138 mEq/L (ref 135–145)
Total Protein: 6.9 g/dL (ref 6.0–8.3)

## 2016-11-21 LAB — LIPID PANEL
CHOLESTEROL: 184 mg/dL (ref 0–200)
HDL: 63.6 mg/dL (ref >39.00)
LDL Cholesterol: 108 mg/dL — ABNORMAL HIGH (ref 0–99)
NONHDL: 120.57
Total CHOL/HDL Ratio: 3
Triglycerides: 63 mg/dL (ref 0.0–149.0)
VLDL: 12.6 mg/dL (ref 0.0–40.0)

## 2016-11-21 LAB — PSA: PSA: 2.66 ng/mL (ref 0.10–4.00)

## 2016-11-21 LAB — TSH: TSH: 7.7 u[IU]/mL — AB (ref 0.35–4.50)

## 2016-11-21 NOTE — Telephone Encounter (Signed)
Pt came by office this morning to speak to [facility] regarding the msg she left him yesterday. I read the chart note to him with the options stated for getting the Shingrix vaccination and pt stated he would like to be added to the waitlist in our office to get the vaccine here once we get it.

## 2016-11-21 NOTE — Telephone Encounter (Signed)
Patient is aware that having medicare as primary insurance, medicare will cover cost of shingrix if injection is given at pharmacy--patient only wants to get vaccine here in our office and is aware he will have out of pocket expense---I have placed patient on shingrix waitlist located in tamara's office

## 2016-11-27 ENCOUNTER — Other Ambulatory Visit (INDEPENDENT_AMBULATORY_CARE_PROVIDER_SITE_OTHER): Payer: Medicare Other

## 2016-11-27 DIAGNOSIS — R946 Abnormal results of thyroid function studies: Secondary | ICD-10-CM

## 2016-11-27 DIAGNOSIS — R7989 Other specified abnormal findings of blood chemistry: Secondary | ICD-10-CM

## 2016-11-27 LAB — TSH: TSH: 8.8 u[IU]/mL — ABNORMAL HIGH (ref 0.35–4.50)

## 2016-11-27 LAB — T3, FREE: T3, Free: 3.3 pg/mL (ref 2.3–4.2)

## 2016-11-27 LAB — T4, FREE: Free T4: 0.67 ng/dL (ref 0.60–1.60)

## 2016-11-28 DIAGNOSIS — H524 Presbyopia: Secondary | ICD-10-CM | POA: Diagnosis not present

## 2016-11-28 DIAGNOSIS — H02022 Mechanical entropion of right lower eyelid: Secondary | ICD-10-CM | POA: Diagnosis not present

## 2016-11-28 DIAGNOSIS — H02025 Mechanical entropion of left lower eyelid: Secondary | ICD-10-CM | POA: Diagnosis not present

## 2016-11-28 DIAGNOSIS — H5203 Hypermetropia, bilateral: Secondary | ICD-10-CM | POA: Diagnosis not present

## 2017-01-08 DIAGNOSIS — H02132 Senile ectropion of right lower eyelid: Secondary | ICD-10-CM | POA: Diagnosis not present

## 2017-01-08 DIAGNOSIS — H02035 Senile entropion of left lower eyelid: Secondary | ICD-10-CM | POA: Diagnosis not present

## 2017-01-15 ENCOUNTER — Telehealth: Payer: Self-pay

## 2017-01-15 NOTE — Telephone Encounter (Signed)
Left message asking patient to call back in if he is still wanting to get shingrix vaccine---I do have a small amount of vaccines that have arrived in the office, however because of patient's insurance/age---he will have to pay for cost of vaccine out of pocket (appx $150)---however, if he goes to his pharmacy, medicare will cover the cost of vaccine---patient originally was told this but still wanted to come to our office to get shingrix---if patient calls back, he needs to schedule nurse visit to get vaccine with understanding that he will have to pay for cost of vaccine out of pocket which will be billed to him

## 2017-02-04 ENCOUNTER — Telehealth: Payer: Self-pay

## 2017-02-04 NOTE — Telephone Encounter (Signed)
Left voicemail advising patient that we still have shingrix vaccine waiting here for him----previous voicemail left on 01/15/17---patient needs to call back and make appt within one week from today, or I will need to continue calling other patients on wait list to come in and get vaccine that is currently waiting for him

## 2017-02-04 NOTE — Telephone Encounter (Signed)
Patient wants to wait until late September, I have placed him back in wait list to check again with him in late September,2018

## 2017-03-04 NOTE — Telephone Encounter (Signed)
I called patient back about shingrix vaccine---I do have some in office available---however, even though patient is still working, it's part time and his [employer] is his primary insurance---I advised that in order for insurance to cover cost of shingrix, he will need to go to his pharmacy to receive vaccine---patient would like to have his name removed from our waitlist---he will go to pharmacy to get vaccine

## 2017-05-14 ENCOUNTER — Encounter: Payer: Self-pay | Admitting: Internal Medicine

## 2017-05-14 ENCOUNTER — Ambulatory Visit: Payer: Medicare Other | Admitting: Internal Medicine

## 2017-05-14 ENCOUNTER — Other Ambulatory Visit (INDEPENDENT_AMBULATORY_CARE_PROVIDER_SITE_OTHER): Payer: Medicare Other

## 2017-05-14 DIAGNOSIS — R7989 Other specified abnormal findings of blood chemistry: Secondary | ICD-10-CM | POA: Diagnosis not present

## 2017-05-14 DIAGNOSIS — E039 Hypothyroidism, unspecified: Secondary | ICD-10-CM | POA: Insufficient documentation

## 2017-05-14 LAB — TSH: TSH: 10.05 u[IU]/mL — AB (ref 0.35–4.50)

## 2017-05-14 LAB — T4, FREE: Free T4: 0.73 ng/dL (ref 0.60–1.60)

## 2017-05-14 LAB — PSA: PSA: 2.22 ng/mL (ref 0.10–4.00)

## 2017-05-14 MED ORDER — ZOSTER VAC RECOMB ADJUVANTED 50 MCG/0.5ML IM SUSR: 0.5000 mL | Freq: Once | INTRAMUSCULAR | 1 refills | Status: AC

## 2017-05-14 NOTE — Patient Instructions (Addendum)
We'll check the labs today and if the coughing goes on let us know in 1-2 weeks and we can get an x-ray.  [facility] has the shingles vaccine.

## 2017-05-14 NOTE — Assessment & Plan Note (Signed)
Checking TSH and free T4 today. No symptoms of thryoid problems and would not initiate treatment at this time.

## 2017-05-14 NOTE — Progress Notes (Signed)
   Subjective:    Patient ID: Nathan Reilly, male    DOB: 04/02/1941, 76 y.o.   MRN: [id number]  HPI The patient is a 76 YO man coming in for follow up of high TSH. He denies heat or cold intolerance. Denies constipation or diarrhea. Has not had symptoms and T4 normal. Denies change from the last 6 months. Still exercising without problems. No weight change.   Review of Systems  Constitutional: Negative.   HENT: Negative.   Eyes: Negative.   Respiratory: Negative for cough, chest tightness and shortness of breath.   Cardiovascular: Negative for chest pain, palpitations and leg swelling.  Gastrointestinal: Negative for abdominal distention, abdominal pain, constipation, diarrhea, nausea and vomiting.  Musculoskeletal: Negative.   Skin: Negative.   Neurological: Negative.   Psychiatric/Behavioral: Negative.       Objective:   Physical Exam  Constitutional: He is oriented to person, place, and time. He appears well-developed and well-nourished.  HENT:  Head: Normocephalic and atraumatic.  Eyes: EOM are normal.  Neck: Normal range of motion.  Cardiovascular: Normal rate and regular rhythm.  Pulmonary/Chest: Effort normal and breath sounds normal. No respiratory distress. He has no wheezes. He has no rales.  Abdominal: Soft. Bowel sounds are normal. He exhibits no distension. There is no tenderness. There is no rebound.  Musculoskeletal: He exhibits no edema.  Neurological: He is alert and oriented to person, place, and time. Coordination normal.  Skin: Skin is warm and dry.  Psychiatric: He has a normal mood and affect.   Vitals:   05/14/17 0803  BP: 126/84  Pulse: (!) 50  Temp: 97.7 F (36.5 C)  TempSrc: Oral  SpO2: 99%  Weight: 182 lb (82.6 kg)  Height: 5\' 9"  (1.753 m)      Assessment & Plan:

## 2017-06-12 ENCOUNTER — Other Ambulatory Visit: Payer: Self-pay | Admitting: Internal Medicine

## 2017-07-09 ENCOUNTER — Telehealth: Payer: Self-pay | Admitting: Internal Medicine

## 2017-07-09 MED ORDER — FLUTICASONE PROPIONATE 50 MCG/ACT NA SUSP: 2.0000 | Freq: Every day | NASAL | 4 refills | Status: DC

## 2017-07-09 NOTE — Telephone Encounter (Signed)
Reviewed chart pt is up-to-date sent refills to [facility]  

## 2017-07-09 NOTE — Telephone Encounter (Signed)
Pt needs a refill of his fluticasone (FLONASE) 50 MCG/ACT nasal spray  Please advise  Please send to [facility] on [address]

## 2017-07-12 IMAGING — CR DG ABDOMEN 2V
2 series · 2 of 2 positions shown · non-contrast
Comparison: Lumbar MRI 04/17/2006.

CLINICAL DATA: 75-year-old male with fever and chills after
colonoscopy today. Initial encounter.

EXAM:
ABDOMEN - 2 VIEW

[t abdomen supine]
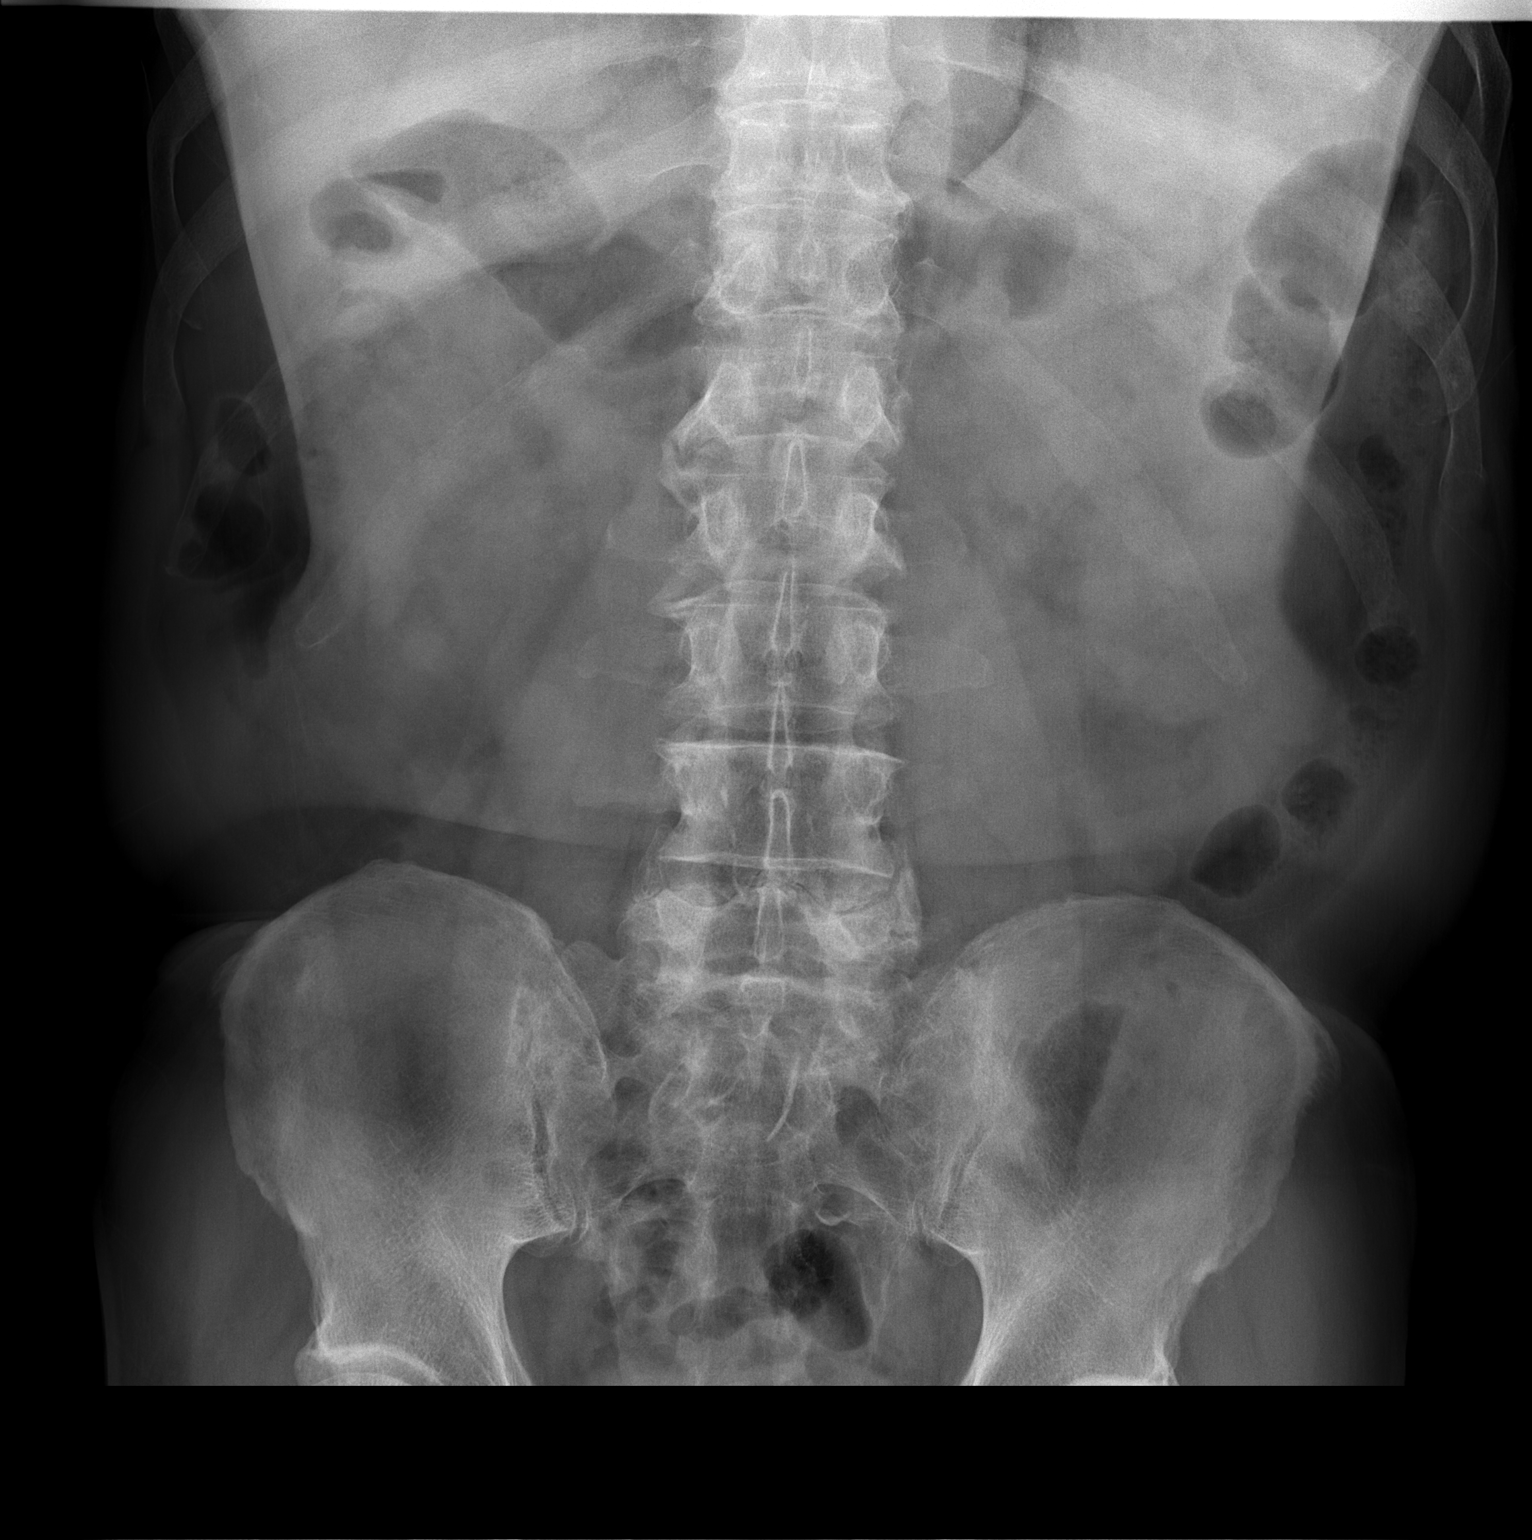

[w abdomen upright]
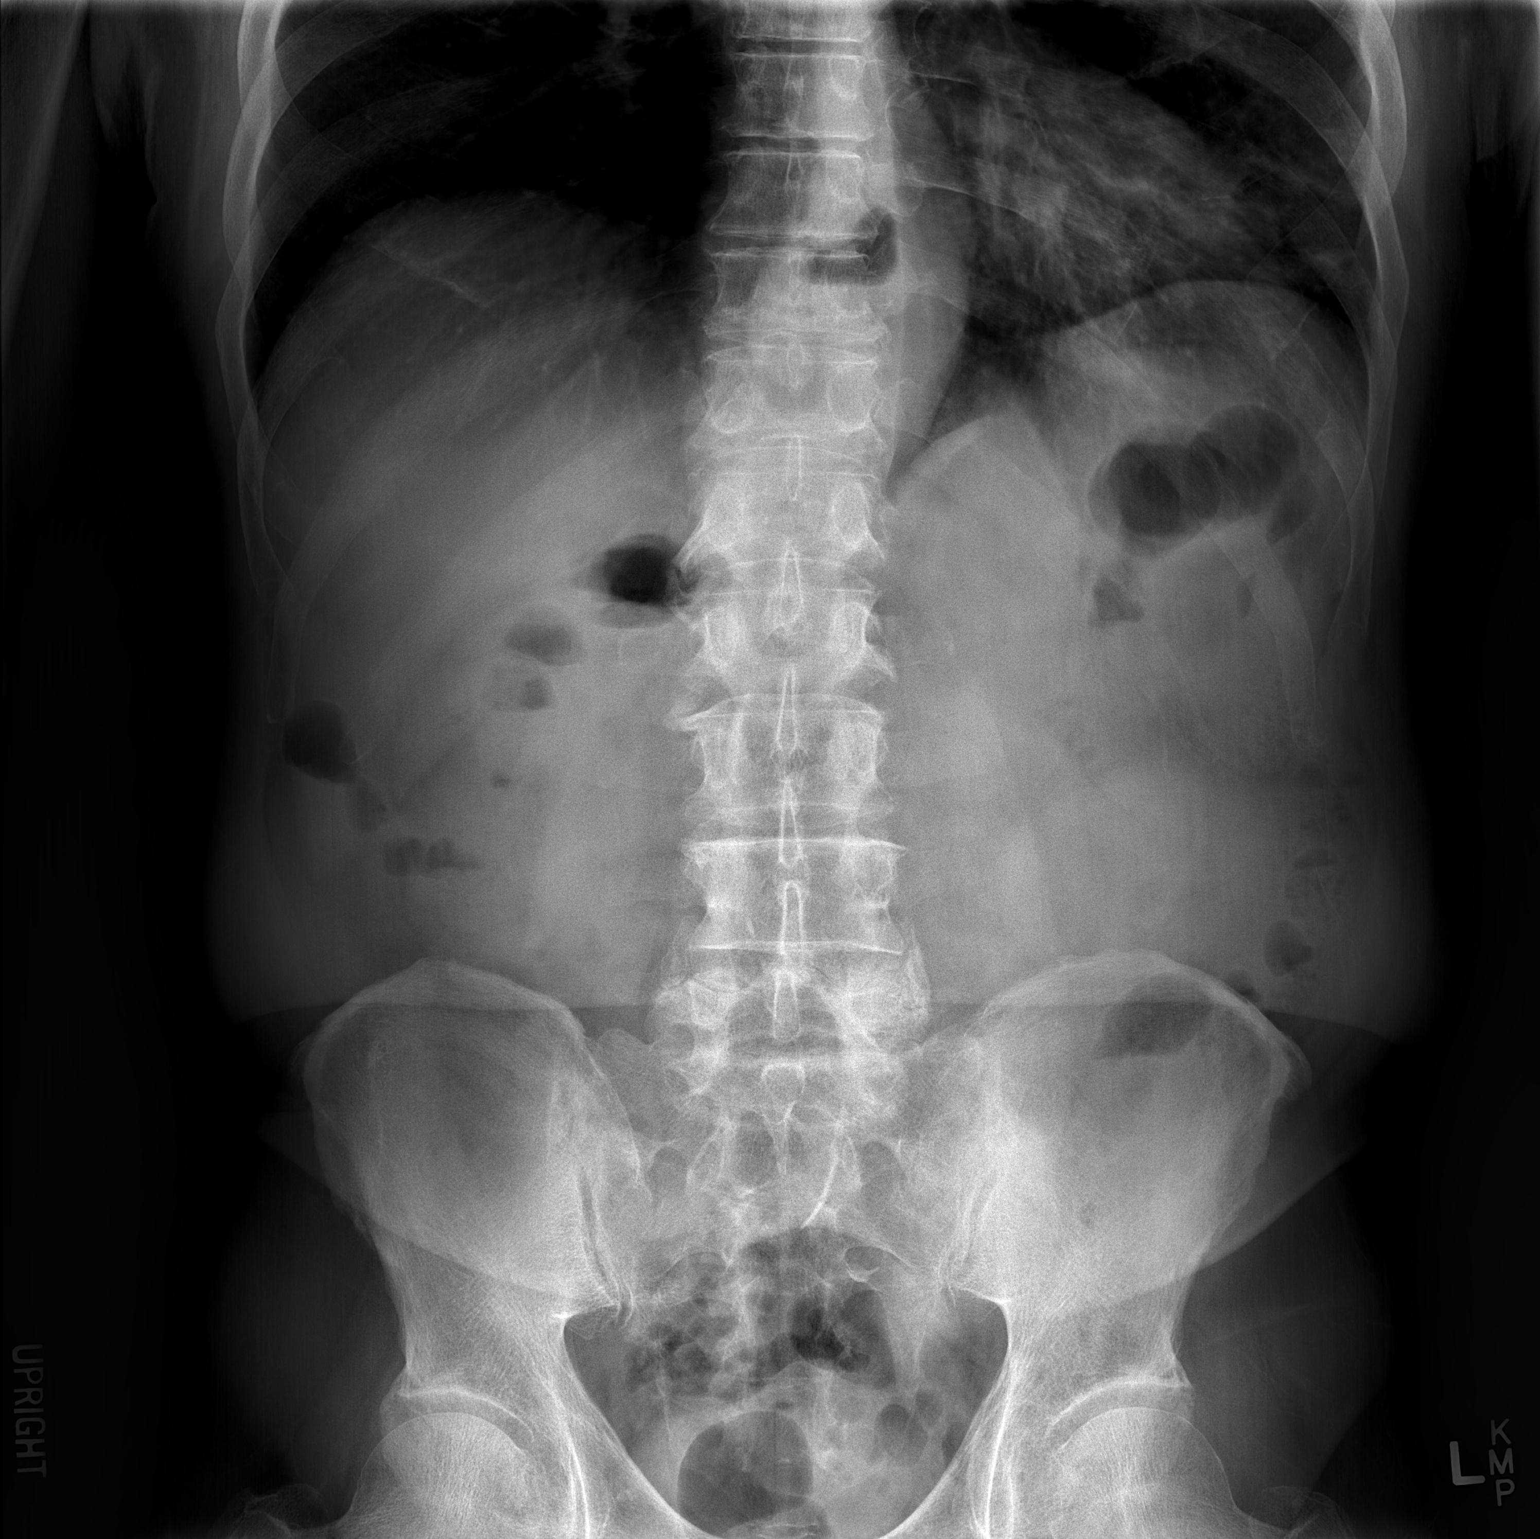

[2 of 2 positions shown; findings below may reference images not displayed]

FINDINGS: Upright and supine views of the abdomen. No pneumoperitoneum. Patchy
increased peribronchial and lung base opacity on the left. Non
obstructed bowel gas pattern. Suspected small gastric hiatal hernia,
with air-fluid level projecting over the diaphragm in the midline.
Skin fold artifact on the supine view. Abdominal and pelvic visceral
contours are within normal limits. No acute osseous abnormality
identified.
IMPRESSION: 1. Abnormal left lung base opacity, suspicious for aspiration and
pneumonia in this setting.
2.  Normal bowel gas pattern, no free air.
3. Gastric hiatal hernia suspected.
These results will be called to the ordering clinician or
representative by the [HOSPITAL] at the imaging location.

## 2017-07-14 DIAGNOSIS — H5789 Other specified disorders of eye and adnexa: Secondary | ICD-10-CM | POA: Diagnosis not present

## 2017-07-14 DIAGNOSIS — H02032 Senile entropion of right lower eyelid: Secondary | ICD-10-CM | POA: Diagnosis not present

## 2017-07-14 DIAGNOSIS — H02035 Senile entropion of left lower eyelid: Secondary | ICD-10-CM | POA: Diagnosis not present

## 2017-07-31 ENCOUNTER — Ambulatory Visit: Payer: Self-pay

## 2017-07-31 NOTE — Telephone Encounter (Signed)
Patient called in with c/o "dizziness." He says "I think it may be vertigo. It started 4 days ago. I feel swimmy headed, weak, unsteady on my feet to the point I have to hold on to something to walk. I feel like my BP may be up because I have this dull pressure, fullness feeling behind my eyes." I asked is the room tilting or spinning when he's up or sitting down, he says "it spins some sitting down, but calms down. When I stand up is when I'm dizzy the most, when I turn my head from side to side the room spins a little, but calms down." I asked is he having sinus congestion, headache, earache, he says "it kind of feels like there is something going on with one of my ears. I don't have a headache or congestion." According to protocol, see PCP within 24 hours, appointment made for tomorrow with Jodi Mourning at 8 am, care advice given, patient verbalized understanding.   Reason for Disposition . [1] MODERATE dizziness (e.g., vertigo; feels very unsteady, interferes with normal activities) AND [2] has NOT been evaluated by physician for this  Answer Assessment - Initial Assessment Questions 1. DESCRIPTION: "Describe your dizziness."     Swimmy headed 2. VERTIGO: "Do you feel like either you or the room is spinning or tilting?"      Yes at certain points, then it calms down 3. LIGHTHEADED: "Do you feel lightheaded?" (e.g., somewhat faint, woozy, weak upon standing)     Weak, swimmy headed 4. SEVERITY: "How bad is it?"  "Can you walk?"   - MILD - Feels unsteady but walking normally.   - MODERATE - Feels very unsteady when walking, but not falling; interferes with normal activities (e.g., school, work) .   - SEVERE - Unable to walk without falling (requires assistance).     Moderate 5. ONSET:  "When did the dizziness begin?"     4 days ago 6. AGGRAVATING FACTORS: "Does anything make it worse?" (e.g., standing, change in head position)     Standing quickly, turn head 7. CAUSE: "What do you think is  causing the dizziness?"     It may be my BP high 8. RECURRENT SYMPTOM: "Have you had dizziness before?" If so, ask: "When was the last time?" "What happened that time?"     Yes-last January, 2018 9. OTHER SYMPTOMS: "Do you have any other symptoms?" (e.g., headache, weakness, numbness, vomiting, earache)     Fullness in head, no headache-dull in background feeling, feels like something going on with one of my ears 10. PREGNANCY: "Is there any chance you are pregnant?" "When was your last menstrual period?"       N/A  Protocols used: DIZZINESS - VERTIGO-A-AH

## 2017-08-01 ENCOUNTER — Encounter: Payer: Self-pay | Admitting: Family

## 2017-08-01 ENCOUNTER — Ambulatory Visit: Payer: Medicare Other | Admitting: Family

## 2017-08-01 ENCOUNTER — Other Ambulatory Visit (INDEPENDENT_AMBULATORY_CARE_PROVIDER_SITE_OTHER): Payer: Medicare Other

## 2017-08-01 VITALS — BP 118/80 | HR 60 | Temp 97.9°F | Ht 69.0 in | Wt 182.1 lb

## 2017-08-01 DIAGNOSIS — R42 Dizziness and giddiness: Secondary | ICD-10-CM | POA: Diagnosis not present

## 2017-08-01 DIAGNOSIS — R899 Unspecified abnormal finding in specimens from other organs, systems and tissues: Secondary | ICD-10-CM | POA: Diagnosis not present

## 2017-08-01 DIAGNOSIS — R7989 Other specified abnormal findings of blood chemistry: Secondary | ICD-10-CM

## 2017-08-01 DIAGNOSIS — E039 Hypothyroidism, unspecified: Secondary | ICD-10-CM | POA: Diagnosis not present

## 2017-08-01 LAB — CBC WITH DIFFERENTIAL/PLATELET
BASOS PCT: 0.5 % (ref 0.0–3.0)
Basophils Absolute: 0 10*3/uL (ref 0.0–0.1)
EOS PCT: 2.4 % (ref 0.0–5.0)
Eosinophils Absolute: 0.2 10*3/uL (ref 0.0–0.7)
HEMATOCRIT: 46.5 % (ref 39.0–52.0)
HEMOGLOBIN: 15.8 g/dL (ref 13.0–17.0)
LYMPHS PCT: 28.3 % (ref 12.0–46.0)
Lymphs Abs: 2.5 10*3/uL (ref 0.7–4.0)
MCHC: 33.9 g/dL (ref 30.0–36.0)
MCV: 92.8 fl (ref 78.0–100.0)
Monocytes Absolute: 0.7 10*3/uL (ref 0.1–1.0)
Monocytes Relative: 8.2 % (ref 3.0–12.0)
Neutro Abs: 5.4 10*3/uL (ref 1.4–7.7)
Neutrophils Relative %: 60.6 % (ref 43.0–77.0)
Platelets: 185 10*3/uL (ref 150.0–400.0)
RBC: 5.01 Mil/uL (ref 4.22–5.81)
RDW: 13.7 % (ref 11.5–15.5)
WBC: 8.9 10*3/uL (ref 4.0–10.5)

## 2017-08-01 LAB — COMPREHENSIVE METABOLIC PANEL
ALBUMIN: 4.2 g/dL (ref 3.5–5.2)
ALT: 19 U/L (ref 0–53)
AST: 19 U/L (ref 0–37)
Alkaline Phosphatase: 73 U/L (ref 39–117)
BUN: 18 mg/dL (ref 6–23)
CALCIUM: 9.6 mg/dL (ref 8.4–10.5)
CHLORIDE: 103 meq/L (ref 96–112)
CO2: 24 meq/L (ref 19–32)
Creatinine, Ser: 1.21 mg/dL (ref 0.40–1.50)
GFR: 61.81 mL/min (ref >60.00)
Glucose, Bld: 97 mg/dL (ref 70–99)
Potassium: 4.3 mEq/L (ref 3.5–5.1)
SODIUM: 138 meq/L (ref 135–145)
Total Bilirubin: 0.7 mg/dL (ref 0.2–1.2)
Total Protein: 7.1 g/dL (ref 6.0–8.3)

## 2017-08-01 LAB — TSH: TSH: 12.5 u[IU]/mL — ABNORMAL HIGH (ref 0.35–4.50)

## 2017-08-01 LAB — PSA: PSA: 2.16 ng/mL (ref 0.10–4.00)

## 2017-08-01 MED ORDER — MECLIZINE HCL 12.5 MG PO TABS: 12.5000 mg | ORAL_TABLET | Freq: Three times a day (TID) | ORAL | 0 refills | Status: DC | PRN

## 2017-08-01 NOTE — Progress Notes (Signed)
Nathan Reilly is a 77 y.o. male with the following history as recorded in Epic[employer]:  Patient Active Problem List   Diagnosis Date Noted  . Elevated TSH 05/14/2017  . Lightheadedness 07/04/2015  . Overweight (BMI 25.0-29.9) 07/27/2014  . Routine health maintenance 04/12/2011  . Essential hypertension 07/17/2007    Current Outpatient Medications  Medication Sig Dispense Refill  . doxazosin (CARDURA) 4 MG tablet Take 1 tablet (4 mg total) by mouth daily. 90 tablet 3  . doxazosin (CARDURA) 4 MG tablet TAKE 1 TABLET (4 MG TOTAL) BY MOUTH DAILY. 90 tablet 1  . Emollient (CETAPHIL) cream Apply 1 application topically daily. Reported on 07/04/2015    . fluticasone (FLONASE) 50 MCG/ACT nasal spray Place 2 sprays into both nostrils daily. Annual appt due in June must see provider for future refills 16 g 4  . lisinopril (PRINIVIL,ZESTRIL) 10 MG tablet TAKE 1 TABLET BY MOUTH EVERY DAY 90 tablet 1  . neomycin-polymyxin b-dexamethasone (MAXITROL) 3.5-10000-0.1 OINT APPLY TO BOTH EYES 1 TIME A DAY FOR 1 WEEK  0  . Polyethyl Glycol-Propyl Glycol 0.4-0.3 % GEL Apply 1 drop to eye daily.    . sildenafil (REVATIO) 20 MG tablet Take 1-3 tablets (20-60 mg total) by mouth 3 (three) times daily as needed. 100 tablet 6  . meclizine (ANTIVERT) 12.5 MG tablet Take 1 tablet (12.5 mg total) by mouth 3 (three) times daily as needed for dizziness. 30 tablet 0   No current facility-administered medications for this visit.     Allergies: Patient has no known allergies.  Past Medical History:  Diagnosis Date  . Arthritis   . Cancer (HCC)    HX SKIN CANCER  . Enlarged prostate   . GERD (gastroesophageal reflux disease)    OCCASIONAL  . Hay fever   . History of skin cancer   . Hypertension   . Scarlet fever    IN CHILDHOOD  . Temporomandibular joint disorders, unspecified     Past Surgical History:  Procedure Laterality Date  . BAND HEMORRHOIDECTOMY  2015   Dr Earlean Shawl  . POLYPECTOMY  2014   Dr Earlean Shawl  .  TONSILLECTOMY    . 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 Diabetes Father   . Breast cancer Sister   . Hypertension Neg Hx   . Heart disease Neg Hx   . Stroke Neg Hx     Social History   Tobacco Use  . Smoking status: Never Smoker  . Smokeless tobacco: Never Used  Substance Use Topics  . Alcohol use: Yes    Alcohol/week: 3.6 oz    Types: 6 Standard drinks or equivalent per week    Comment: OCCASIONAL    Subjective:  Started earlier this week with "episode" of vertigo; has history of recurrent vertigo spells; notes first episode occurred in January 2017; states today is feeling much better and more stable; notes that with this particular episode felt some "ringing in my ears" and pressure in my head; does not have current Rx for Antivert; did have some nausea- no vomiting; denies any blurred vision; no fall;   Would also like to get his thyroid level re-checked today; last checked in early December; level is elevated but no medication has been started; 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  Head: Normocephalic and atraumatic  Eyes: Sclera and conjunctiva clear; pupils round and reactive to light; extraocular movements intact  Ears: External normal; canals clear; tympanic membranes congested bilaterally  Oropharynx: Pink, supple. No suspicious lesions  Neck: Supple without thyromegaly, adenopathy  Lungs: Respirations unlabored; clear to  auscultation bilaterally without wheeze, rales, rhonchi  [facility] exam: normal rate and regular rhythm.  Neurologic: Alert and oriented; speech intact; face symmetrical; moves all extremities well; CNII-XII intact without focal deficit  Assessment:  1. Dizziness   2. Elevated TSH   3. Abnormal laboratory test     Plan:  1. Suspect BPPV- symptoms have resolved today; also discussed eustachian tube dysfunction and how this can contribute to dizziness; check CBC, CMP today; recommend that he use his Flonase regularly; Rx for Antivert given to use with next vertigo flare;  2. Check TSH today and thyroid profile; will consider trial of low dose Synthroid based on his preference; follow-up to be determined. 3. Check PSA per patient request.   No Follow-up on file.  Orders Placed This Encounter  Procedures  . CBC w/Diff    Standing Status:   Future    Number of Occurrences:   1    Standing Expiration Date:   08/01/2018  . Comp Met (CMET)    Standing Status:   Future    Number of Occurrences:   1    Standing Expiration Date:   08/01/2018  . TSH    Standing Status:   Future    Number of Occurrences:   1    Standing Expiration Date:   08/01/2018  . Thyroid Profile    Standing Status:   Future    Number of Occurrences:   1    Standing Expiration Date:   08/01/2018  . PSA    Standing Status:   Future    Number of Occurrences:   1    Standing Expiration Date:   08/01/2018    Requested Prescriptions   Signed Prescriptions Disp Refills  . meclizine (ANTIVERT) 12.5 MG tablet 30 tablet 0    Sig: Take 1 tablet (12.5 mg total) by mouth 3 (three) times daily as needed for dizziness.

## 2017-08-01 NOTE — Patient Instructions (Addendum)
Eustachian Tube Dysfunction The eustachian tube connects the middle ear to the back of the nose. It regulates air pressure in the middle ear by allowing air to move between the ear and nose. It also helps to drain fluid from the middle ear space. When the eustachian tube does not function properly, air pressure, fluid, or both can build up in the middle ear. Eustachian tube dysfunction can affect one or both ears. What are the causes? This condition happens when the eustachian tube becomes blocked or cannot open normally. This may result from:  Ear infections.  Colds and other upper respiratory infections.  Allergies.  Irritation, such as from cigarette smoke or acid from the stomach coming up into the esophagus (gastroesophageal reflux).  Sudden changes in air pressure, such as from descending in an airplane.  Abnormal growths in the nose or throat, such as nasal polyps, tumors, or enlarged tissue at the back of the throat (adenoids).  What increases the risk? This condition may be more likely to develop in people who smoke and people who are overweight. Eustachian tube dysfunction may also be more likely to develop in children, especially children who have:  Certain birth defects of the mouth, such as cleft palate.  Large tonsils and adenoids.  What are the signs or symptoms? Symptoms of this condition may include:  A feeling of fullness in the ear.  Ear pain.  Clicking or popping noises in the ear.  Ringing in the ear.  Hearing loss.  Loss of balance.  Symptoms may get worse when the air pressure around you changes, such as when you travel to an area of high elevation or fly on an airplane. How is this diagnosed? This condition may be diagnosed based on:  Your symptoms.  A physical exam of your ear, nose, and throat.  Tests, such as those that measure: ? The movement of your eardrum (tympanogram). ? Your hearing (audiometry).  How is this treated? Treatment  depends on the cause and severity of your condition. If your symptoms are mild, you may be able to relieve your symptoms by moving air into ("popping") your ears. If you have symptoms of fluid in your ears, treatment may include:  Decongestants.  Antihistamines.  Nasal sprays or ear drops that contain medicines that reduce swelling (steroids).  In some cases, you may need to have a procedure to drain the fluid in your eardrum (myringotomy). In this procedure, a small tube is placed in the eardrum to:  Drain the fluid.  Restore the air in the middle ear space.  Follow these instructions at home:  Take over-the-counter and prescription medicines only as told by your health care provider.  Use techniques to help pop your ears as recommended by your health care provider. These may include: ? Chewing gum. ? Yawning. ? Frequent, forceful swallowing. ? Closing your mouth, holding your nose closed, and gently blowing as if you are trying to blow air out of your nose.  Do not do any of the following until your health care provider approves: ? Travel to high altitudes. ? Fly in airplanes. ? Work in a pressurized cabin or room. ? Scuba dive.  Keep your ears dry. Dry your ears completely after showering or bathing.  Do not smoke.  Keep all follow-up visits as told by your health care provider. This is important. Contact a health care provider if:  Your symptoms do not go away after treatment.  Your symptoms come back after treatment.  You are   unable to pop your ears.  You have: ? A fever. ? Pain in your ear. ? Pain in your head or neck. ? Fluid draining from your ear.  Your hearing suddenly changes.  You become very dizzy.  You lose your balance. This information is not intended to replace advice given to you by your health care provider. Make sure you discuss any questions you have with your health care provider. Document Released: 06/23/2015 Document Revised: 11/02/2015  Document Reviewed: 06/15/2014 Elsevier Interactive Patient Education  2018 [employer]. Benign Positional Vertigo Vertigo is the feeling that you or your surroundings are moving when they are not. Benign positional vertigo is the most common form of vertigo. The cause of this condition is not serious (is benign). This condition is triggered by certain movements and positions (is positional). This condition can be dangerous if it occurs while you are doing something that could endanger you or others, such as driving. What are the causes? In many cases, the cause of this condition is not known. It may be caused by a disturbance in an area of the inner ear that helps your brain to sense movement and balance. This disturbance can be caused by a viral infection (labyrinthitis), head injury, or repetitive motion. What increases the risk? This condition is more likely to develop in:  Women.  People who are 20 years of age or older.  What are the signs or symptoms? Symptoms of this condition usually happen when you move your head or your eyes in different directions. Symptoms may start suddenly, and they usually last for less than a minute. Symptoms may include:  Loss of balance and falling.  Feeling like you are spinning or moving.  Feeling like your surroundings are spinning or moving.  Nausea and vomiting.  Blurred vision.  Dizziness.  Involuntary eye movement (nystagmus).  Symptoms can be mild and cause only slight annoyance, or they can be severe and interfere with daily life. Episodes of benign positional vertigo may return (recur) over time, and they may be triggered by certain movements. Symptoms may improve over time. How is this diagnosed? This condition is usually diagnosed by medical history and a physical exam of the head, neck, and ears. You may be referred to a health care provider who specializes in ear, nose, and throat (ENT) problems (otolaryngologist) or a provider who  specializes in disorders of the nervous system (neurologist). You may have additional testing, including:  MRI.  A CT scan.  Eye movement tests. Your health care provider may ask you to change positions quickly while he or she watches you for symptoms of benign positional vertigo, such as nystagmus. Eye movement may be tested with an electronystagmogram (ENG), caloric stimulation, the Dix-Hallpike test, or the roll test.  An electroencephalogram (EEG). This records electrical activity in your brain.  Hearing tests.  How is this treated? Usually, your health care provider will treat this by moving your head in specific positions to adjust your inner ear back to normal. Surgery may be needed in severe cases, but this is rare. In some cases, benign positional vertigo may resolve on its own in 2-4 weeks. Follow these instructions at home: Safety  Move slowly.Avoid sudden body or head movements.  Avoid driving.  Avoid operating heavy machinery.  Avoid doing any tasks that would be dangerous to you or others if a vertigo episode would occur.  If you have trouble walking or keeping your balance, try using a cane for stability. If you feel  dizzy or unstable, sit down right away.  Return to your normal activities as told by your health care provider. Ask your health care provider what activities are safe for you. General instructions  Take over-the-counter and prescription medicines only as told by your health care provider.  Avoid certain positions or movements as told by your health care provider.  Drink enough fluid to keep your urine clear or pale yellow.  Keep all follow-up visits as told by your health care provider. This is important. Contact a health care provider if:  You have a fever.  Your condition gets worse or you develop new symptoms.  Your family or friends notice any behavioral changes.  Your nausea or vomiting gets worse.  You have numbness or a "pins and  needles" sensation. Get help right away if:  You have difficulty speaking or moving.  You are always dizzy.  You faint.  You develop severe headaches.  You have weakness in your legs or arms.  You have changes in your hearing or vision.  You develop a stiff neck.  You develop sensitivity to light. This information is not intended to replace advice given to you by your health care provider. Make sure you discuss any questions you have with your health care provider. Document Released: 03/04/2006 Document Revised: 11/02/2015 Document Reviewed: 09/19/2014 Elsevier Interactive Patient Education  [employer].

## 2017-08-02 LAB — THYROID PROFILE - CHCC
Free Thyroxine Index: 2.2 (ref 1.4–3.8)
T3 UPTAKE: 33 % (ref 22–35)
T4, Total: 6.6 ug/dL (ref 4.9–10.5)

## 2017-08-04 ENCOUNTER — Other Ambulatory Visit: Payer: Self-pay | Admitting: Family

## 2017-08-04 MED ORDER — LEVOTHYROXINE SODIUM 50 MCG PO TABS: 50.0000 ug | ORAL_TABLET | Freq: Every day | ORAL | 1 refills | Status: DC

## 2017-09-10 ENCOUNTER — Encounter: Payer: Self-pay | Admitting: Internal Medicine

## 2017-09-10 ENCOUNTER — Other Ambulatory Visit (INDEPENDENT_AMBULATORY_CARE_PROVIDER_SITE_OTHER): Payer: Medicare Other

## 2017-09-10 ENCOUNTER — Ambulatory Visit: Payer: Medicare Other | Admitting: Internal Medicine

## 2017-09-10 VITALS — BP 108/80 | HR 49 | Temp 98.4°F | Ht 69.0 in | Wt 178.0 lb

## 2017-09-10 DIAGNOSIS — E039 Hypothyroidism, unspecified: Secondary | ICD-10-CM

## 2017-09-10 DIAGNOSIS — K648 Other hemorrhoids: Secondary | ICD-10-CM | POA: Diagnosis not present

## 2017-09-10 LAB — T4, FREE: Free T4: 0.9 ng/dL (ref 0.60–1.60)

## 2017-09-10 LAB — TSH: TSH: 2.84 u[IU]/mL (ref 0.35–4.50)

## 2017-09-10 NOTE — Progress Notes (Signed)
   Subjective:    Patient ID: Nathan Reilly, male    DOB: [birth year], 77 y.o.   MRN: [id number]  HPI The patient is a 77 YO man coming in for follow up of starting thyroid medication. Started taking synthroid 50 mcg daily. He denies feeling any different lately since starting medicine. Denies heat or cold intolerance. Denies diarrhea or constipation. Denies rash.   Review of Systems  Constitutional: Negative.   Respiratory: Negative for cough, chest tightness and shortness of breath.   Cardiovascular: Negative for chest pain, palpitations and leg swelling.  Gastrointestinal: Negative for abdominal distention, abdominal pain, constipation, diarrhea, nausea and vomiting.  Endocrine: Negative.   Musculoskeletal: Negative.   Skin: Negative.   Neurological: Negative.       Objective:   Physical Exam  Constitutional: He is oriented to person, place, and time. He appears well-developed and well-nourished.  HENT:  Head: Normocephalic and atraumatic.  Eyes: EOM are normal.  Neck: Normal range of motion.  Cardiovascular: Normal rate and regular rhythm.  Pulmonary/Chest: Effort normal and breath sounds normal. No respiratory distress. He has no wheezes. He has no rales.  Abdominal: Soft.  Musculoskeletal: He exhibits no edema.  Neurological: He is alert and oriented to person, place, and time. Coordination normal.  Skin: Skin is warm and dry.   Vitals:   09/10/17 0758  BP: 108/80  Pulse: (!) 49  Temp: 98.4 F (36.9 C)  TempSrc: Oral  SpO2: 98%  Weight: 178 lb (80.7 kg)  Height: 5\' 9"  (1.753 m)      Assessment & Plan:

## 2017-09-10 NOTE — Patient Instructions (Signed)
We will check the labs today and call you back about the results.    

## 2017-09-10 NOTE — Assessment & Plan Note (Signed)
Checking tsh and free T4 and adjust synthroid 50 mcg daily as needed.

## 2017-09-25 DIAGNOSIS — Z85828 Personal history of other malignant neoplasm of skin: Secondary | ICD-10-CM | POA: Diagnosis not present

## 2017-09-25 DIAGNOSIS — C44519 Basal cell carcinoma of skin of other part of trunk: Secondary | ICD-10-CM | POA: Diagnosis not present

## 2017-09-25 DIAGNOSIS — D225 Melanocytic nevi of trunk: Secondary | ICD-10-CM | POA: Diagnosis not present

## 2017-09-25 DIAGNOSIS — L821 Other seborrheic keratosis: Secondary | ICD-10-CM | POA: Diagnosis not present

## 2017-09-25 DIAGNOSIS — D485 Neoplasm of uncertain behavior of skin: Secondary | ICD-10-CM | POA: Diagnosis not present

## 2017-09-25 DIAGNOSIS — D1809 Hemangioma of other sites: Secondary | ICD-10-CM | POA: Diagnosis not present

## 2017-09-25 DIAGNOSIS — L570 Actinic keratosis: Secondary | ICD-10-CM | POA: Diagnosis not present

## 2017-09-27 ENCOUNTER — Other Ambulatory Visit: Payer: Self-pay | Admitting: Family

## 2017-10-02 ENCOUNTER — Encounter: Payer: Self-pay | Admitting: Internal Medicine

## 2017-10-02 ENCOUNTER — Other Ambulatory Visit: Payer: Self-pay

## 2017-10-02 MED ORDER — LEVOTHYROXINE SODIUM 50 MCG PO TABS: 50.0000 ug | ORAL_TABLET | Freq: Every day | ORAL | 1 refills | Status: DC

## 2017-11-07 ENCOUNTER — Telehealth: Payer: Self-pay | Admitting: Emergency Medicine

## 2017-11-07 NOTE — Telephone Encounter (Signed)
Called patient to schedule AWV. Patient declined at this time. 

## 2017-11-24 ENCOUNTER — Other Ambulatory Visit: Payer: Self-pay | Admitting: Internal Medicine

## 2017-11-26 ENCOUNTER — Encounter: Payer: Medicare Other | Admitting: Internal Medicine

## 2017-12-10 ENCOUNTER — Ambulatory Visit (INDEPENDENT_AMBULATORY_CARE_PROVIDER_SITE_OTHER): Payer: Medicare Other | Admitting: Internal Medicine

## 2017-12-10 ENCOUNTER — Other Ambulatory Visit (INDEPENDENT_AMBULATORY_CARE_PROVIDER_SITE_OTHER): Payer: Medicare Other

## 2017-12-10 ENCOUNTER — Encounter: Payer: Self-pay | Admitting: Internal Medicine

## 2017-12-10 VITALS — BP 110/84 | HR 49 | Temp 97.8°F | Ht 69.0 in | Wt 175.0 lb

## 2017-12-10 DIAGNOSIS — I10 Essential (primary) hypertension: Secondary | ICD-10-CM | POA: Diagnosis not present

## 2017-12-10 DIAGNOSIS — Z0000 Encounter for general adult medical examination without abnormal findings: Secondary | ICD-10-CM

## 2017-12-10 DIAGNOSIS — E039 Hypothyroidism, unspecified: Secondary | ICD-10-CM

## 2017-12-10 LAB — LIPID PANEL
Cholesterol: 198 mg/dL (ref 0–200)
HDL: 71.2 mg/dL (ref >39.00)
LDL Cholesterol: 110 mg/dL — ABNORMAL HIGH (ref 0–99)
NonHDL: 126.81
TRIGLYCERIDES: 86 mg/dL (ref 0.0–149.0)
Total CHOL/HDL Ratio: 3
VLDL: 17.2 mg/dL (ref 0.0–40.0)

## 2017-12-10 LAB — COMPREHENSIVE METABOLIC PANEL
ALBUMIN: 4.4 g/dL (ref 3.5–5.2)
ALK PHOS: 76 U/L (ref 39–117)
ALT: 23 U/L (ref 0–53)
AST: 26 U/L (ref 0–37)
BILIRUBIN TOTAL: 0.7 mg/dL (ref 0.2–1.2)
BUN: 19 mg/dL (ref 6–23)
CO2: 26 mEq/L (ref 19–32)
CREATININE: 1.18 mg/dL (ref 0.40–1.50)
Calcium: 9.4 mg/dL (ref 8.4–10.5)
Chloride: 104 mEq/L (ref 96–112)
GFR: 63.57 mL/min (ref >60.00)
Glucose, Bld: 106 mg/dL — ABNORMAL HIGH (ref 70–99)
Potassium: 4.8 mEq/L (ref 3.5–5.1)
Sodium: 139 mEq/L (ref 135–145)
TOTAL PROTEIN: 7 g/dL (ref 6.0–8.3)

## 2017-12-10 LAB — CBC
HCT: 47.6 % (ref 39.0–52.0)
Hemoglobin: 16.1 g/dL (ref 13.0–17.0)
MCHC: 33.8 g/dL (ref 30.0–36.0)
MCV: 93.9 fl (ref 78.0–100.0)
Platelets: 179 10*3/uL (ref 150.0–400.0)
RBC: 5.07 Mil/uL (ref 4.22–5.81)
RDW: 13.6 % (ref 11.5–15.5)
WBC: 7.3 10*3/uL (ref 4.0–10.5)

## 2017-12-10 LAB — PSA: PSA: 3.29 ng/mL (ref 0.10–4.00)

## 2017-12-10 LAB — T4, FREE: Free T4: 0.94 ng/dL (ref 0.60–1.60)

## 2017-12-10 LAB — TSH: TSH: 3.22 u[IU]/mL (ref 0.35–4.50)

## 2017-12-10 MED ORDER — DOXAZOSIN MESYLATE 4 MG PO TABS: 4.0000 mg | ORAL_TABLET | Freq: Every day | ORAL | 3 refills | Status: DC

## 2017-12-10 MED ORDER — LISINOPRIL 10 MG PO TABS: 10.0000 mg | ORAL_TABLET | Freq: Every day | ORAL | 3 refills | Status: DC

## 2017-12-10 MED ORDER — LEVOTHYROXINE SODIUM 50 MCG PO TABS: 50.0000 ug | ORAL_TABLET | Freq: Every day | ORAL | 3 refills | Status: DC

## 2017-12-10 MED ORDER — FLUTICASONE PROPIONATE 50 MCG/ACT NA SUSP: 2.0000 | Freq: Every day | NASAL | 3 refills | Status: DC

## 2017-12-10 NOTE — Assessment & Plan Note (Signed)
BP at goal, checking CMP and adjust doxazosin and lisinopril as needed.

## 2017-12-10 NOTE — Assessment & Plan Note (Signed)
Checking TSH and free T4 and adjust synthroid 50 mcg daily.

## 2017-12-10 NOTE — Progress Notes (Signed)
   Subjective:    Patient ID: Nathan Reilly, male    DOB: 1941-05-09, 77 y.o.   MRN: [id number]  HPI Here for medicare wellness and physical, no new complaints. Please see A/P for status and treatment of chronic medical problems.   Diet: heart healthy Physical activity: active Depression/mood screen: negative Hearing: intact to whispered voice Visual acuity: grossly normal with lens, performs annual eye exam  ADLs: capable Fall risk: none Home safety: good Cognitive evaluation: intact to orientation, naming, recall and repetition EOL planning: adv directives discussed  I have personally reviewed and have noted 1. The patient's medical and social history - reviewed today no changes 2. Their use of alcohol, tobacco or illicit drugs 3. Their current medications and supplements 4. The patient's functional ability including ADL's, fall risks, home safety risks and hearing or visual impairment. 5. Diet and physical activities 6. Evidence for depression or mood disorders 7. Care team reviewed and updated (available in snapshot)  Review of Systems  Constitutional: Negative.   HENT: Negative.   Eyes: Negative.   Respiratory: Negative for cough, chest tightness and shortness of breath.   Cardiovascular: Negative for chest pain, palpitations and leg swelling.  Gastrointestinal: Negative for abdominal distention, abdominal pain, constipation, diarrhea, nausea and vomiting.  Musculoskeletal: Negative.   Skin: Negative.   Neurological: Negative.   Psychiatric/Behavioral: Negative.       Objective:   Physical Exam  Constitutional: He is oriented to person, place, and time. He appears well-developed and well-nourished.  HENT:  Head: Normocephalic and atraumatic.  Eyes: EOM are normal.  Neck: Normal range of motion.  Cardiovascular: Normal rate and regular rhythm.  Pulmonary/Chest: Effort normal and breath sounds normal. No respiratory distress. He has no wheezes. He has no rales.    Abdominal: Soft. Bowel sounds are normal. He exhibits no distension. There is no tenderness. There is no rebound.  Musculoskeletal: He exhibits no edema.  Neurological: He is alert and oriented to person, place, and time. Coordination normal.  Skin: Skin is warm and dry.  Psychiatric: He has a normal mood and affect.   Vitals:   12/10/17 0756  BP: 110/84  Pulse: (!) 49  Temp: 97.8 F (36.6 C)  TempSrc: Oral  SpO2: 97%  Weight: 175 lb (79.4 kg)  Height: 5\' 9"  (1.753 m)      Assessment & Plan:

## 2017-12-10 NOTE — Assessment & Plan Note (Signed)
Colonoscopy up to date. Flu shot yearly. Pneumonia completed. Tetanus up to date. Shingrix counseled. Checking PSA due to elevated in the past. Counseled about sun safety and mole surveillance as well as dangers of distracted driving. Given 10 year screening recommendations.

## 2017-12-10 NOTE — Patient Instructions (Signed)

## 2017-12-18 DIAGNOSIS — C44519 Basal cell carcinoma of skin of other part of trunk: Secondary | ICD-10-CM | POA: Diagnosis not present

## 2018-01-22 DIAGNOSIS — H524 Presbyopia: Secondary | ICD-10-CM | POA: Diagnosis not present

## 2018-01-22 DIAGNOSIS — D3131 Benign neoplasm of right choroid: Secondary | ICD-10-CM | POA: Diagnosis not present

## 2018-01-22 DIAGNOSIS — H2513 Age-related nuclear cataract, bilateral: Secondary | ICD-10-CM | POA: Diagnosis not present

## 2018-01-22 DIAGNOSIS — H5203 Hypermetropia, bilateral: Secondary | ICD-10-CM | POA: Diagnosis not present

## 2018-01-22 DIAGNOSIS — H43311 Vitreous membranes and strands, right eye: Secondary | ICD-10-CM | POA: Diagnosis not present

## 2018-01-31 ENCOUNTER — Encounter: Payer: Self-pay | Admitting: Internal Medicine

## 2018-02-05 DIAGNOSIS — M1612 Unilateral primary osteoarthritis, left hip: Secondary | ICD-10-CM | POA: Diagnosis not present

## 2018-04-30 DIAGNOSIS — Z96652 Presence of left artificial knee joint: Secondary | ICD-10-CM | POA: Diagnosis not present

## 2018-04-30 DIAGNOSIS — Z471 Aftercare following joint replacement surgery: Secondary | ICD-10-CM | POA: Diagnosis not present

## 2018-05-04 DIAGNOSIS — M25562 Pain in left knee: Secondary | ICD-10-CM | POA: Diagnosis not present

## 2018-05-08 ENCOUNTER — Other Ambulatory Visit: Payer: Self-pay

## 2018-05-08 NOTE — Patient Outreach (Signed)
[facility]) Care Management  05/08/2018  Nathan Reilly 06-Jul-1940 [id number]   Medication Adherence call to [name] left a message for patient to call back patient is due on Lisinopril 10 mg.[name] is showing past due under [place].   [facility] Management Direct Dial [phone]  Fax [phone] [name]

## 2018-05-12 ENCOUNTER — Other Ambulatory Visit: Payer: Self-pay

## 2018-05-12 NOTE — Patient Outreach (Signed)
[facility]) Care Management  05/12/2018  Nathan Reilly 05-23-1941 [id number]   Medication Adherence call to Nathan Reilly left a message for patient to call back patient is due on Lisinopril 10 mg. [name] is showing past due under [place].    [facility] Management Direct Dial [phone]  Fax [phone] [email or web address]

## 2018-05-13 DIAGNOSIS — M25562 Pain in left knee: Secondary | ICD-10-CM | POA: Diagnosis not present

## 2018-06-17 DIAGNOSIS — L570 Actinic keratosis: Secondary | ICD-10-CM | POA: Diagnosis not present

## 2018-06-17 DIAGNOSIS — D485 Neoplasm of uncertain behavior of skin: Secondary | ICD-10-CM | POA: Diagnosis not present

## 2018-06-17 DIAGNOSIS — C44319 Basal cell carcinoma of skin of other parts of face: Secondary | ICD-10-CM | POA: Diagnosis not present

## 2018-11-25 DIAGNOSIS — K6289 Other specified diseases of anus and rectum: Secondary | ICD-10-CM | POA: Diagnosis not present

## 2018-11-25 DIAGNOSIS — Z800 Family history of malignant neoplasm of digestive organs: Secondary | ICD-10-CM | POA: Diagnosis not present

## 2018-11-25 DIAGNOSIS — K648 Other hemorrhoids: Secondary | ICD-10-CM | POA: Diagnosis not present

## 2018-11-27 ENCOUNTER — Encounter: Payer: Medicare Other | Admitting: Internal Medicine

## 2018-12-16 ENCOUNTER — Encounter: Payer: Medicare Other | Admitting: Internal Medicine

## 2018-12-28 ENCOUNTER — Ambulatory Visit: Payer: Medicare Other | Admitting: Internal Medicine

## 2018-12-30 DIAGNOSIS — L570 Actinic keratosis: Secondary | ICD-10-CM | POA: Diagnosis not present

## 2018-12-30 DIAGNOSIS — Z86018 Personal history of other benign neoplasm: Secondary | ICD-10-CM | POA: Diagnosis not present

## 2018-12-30 DIAGNOSIS — Z85828 Personal history of other malignant neoplasm of skin: Secondary | ICD-10-CM | POA: Diagnosis not present

## 2018-12-30 DIAGNOSIS — C44212 Basal cell carcinoma of skin of right ear and external auricular canal: Secondary | ICD-10-CM | POA: Diagnosis not present

## 2018-12-30 DIAGNOSIS — D485 Neoplasm of uncertain behavior of skin: Secondary | ICD-10-CM | POA: Diagnosis not present

## 2018-12-30 DIAGNOSIS — D225 Melanocytic nevi of trunk: Secondary | ICD-10-CM | POA: Diagnosis not present

## 2019-01-04 ENCOUNTER — Encounter: Payer: Self-pay | Admitting: Internal Medicine

## 2019-01-04 ENCOUNTER — Other Ambulatory Visit (INDEPENDENT_AMBULATORY_CARE_PROVIDER_SITE_OTHER): Payer: Medicare Other

## 2019-01-04 ENCOUNTER — Other Ambulatory Visit: Payer: Self-pay

## 2019-01-04 ENCOUNTER — Ambulatory Visit (INDEPENDENT_AMBULATORY_CARE_PROVIDER_SITE_OTHER): Payer: Medicare Other | Admitting: Internal Medicine

## 2019-01-04 VITALS — BP 140/84 | HR 53 | Temp 98.0°F | Ht 69.0 in | Wt 172.0 lb

## 2019-01-04 DIAGNOSIS — Z0000 Encounter for general adult medical examination without abnormal findings: Secondary | ICD-10-CM

## 2019-01-04 DIAGNOSIS — E039 Hypothyroidism, unspecified: Secondary | ICD-10-CM | POA: Diagnosis not present

## 2019-01-04 DIAGNOSIS — I10 Essential (primary) hypertension: Secondary | ICD-10-CM

## 2019-01-04 LAB — COMPREHENSIVE METABOLIC PANEL
ALT: 19 U/L (ref 0–53)
AST: 22 U/L (ref 0–37)
Albumin: 4.4 g/dL (ref 3.5–5.2)
Alkaline Phosphatase: 74 U/L (ref 39–117)
BUN: 20 mg/dL (ref 6–23)
CO2: 25 mEq/L (ref 19–32)
Calcium: 9.5 mg/dL (ref 8.4–10.5)
Chloride: 104 mEq/L (ref 96–112)
Creatinine, Ser: 1.22 mg/dL (ref 0.40–1.50)
GFR: 57.39 mL/min — ABNORMAL LOW (ref >60.00)
Glucose, Bld: 114 mg/dL — ABNORMAL HIGH (ref 70–99)
Potassium: 4.5 mEq/L (ref 3.5–5.1)
Sodium: 138 mEq/L (ref 135–145)
Total Bilirubin: 0.5 mg/dL (ref 0.2–1.2)
Total Protein: 7.1 g/dL (ref 6.0–8.3)

## 2019-01-04 LAB — CBC
HCT: 45.7 % (ref 39.0–52.0)
Hemoglobin: 15.6 g/dL (ref 13.0–17.0)
MCHC: 34 g/dL (ref 30.0–36.0)
MCV: 93.3 fl (ref 78.0–100.0)
Platelets: 189 10*3/uL (ref 150.0–400.0)
RBC: 4.9 Mil/uL (ref 4.22–5.81)
RDW: 13.9 % (ref 11.5–15.5)
WBC: 7.2 10*3/uL (ref 4.0–10.5)

## 2019-01-04 LAB — LIPID PANEL
Cholesterol: 188 mg/dL (ref 0–200)
HDL: 75.2 mg/dL (ref >39.00)
LDL Cholesterol: 96 mg/dL (ref 0–99)
NonHDL: 112.44
Total CHOL/HDL Ratio: 2
Triglycerides: 81 mg/dL (ref 0.0–149.0)
VLDL: 16.2 mg/dL (ref 0.0–40.0)

## 2019-01-04 LAB — PSA: PSA: 2.53 ng/mL (ref 0.10–4.00)

## 2019-01-04 LAB — TSH: TSH: 3.47 u[IU]/mL (ref 0.35–4.50)

## 2019-01-04 LAB — T4, FREE: Free T4: 0.86 ng/dL (ref 0.60–1.60)

## 2019-01-04 NOTE — Progress Notes (Signed)
Subjective:   Patient ID: Nathan Reilly, male    DOB: August 19, 1940, 78 y.o.   MRN: [id number]  HPI Here for medicare wellness and physical, no new complaints. Please see A/P for status and treatment of chronic medical problems.   Diet: heart healthy Physical activity: sedentary, plays golf Depression/mood screen: negative Hearing: intact to whispered voice Visual acuity: grossly normal with lens, performs annual eye exam  ADLs: capable Fall risk: none Home safety: good Cognitive evaluation: intact to orientation, naming, recall and repetition EOL planning: adv directives discussed    Office Visit from 01/04/2019 in [facility]  PHQ-2 Total Score  0      I have personally reviewed and have noted 1. The patient's medical and social history - reviewed today no changes 2. Their use of alcohol, tobacco or illicit drugs 3. Their current medications and supplements 4. The patient's functional ability including ADL's, fall risks, home safety risks and hearing or visual impairment. 5. Diet and physical activities 6. Evidence for depression or mood disorders 7. Care team reviewed and updated  Patient Care Team: Hoyt Koch, MD as PCP - General (Internal Medicine) Marica Otter, OD (Optometry) Past Medical History:  Diagnosis Date  . Arthritis   . Cancer (HCC)    HX SKIN CANCER  . Enlarged prostate   . GERD (gastroesophageal reflux disease)    OCCASIONAL  . Hay fever   . History of skin cancer   . Hypertension   . Scarlet fever    IN CHILDHOOD  . Temporomandibular joint disorders, unspecified    Past Surgical History:  Procedure Laterality Date  . BAND HEMORRHOIDECTOMY  2015   Dr Earlean Shawl  . POLYPECTOMY  2014   Dr Earlean Shawl  . TONSILLECTOMY    . 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 Diabetes Father   . Breast cancer Sister   . Hypertension Neg Hx   . Heart disease Neg Hx   . Stroke Neg Hx    Review of Systems  Constitutional: Negative.   HENT: Negative.   Eyes: Negative.   Respiratory: Negative for cough, chest tightness and shortness of breath.   Cardiovascular: Negative for chest pain, palpitations and leg swelling.  Gastrointestinal: Negative for abdominal distention, abdominal pain, constipation, diarrhea, nausea and vomiting.  Musculoskeletal: Negative.   Skin: Negative.   Neurological: Negative.   Psychiatric/Behavioral: Negative.     Objective:  Physical Exam Constitutional:      Appearance: He is well-developed.  HENT:     Head: Normocephalic and atraumatic.  Neck:     Musculoskeletal: Normal range of motion.  Cardiovascular:     Rate and Rhythm: Normal rate and regular rhythm.  Pulmonary:     Effort: Pulmonary effort is normal. No respiratory distress.     Breath sounds: Normal breath sounds. No wheezing or rales.  Abdominal:     General: Bowel sounds are normal. There is no distension.     Palpations: Abdomen is soft.     Tenderness: There is no abdominal tenderness. There is no rebound.  Skin:    General: Skin is warm and dry.  Neurological:     Mental Status: He is alert and oriented to person, place, and time.     Coordination: Coordination normal.  Vitals:   01/04/19 0825  BP: 140/84  Pulse: (!) 53  Temp: 98 F (36.7 C)  TempSrc: Oral  SpO2: 97%  Weight: 172 lb (78 kg)  Height: 5\' 9"  (1.753 m)    Assessment & Plan:

## 2019-01-04 NOTE — Assessment & Plan Note (Signed)
Flu shot yearly counseled. Pneumonia completed. Shingrix counseled, had zoster. Tetanus due 2021. Colonoscopy aged out. Counseled about sun safety and mole surveillance. Counseled about the dangers of distracted driving. Given 10 year screening recommendations.

## 2019-01-04 NOTE — Assessment & Plan Note (Signed)
BP at goal on lisinopril and doxazosin. Checking CMP and adjust as needed.  

## 2019-01-04 NOTE — Assessment & Plan Note (Signed)
Checking TSH and free T4 and adjust synthroid 50 mcg daily as needed.  

## 2019-01-04 NOTE — Patient Instructions (Signed)

## 2019-02-01 DIAGNOSIS — C44212 Basal cell carcinoma of skin of right ear and external auricular canal: Secondary | ICD-10-CM | POA: Diagnosis not present

## 2019-02-12 ENCOUNTER — Other Ambulatory Visit: Payer: Self-pay | Admitting: Internal Medicine

## 2019-02-22 ENCOUNTER — Other Ambulatory Visit: Payer: Self-pay | Admitting: Internal Medicine

## 2019-04-27 ENCOUNTER — Encounter: Payer: Self-pay | Admitting: Internal Medicine

## 2019-04-30 DIAGNOSIS — M7652 Patellar tendinitis, left knee: Secondary | ICD-10-CM | POA: Diagnosis not present

## 2019-06-25 ENCOUNTER — Encounter: Payer: Self-pay | Admitting: Internal Medicine

## 2019-07-04 ENCOUNTER — Encounter: Payer: Self-pay | Admitting: Internal Medicine

## 2019-07-09 ENCOUNTER — Ambulatory Visit: Payer: Medicare Other

## 2019-07-15 ENCOUNTER — Ambulatory Visit: Payer: Medicare Other | Attending: Internal Medicine

## 2019-07-15 DIAGNOSIS — Z23 Encounter for immunization: Secondary | ICD-10-CM | POA: Insufficient documentation

## 2019-07-15 NOTE — Progress Notes (Signed)
   Covid-19 Vaccination Clinic  Name:  ADEL ALLEMANG    MRN: [id number] DOB: 13-Sep-1940  07/15/2019  Mr. Mosey was observed post Covid-19 immunization for 15 minutes without incidence. He was provided with Vaccine Information Sheet and instruction to access the V-Safe system.   Mr. Dornbusch was instructed to call 911 with any severe reactions post vaccine: Marland Kitchen Difficulty breathing  . Swelling of your face and throat  . A fast heartbeat  . A bad rash all over your body  . Dizziness and weakness    Immunizations Administered    Name Date Dose VIS Date Route   Pfizer COVID-19 Vaccine 07/15/2019 12:12 PM 0.3 mL 05/21/2019 Intramuscular   Manufacturer: [place]   Lot: [id number]   [place]: [id number]

## 2019-07-30 ENCOUNTER — Ambulatory Visit: Payer: Medicare Other

## 2019-08-09 ENCOUNTER — Ambulatory Visit: Payer: Medicare Other | Attending: Internal Medicine

## 2019-08-09 DIAGNOSIS — Z23 Encounter for immunization: Secondary | ICD-10-CM | POA: Insufficient documentation

## 2019-08-09 NOTE — Progress Notes (Signed)
   Covid-19 Vaccination Clinic  Name:  Nathan Reilly    MRN: [id number] DOB: 1940-12-14  08/09/2019  [name] was observed post Covid-19 immunization for 15 minutes without incidence. He was provided with Vaccine Information Sheet and instruction to access the V-Safe system.   [name] was instructed to call 911 with any severe reactions post vaccine: Marland Kitchen Difficulty breathing  . Swelling of your face and throat  . A fast heartbeat  . A bad rash all over your body  . Dizziness and weakness    Immunizations Administered    Name Date Dose VIS Date Route   Pfizer COVID-19 Vaccine 08/09/2019  3:34 PM 0.3 mL 05/21/2019 Intramuscular   Manufacturer: [place]   Lot: [id number]   [place]: [id number]

## 2019-09-07 ENCOUNTER — Ambulatory Visit: Payer: Medicare Other

## 2019-12-17 ENCOUNTER — Emergency Department (HOSPITAL_COMMUNITY)
Admission: EM | Admit: 2019-12-17 | Discharge: 2019-12-17 | Disposition: A | Discharge status: Home / Self Care | Payer: PRIVATE HEALTH INSURANCE | Attending: Emergency Medicine | Admitting: Emergency Medicine

## 2019-12-17 ENCOUNTER — Emergency Department (HOSPITAL_COMMUNITY): Payer: PRIVATE HEALTH INSURANCE

## 2019-12-17 DIAGNOSIS — Z96652 Presence of left artificial knee joint: Secondary | ICD-10-CM | POA: Insufficient documentation

## 2019-12-17 DIAGNOSIS — Z23 Encounter for immunization: Secondary | ICD-10-CM | POA: Insufficient documentation

## 2019-12-17 DIAGNOSIS — Z85828 Personal history of other malignant neoplasm of skin: Secondary | ICD-10-CM | POA: Diagnosis not present

## 2019-12-17 DIAGNOSIS — M25562 Pain in left knee: Secondary | ICD-10-CM | POA: Insufficient documentation

## 2019-12-17 DIAGNOSIS — E039 Hypothyroidism, unspecified: Secondary | ICD-10-CM | POA: Diagnosis not present

## 2019-12-17 DIAGNOSIS — Y990 Civilian activity done for income or pay: Secondary | ICD-10-CM | POA: Diagnosis not present

## 2019-12-17 DIAGNOSIS — W01198A Fall on same level from slipping, tripping and stumbling with subsequent striking against other object, initial encounter: Secondary | ICD-10-CM | POA: Insufficient documentation

## 2019-12-17 DIAGNOSIS — I10 Essential (primary) hypertension: Secondary | ICD-10-CM | POA: Insufficient documentation

## 2019-12-17 DIAGNOSIS — Y9239 Other specified sports and athletic area as the place of occurrence of the external cause: Secondary | ICD-10-CM | POA: Diagnosis not present

## 2019-12-17 DIAGNOSIS — S01111A Laceration without foreign body of right eyelid and periocular area, initial encounter: Secondary | ICD-10-CM | POA: Insufficient documentation

## 2019-12-17 DIAGNOSIS — Y9389 Activity, other specified: Secondary | ICD-10-CM | POA: Diagnosis not present

## 2019-12-17 DIAGNOSIS — S0181XA Laceration without foreign body of other part of head, initial encounter: Secondary | ICD-10-CM

## 2019-12-17 DIAGNOSIS — Z79899 Other long term (current) drug therapy: Secondary | ICD-10-CM | POA: Insufficient documentation

## 2019-12-17 DIAGNOSIS — W19XXXA Unspecified fall, initial encounter: Secondary | ICD-10-CM

## 2019-12-17 MED ORDER — TETANUS-DIPHTH-ACELL PERTUSSIS 5-2.5-18.5 LF-MCG/0.5 IM SUSP
0.5000 mL | Freq: Once | INTRAMUSCULAR | Status: AC
  Administered 2019-12-17: 0.5 mL via INTRAMUSCULAR
  Filled 2019-12-17: qty 0.5

## 2019-12-17 MED ORDER — LIDOCAINE-EPINEPHRINE (PF) 2 %-1:200000 IJ SOLN
INTRAMUSCULAR | Status: AC
  Filled 2019-12-17: qty 20

## 2019-12-17 MED ORDER — LIDOCAINE-EPINEPHRINE (PF) 2 %-1:200000 IJ SOLN: 10.0000 mL | Freq: Once | INTRAMUSCULAR | Status: DC
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
## 2019-12-17 NOTE — ED Notes (Signed)
Pt with registration, paperwork given, verbalized understanding of discharge instructions and follow-up care.
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
## 2019-12-17 NOTE — ED Provider Notes (Signed)
 ..  Laceration Repair  Date/Time: 12/17/2019 5:30 PM Performed by: Lorayne Bender, PA-C Authorized by: Lacretia Leigh, MD   Consent:    Consent obtained:  Verbal   Consent given by:  Patient   Risks discussed:  Infection, need for additional repair, poor wound healing, poor cosmetic result and pain Anesthesia (see MAR for exact dosages):    Anesthesia method:  Local infiltration   Local anesthetic:  Lidocaine 2% WITH epi Laceration details:    Location:  Face   Face location:  R eyebrow   Length (cm):  3 Repair type:    Repair type:  Complex Pre-procedure details:    Preparation:  Patient was prepped and draped in usual sterile fashion Exploration:    Limited defect created (wound extended): yes     Wound exploration: wound explored through full range of motion and entire depth of wound probed and visualized     Contaminated: no   Treatment:    Area cleansed with:  Betadine and saline   Amount of cleaning:  Extensive   Irrigation solution:  Sterile saline   Irrigation method:  Syringe Skin repair:    Repair method:  Sutures   Suture size:  5-0   Suture material:  Fast-absorbing gut   Suture technique:  Simple interrupted   Number of sutures:  10 Approximation:    Approximation:  Close Post-procedure details:    Dressing:  Open (no dressing)   Patient tolerance of procedure:  Tolerated well, no immediate complications     Layla Maw 12/17/19 1751    Lacretia Leigh, MD 12/18/19 1704

## 2019-12-17 NOTE — ED Provider Notes (Signed)
I saw and evaluated the patient, reviewed the resident's note and I agree with the findings and plan.  EKG:    79 year old male here for mechanical fall and striking the right side of his head.  Has a laceration which is being repaired.  No neurological deficits will be discharged home   Lacretia Leigh, MD 12/17/19 1722

## 2019-12-17 NOTE — ED Notes (Signed)
Pt left before discharge vitals and discharge paperwork.

## 2019-12-20 DIAGNOSIS — Z96652 Presence of left artificial knee joint: Secondary | ICD-10-CM | POA: Diagnosis not present

## 2019-12-20 DIAGNOSIS — M25562 Pain in left knee: Secondary | ICD-10-CM | POA: Diagnosis not present

## 2019-12-27 DIAGNOSIS — H2513 Age-related nuclear cataract, bilateral: Secondary | ICD-10-CM | POA: Diagnosis not present

## 2019-12-27 DIAGNOSIS — H52223 Regular astigmatism, bilateral: Secondary | ICD-10-CM | POA: Diagnosis not present

## 2019-12-27 DIAGNOSIS — H5203 Hypermetropia, bilateral: Secondary | ICD-10-CM | POA: Diagnosis not present

## 2019-12-27 DIAGNOSIS — H524 Presbyopia: Secondary | ICD-10-CM | POA: Diagnosis not present

## 2020-01-06 ENCOUNTER — Ambulatory Visit (INDEPENDENT_AMBULATORY_CARE_PROVIDER_SITE_OTHER): Payer: Medicare Other | Admitting: Internal Medicine

## 2020-01-06 ENCOUNTER — Encounter: Payer: Self-pay | Admitting: Internal Medicine

## 2020-01-06 ENCOUNTER — Other Ambulatory Visit: Payer: Self-pay

## 2020-01-06 VITALS — BP 114/76 | HR 62 | Temp 98.0°F | Ht 69.0 in | Wt 174.0 lb

## 2020-01-06 DIAGNOSIS — I10 Essential (primary) hypertension: Secondary | ICD-10-CM

## 2020-01-06 DIAGNOSIS — E039 Hypothyroidism, unspecified: Secondary | ICD-10-CM | POA: Diagnosis not present

## 2020-01-06 DIAGNOSIS — R350 Frequency of micturition: Secondary | ICD-10-CM | POA: Diagnosis not present

## 2020-01-06 DIAGNOSIS — Z0000 Encounter for general adult medical examination without abnormal findings: Secondary | ICD-10-CM | POA: Diagnosis not present

## 2020-01-06 DIAGNOSIS — N401 Enlarged prostate with lower urinary tract symptoms: Secondary | ICD-10-CM
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
## 2020-01-06 NOTE — Patient Instructions (Addendum)

## 2020-01-06 NOTE — Assessment & Plan Note (Signed)
BP at goal on lisinopril and doxazosin. Checking CMP and adjust as needed.

## 2020-01-06 NOTE — Progress Notes (Signed)
Subjective:   Patient ID: Nathan Reilly, male    DOB: [birth year], 79 y.o.   MRN: [id number]  HPI Here for medicare wellness and physical, no new complaints. Please see A/P for status and treatment of chronic medical problems.   Diet: heart healthy Physical activity: active Depression/mood screen: negative Hearing: intact to whispered voice Visual acuity: grossly normal with lens, performs annual eye exam  ADLs: capable Fall risk: low Home safety: good Cognitive evaluation: intact to orientation, naming, recall and repetition EOL planning: adv directives discussed    Office Visit from 01/06/2020 in [place] at [facility] Total Score 0      I have personally reviewed and have noted 1. The patient's medical and social history - reviewed today no changes 2. Their use of alcohol, tobacco or illicit drugs 3. Their current medications and supplements 4. The patient's functional ability including ADL's, fall risks, home safety risks and hearing or visual impairment. 5. Diet and physical activities 6. Evidence for depression or mood disorders 7. Care team reviewed and updated  Patient Care Team: Hoyt Koch, MD as PCP - General (Internal Medicine) Marica Otter, OD (Optometry) Past Medical History:  Diagnosis Date  . Arthritis   . Cancer (HCC)    HX SKIN CANCER  . Enlarged prostate   . GERD (gastroesophageal reflux disease)    OCCASIONAL  . Hay fever   . History of skin cancer   . Hypertension   . Scarlet fever    IN CHILDHOOD  . Temporomandibular joint disorders, unspecified    Past Surgical History:  Procedure Laterality Date  . BAND HEMORRHOIDECTOMY  2015   Dr Earlean Shawl  . POLYPECTOMY  2014   Dr Earlean Shawl  . TONSILLECTOMY    . 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 Diabetes Father   . Breast cancer Sister   . Hypertension Neg Hx   . Heart disease Neg Hx   . Stroke Neg Hx     Review of Systems  Constitutional: Negative.   HENT: Negative.   Eyes: Negative.   Respiratory: Negative for cough, chest tightness and shortness of breath.   Cardiovascular: Negative for chest pain, palpitations and leg swelling.  Gastrointestinal: Negative for abdominal distention, abdominal pain, constipation, diarrhea, nausea and vomiting.  Musculoskeletal: Negative.   Skin: Negative.   Neurological: Negative.   Psychiatric/Behavioral: Negative.     Objective:  Physical Exam Constitutional:      Appearance: He is well-developed.  HENT:     Head: Normocephalic and atraumatic.  Cardiovascular:     Rate and Rhythm: Normal rate and regular rhythm.  Pulmonary:     Effort: Pulmonary effort is normal. No respiratory distress.     Breath sounds: Normal breath sounds. No wheezing or rales.  Abdominal:     General: Bowel sounds are normal. There is no distension.     Palpations: Abdomen is soft.     Tenderness: There is no abdominal tenderness. There is no rebound.  Musculoskeletal:     Cervical back: Normal range of motion.  Skin:    General: Skin is warm and dry.  Neurological:     Mental Status: He is alert and oriented to person, place, and time.     Coordination: Coordination normal.     Vitals:  01/06/20 0757  BP: 114/76  Pulse: 62  Temp: 98 F (36.7 C)  TempSrc: Oral  SpO2: 97%  Weight: 174 lb (78.9 kg)  Height: 5\' 9"  (1.753 m)   This visit occurred during the SARS-CoV-2 public health emergency.  Safety protocols were in place, including screening questions prior to the visit, additional usage of staff PPE, and extensive cleaning of exam room while observing appropriate contact time as indicated for disinfecting solutions.   Assessment & Plan:

## 2020-01-06 NOTE — Assessment & Plan Note (Signed)
Checking TSH and free T4 and adjust synthroid 50 mcg daily as needed.  

## 2020-01-07 LAB — HEMOGLOBIN A1C
Hgb A1c MFr Bld: 5.6 % of total Hgb (ref <5.7)
Mean Plasma Glucose: 114 (calc)
eAG (mmol/L): 6.3 (calc)

## 2020-01-07 LAB — CBC
HCT: 45.6 % (ref 38.5–50.0)
Hemoglobin: 15.3 g/dL (ref 13.2–17.1)
MCH: 31 pg (ref 27.0–33.0)
MCHC: 33.6 g/dL (ref 32.0–36.0)
MCV: 92.5 fL (ref 80.0–100.0)
MPV: 11.2 fL (ref 7.5–12.5)
Platelets: 178 10*3/uL (ref 140–400)
RBC: 4.93 10*6/uL (ref 4.20–5.80)
RDW: 12.6 % (ref 11.0–15.0)
WBC: 6.5 10*3/uL (ref 3.8–10.8)

## 2020-01-07 LAB — COMPREHENSIVE METABOLIC PANEL
AG Ratio: 1.6 (calc) (ref 1.0–2.5)
ALT: 21 U/L (ref 9–46)
AST: 26 U/L (ref 10–35)
Albumin: 4.2 g/dL (ref 3.6–5.1)
Alkaline phosphatase (APISO): 70 U/L (ref 35–144)
BUN/Creatinine Ratio: 16 (calc) (ref 6–22)
BUN: 21 mg/dL (ref 7–25)
CO2: 23 mmol/L (ref 20–32)
Calcium: 9.2 mg/dL (ref 8.6–10.3)
Chloride: 106 mmol/L (ref 98–110)
Creat: 1.32 mg/dL — ABNORMAL HIGH (ref 0.70–1.18)
Globulin: 2.6 g/dL (calc) (ref 1.9–3.7)
Glucose, Bld: 110 mg/dL — ABNORMAL HIGH (ref 65–99)
Potassium: 4.4 mmol/L (ref 3.5–5.3)
Sodium: 138 mmol/L (ref 135–146)
Total Bilirubin: 0.5 mg/dL (ref 0.2–1.2)
Total Protein: 6.8 g/dL (ref 6.1–8.1)

## 2020-01-07 LAB — TSH: TSH: 4.52 mIU/L — ABNORMAL HIGH (ref 0.40–4.50)

## 2020-01-07 LAB — LIPID PANEL
Cholesterol: 183 mg/dL (ref <200)
HDL: 68 mg/dL (ref >=40)
LDL Cholesterol (Calc): 101 mg/dL (calc) — ABNORMAL HIGH
Non-HDL Cholesterol (Calc): 115 mg/dL (calc) (ref <130)
Total CHOL/HDL Ratio: 2.7 (calc) (ref <5.0)
Triglycerides: 56 mg/dL (ref <150)

## 2020-01-07 LAB — T4, FREE: Free T4: 1.3 ng/dL (ref 0.8–1.8)

## 2020-01-07 LAB — PSA: PSA: 1.8 ng/mL (ref <=4.0)

## 2020-01-26 DIAGNOSIS — L821 Other seborrheic keratosis: Secondary | ICD-10-CM | POA: Diagnosis not present

## 2020-01-26 DIAGNOSIS — L578 Other skin changes due to chronic exposure to nonionizing radiation: Secondary | ICD-10-CM | POA: Diagnosis not present

## 2020-01-26 DIAGNOSIS — D485 Neoplasm of uncertain behavior of skin: Secondary | ICD-10-CM | POA: Diagnosis not present

## 2020-01-26 DIAGNOSIS — L570 Actinic keratosis: Secondary | ICD-10-CM | POA: Diagnosis not present

## 2020-01-26 DIAGNOSIS — D225 Melanocytic nevi of trunk: Secondary | ICD-10-CM | POA: Diagnosis not present

## 2020-01-26 DIAGNOSIS — D045 Carcinoma in situ of skin of trunk: Secondary | ICD-10-CM | POA: Diagnosis not present

## 2020-02-02 ENCOUNTER — Other Ambulatory Visit: Payer: Self-pay | Admitting: Internal Medicine

## 2020-02-04 ENCOUNTER — Telehealth: Payer: Self-pay | Admitting: Internal Medicine

## 2020-02-04 NOTE — Progress Notes (Signed)
  Chronic Care Management   Outreach Note  02/04/2020 Name: Nathan Reilly MRN: [id number] DOB: 1941/02/15  Referred by: Hoyt Koch, MD Reason for referral : No chief complaint on file.   An unsuccessful telephone outreach was attempted today. The patient was referred to the pharmacist for assistance with care management and care coordination.   Follow Up Plan:   Earney Hamburg Upstream Scheduler

## 2020-02-10 ENCOUNTER — Telehealth: Payer: Self-pay | Admitting: Internal Medicine

## 2020-02-10 NOTE — Progress Notes (Signed)
°  Chronic Care Management   Outreach Note  02/10/2020 Name: Nathan Reilly MRN: [id number] DOB: May 31, 1941  Referred by: Hoyt Koch, MD Reason for referral : No chief complaint on file.   An unsuccessful telephone outreach was attempted today. The patient was referred to the pharmacist for assistance with care management and care coordination.   Follow Up Plan:   Earney Hamburg Upstream Scheduler

## 2020-02-11 ENCOUNTER — Telehealth: Payer: Self-pay | Admitting: Internal Medicine

## 2020-02-11 DIAGNOSIS — I10 Essential (primary) hypertension: Secondary | ICD-10-CM

## 2020-02-11 NOTE — Progress Notes (Signed)
  Chronic Care Management   Note  02/11/2020 Name: Nathan Reilly MRN: [id number] DOB: Sep 15, 1940  Nathan Reilly is a 79 y.o. year old male who is a primary care patient of Hoyt Koch, MD. I reached out to [name] by phone today in response to a referral sent by Nathan Reilly's PCP, Hoyt Koch, MD.   Nathan Reilly was given information about Chronic Care Management services today including:  1. CCM service includes personalized support from designated clinical staff supervised by his physician, including individualized plan of care and coordination with other care providers 2. 24/7 contact phone numbers for assistance for urgent and routine care needs. 3. Service will only be billed when office clinical staff spend 20 minutes or more in a month to coordinate care. 4. Only one practitioner may furnish and bill the service in a calendar month. 5. The patient may stop CCM services at any time (effective at the end of the month) by phone call to the office staff.   Patient agreed to services and verbal consent obtained.   Follow up plan:   Earney Hamburg Upstream Scheduler

## 2020-03-01 ENCOUNTER — Other Ambulatory Visit: Payer: Self-pay | Admitting: Internal Medicine

## 2020-03-16 DIAGNOSIS — D045 Carcinoma in situ of skin of trunk: Secondary | ICD-10-CM | POA: Diagnosis not present

## 2020-04-14 ENCOUNTER — Telehealth: Payer: Self-pay | Admitting: Pharmacist

## 2020-04-14 NOTE — Progress Notes (Addendum)
    Chronic Care Management Pharmacy Assistant   Name: Nathan Reilly  MRN: [id number] DOB: September 16, 1940  Reason for Encounter: Medication Review-Initial Questions for Pharmacist visit for 04/17/2020  Spoke with patient to remind him about initial CCM phone visit with CPP Charlene Brooke on 11/8 at 9:00 am. Pt voiced understanding.  Wendy Poet, CPA (Clinical Pharmacist Assistant)

## 2020-04-17 ENCOUNTER — Other Ambulatory Visit: Payer: Self-pay

## 2020-04-17 ENCOUNTER — Ambulatory Visit: Payer: Medicare Other | Admitting: Pharmacist

## 2020-04-17 DIAGNOSIS — E039 Hypothyroidism, unspecified: Secondary | ICD-10-CM

## 2020-04-17 DIAGNOSIS — I10 Essential (primary) hypertension: Secondary | ICD-10-CM

## 2020-04-17 NOTE — Chronic Care Management (AMB) (Signed)
Chronic Care Management Pharmacy  Name: Nathan Reilly  MRN: [id number] DOB: December 23, 1940   Chief Complaint/ HPI  Nathan Reilly,  79 y.o. , male presents for their Initial CCM visit with the clinical pharmacist via telephone due to COVID-19 Pandemic.  PCP : Nathan Koch, MD Patient Care Team: Nathan Koch, MD as PCP - General (Internal Medicine) Nathan Reilly, [place] (Optometry) [place], Nathan Reilly, [facility] as Pharmacist (Pharmacist)  Their chronic conditions include: Hypertension and Hypothyroidism   Patient has lived in [place] for 30 years. Son lives in [place], another son in [place]. Works part-time at [employer] course, 20-25 hrs per week, lifting 40-lb crates of golf balls. He used to go to the gym for cardio, but hasn't during pandemic.   Of note patient reports within the last 30 days he was told by [facility] that carbon monoxide levels in his home were elevated, they fixed the issue and levels were normal on repeat check. Pt reports some stomach upset but denies headache, vomiting, confusion, or flu-like symptoms.   Office Visits: 01/06/20 Dr Sharlet Salina OV: chronic f/u, labs stable, no med changes.  Consult Visit: 12/17/19 ED visit: mechanical fall, laceration repaired, no head imaging necessary.  No Known Allergies  Medications: Outpatient Encounter Medications as of 04/17/2020  Medication Sig  . doxazosin (CARDURA) 4 MG tablet TAKE 1 TABLET BY MOUTH EVERY DAY  . fluticasone (FLONASE) 50 MCG/ACT nasal spray Place 2 sprays into both nostrils daily. Annual appt due in June must see provider for future refills  . levothyroxine (SYNTHROID) 50 MCG tablet TAKE 1 TABLET BY MOUTH EVERY DAY  . lisinopril (ZESTRIL) 10 MG tablet TAKE 1 TABLET BY MOUTH EVERY DAY  . Polyethyl Glycol-Propyl Glycol 0.4-0.3 % GEL Apply 1 drop to eye daily.  . sildenafil (REVATIO) 20 MG tablet Take 1-3 tablets (20-60 mg total) by mouth 3 (three) times daily as needed. (Patient not taking: Reported on  04/17/2020)   No facility-administered encounter medications on file as of 04/17/2020.     Wt Readings from Last 3 Encounters:  01/06/20 174 lb (78.9 kg)  12/17/19 177 lb (80.3 kg)  01/04/19 172 lb (78 kg)    Current Diagnosis/Assessment:  SDOH Interventions     Most Recent Value  SDOH Interventions  Financial Strain Interventions Intervention Not Indicated      Goals Addressed            This Visit's Progress   . Pharmacy Care Plan       CARE PLAN ENTRY (see longitudinal plan of care for additional care plan information)  Current Barriers:  . Chronic Disease Management support, education, and care coordination needs related to Hypertension and Hypothyroidism   Hypertension BP Readings from Last 3 Encounters:  01/06/20 114/76  12/17/19 123/80  01/04/19 140/84 .  Pharmacist Clinical Goal(s): o Over the next 365 days, patient will work with PharmD and providers to maintain BP goal <130/80 . Current regimen:  o Doxazosin 4 mg daily o Lisinopril 10 mg daily . Interventions: o Discussed BP goals and benefits of medications for prevention of heart attack / stroke . Patient self care activities - Over the next 365 days, patient will: o Check BP as needed, document, and provide at future appointments o Ensure daily salt intake < 2300 mg/day  Hypothyroidism . Pharmacist Clinical Goal(s) o Over the next 365 days, patient will work with PharmD and providers to optimize therapy . Current regimen:  o Levothyroxine 50 mcg daily . Interventions:  o Discussed importance of separating levothyroxine from food and other medications . Patient self care activities - Over the next 365 days, patient will: o Continue current medication  Medication management . Pharmacist Clinical Goal(s): o Over the next 365 days, patient will work with PharmD and providers to maintain optimal medication adherence . [facility] . Interventions o Comprehensive medication review  performed. o Continue current medication management strategy . Patient self care activities - Over the next 365 days, patient will: o Focus on medication adherence by fill date o Take medications as prescribed o Report any questions or concerns to PharmD and/or provider(s)  Initial goal documentation       Hypertension   BP goal is:  <130/80  Office blood pressures are  BP Readings from Last 3 Encounters:  01/06/20 114/76  12/17/19 123/80  01/04/19 140/84   Lab Results  Component Value Date   CREATININE 1.32 (H) 01/06/2020   BUN 21 01/06/2020   GFR 57.39 (L) 01/04/2019   GFRNONAA 66 (L) 07/28/2014   GFRAA 77 (L) 07/28/2014   NA 138 01/06/2020   K 4.4 01/06/2020   CALCIUM 9.2 01/06/2020   CO2 23 01/06/2020   Patient checks BP at home infrequently Patient home BP readings are ranging: n/a  Patient has failed these meds in the past: n/a Patient is currently controlled on the following medications:  . Doxazosin 4 mg daily . Lisinopril 10 mg daily  We discussed: pt has been on BP meds since 1990. He does report occasional dizziness/vertigo that is unclear if related to low BP; discussed importance of adequate hydration, pt reports drinking 3-4 16oz water bottles daily.   Plan  Continue current medications  Recommend to check BP when feeling dizzy  Hypothyroidism   Lab Results  Component Value Date/Time   TSH 4.52 (H) 01/06/2020 08:26 AM   TSH 3.47 01/04/2019 08:56 AM   FREET4 1.3 01/06/2020 08:26 AM   FREET4 0.86 01/04/2019 08:56 AM   Patient has failed these meds in past: n/a  Patient is currently controlled on the following medications:  . Levothyroxine 50 mcg daily  We discussed:  Pt takes med first thing in AM, waits 30 min before eating.  Plan  Continue current medications  Vaccines   Reviewed and discussed patient's vaccination history.   Patient reports he has lost his original vaccine card with first 2 doses. His doses are recorded in [facility].    Immunization History  Administered Date(s) Administered  . Fluad Quad(high Dose 65+) 02/12/2019  . Influenza Split 04/11/2011, 04/02/2012  . Influenza Whole 02/15/2010  . Influenza, High Dose Seasonal PF 04/06/2014, 04/01/2016, 04/12/2017, 02/18/2018  . Influenza, Seasonal, Injecte, Preservative Fre 03/03/2013  . Influenza,inj,Quad PF,6+ Mos 02/04/2020  . Influenza-Unspecified 03/15/2015, 04/12/2017, 02/18/2018  . PFIZER SARS-COV-2 Vaccination 07/15/2019, 08/09/2019, 03/06/2020  . Pneumococcal Conjugate-13 07/15/2013  . Pneumococcal Polysaccharide-23 02/03/2006, 06/24/2012  . Td 02/15/2010  . Tdap 12/17/2019  . Zoster 02/03/2006  . Zoster Recombinat (Shingrix) 03/09/2018, 05/19/2018    Plan   Patient is up-to-date on vaccines Patient can use MyChart as proof of vaccination or print out a record from [email or web address]  Carbon monoxide exposure    We discussed: symptoms of CO poisoning; pt denies symptoms except for upset stomach and occasional dizziness (which has been a chronic, intermittent issue for him for years). It is unlikely that he is experiencing a severe CO poisoning and he does not require immediate medical attention, but advised him to make appt to PCP for  exam.  Plan  Schedule PCP visit  Health Maintenance   Patient is currently controlled on the following medications:  . Fluticasone nasal spray PRN . Sildenafil 20 mg 1-3 tab TID prn . Artificial tears eye drops . Fish oil 1000 mg PRN  We discussed:  Patient is satisfied with current regimen and denies issues   Plan  Continue current medications   Medication Management   [facility] Uses pill box? No - prefers bottles Pt endorses 100% compliance  We discussed: Current pharmacy is preferred with insurance plan and patient is satisfied with pharmacy services  Plan  Continue current medication management strategy    Follow up: 12 month phone visit  Charlene Brooke,  PharmD, BCACP Clinical Pharmacist [place] [facility] at [facility] [phone]

## 2020-04-17 NOTE — Patient Instructions (Signed)
Visit Information  Phone number for Pharmacist: [phone]  Thank you for meeting with me to discuss your medications! I look forward to working with you to achieve your health care goals. Below is a summary of what we talked about during the visit:  Goals Addressed            This Visit's Progress   . Pharmacy Care Plan       CARE PLAN ENTRY (see longitudinal plan of care for additional care plan information)  Current Barriers:  . Chronic Disease Management support, education, and care coordination needs related to Hypertension and Hypothyroidism   Hypertension BP Readings from Last 3 Encounters:  01/06/20 114/76  12/17/19 123/80  01/04/19 140/84 .  Pharmacist Clinical Goal(s): o Over the next 365 days, patient will work with PharmD and providers to maintain BP goal <130/80 . Current regimen:  o Doxazosin 4 mg daily o Lisinopril 10 mg daily . Interventions: o Discussed BP goals and benefits of medications for prevention of heart attack / stroke . Patient self care activities - Over the next 365 days, patient will: o Check BP as needed, document, and provide at future appointments o Ensure daily salt intake < 2300 mg/day  Hypothyroidism . Pharmacist Clinical Goal(s) o Over the next 365 days, patient will work with PharmD and providers to optimize therapy . Current regimen:  o Levothyroxine 50 mcg daily . Interventions: o Discussed importance of separating levothyroxine from food and other medications . Patient self care activities - Over the next 365 days, patient will: o Continue current medication  Medication management . Pharmacist Clinical Goal(s): o Over the next 365 days, patient will work with PharmD and providers to maintain optimal medication adherence . [facility] . Interventions o Comprehensive medication review performed. o Continue current medication management strategy . Patient self care activities - Over the next 365 days, patient  will: o Focus on medication adherence by fill date o Take medications as prescribed o Report any questions or concerns to PharmD and/or provider(s)  Initial goal documentation      Mr. Aloisi was given information about Chronic Care Management services today including:  1. CCM service includes personalized support from designated clinical staff supervised by his physician, including individualized plan of care and coordination with other care providers 2. 24/7 contact phone numbers for assistance for urgent and routine care needs. 3. Standard insurance, coinsurance, copays and deductibles apply for chronic care management only during months in which we provide at least 20 minutes of these services. Most insurances cover these services at 100%, however patients may be responsible for any copay, coinsurance and/or deductible if applicable. This service may help you avoid the need for more expensive face-to-face services. 4. Only one practitioner may furnish and bill the service in a calendar month. 5. The patient may stop CCM services at any time (effective at the end of the month) by phone call to the office staff.  Patient agreed to services and verbal consent obtained.   Patient verbalizes understanding of instructions provided today.  Telephone follow up appointment with pharmacy team member scheduled for: 1 year  Charlene Brooke, PharmD, [facility] Clinical Pharmacist [place] [facility] at [facility] [phone]

## 2020-04-18 NOTE — Addendum Note (Signed)
Addended by: Jerrol Banana A on: 04/18/2020 10:05 AM   Modules accepted: Orders

## 2020-05-02 ENCOUNTER — Encounter: Payer: Self-pay | Admitting: Internal Medicine

## 2021-01-08 DIAGNOSIS — R339 Retention of urine, unspecified: Secondary | ICD-10-CM

## 2021-01-08 DIAGNOSIS — Z8619 Personal history of other infectious and parasitic diseases: Secondary | ICD-10-CM

## 2021-01-08 DIAGNOSIS — Z87898 Personal history of other specified conditions: Secondary | ICD-10-CM

## 2021-01-08 HISTORY — DX: Personal history of other specified conditions: Z87.898

## 2021-01-08 HISTORY — DX: Retention of urine, unspecified: R33.9

## 2021-01-08 HISTORY — DX: Personal history of other infectious and parasitic diseases: Z86.19

## 2021-01-11 ENCOUNTER — Other Ambulatory Visit: Payer: Self-pay

## 2021-01-11 ENCOUNTER — Ambulatory Visit (INDEPENDENT_AMBULATORY_CARE_PROVIDER_SITE_OTHER): Payer: Medicare Other | Admitting: Internal Medicine

## 2021-01-11 ENCOUNTER — Encounter: Payer: Self-pay | Admitting: Internal Medicine

## 2021-01-11 VITALS — BP 112/70 | HR 51 | Temp 98.5°F | Resp 18 | Ht 69.0 in | Wt 169.6 lb

## 2021-01-11 DIAGNOSIS — E039 Hypothyroidism, unspecified: Secondary | ICD-10-CM

## 2021-01-11 DIAGNOSIS — N400 Enlarged prostate without lower urinary tract symptoms: Secondary | ICD-10-CM | POA: Insufficient documentation

## 2021-01-11 DIAGNOSIS — Z86010 Personal history of colonic polyps: Secondary | ICD-10-CM | POA: Diagnosis not present

## 2021-01-11 DIAGNOSIS — I10 Essential (primary) hypertension: Secondary | ICD-10-CM | POA: Diagnosis not present

## 2021-01-11 DIAGNOSIS — Z0000 Encounter for general adult medical examination without abnormal findings: Secondary | ICD-10-CM

## 2021-01-11 LAB — COMPREHENSIVE METABOLIC PANEL
ALT: 24 U/L (ref 0–53)
AST: 27 U/L (ref 0–37)
Albumin: 4.2 g/dL (ref 3.5–5.2)
Alkaline Phosphatase: 73 U/L (ref 39–117)
BUN: 20 mg/dL (ref 6–23)
CO2: 24 mEq/L (ref 19–32)
Calcium: 9.2 mg/dL (ref 8.4–10.5)
Chloride: 105 mEq/L (ref 96–112)
Creatinine, Ser: 1.28 mg/dL (ref 0.40–1.50)
GFR: 52.89 mL/min — ABNORMAL LOW (ref >60.00)
Glucose, Bld: 108 mg/dL — ABNORMAL HIGH (ref 70–99)
Potassium: 4.5 mEq/L (ref 3.5–5.1)
Sodium: 138 mEq/L (ref 135–145)
Total Bilirubin: 0.6 mg/dL (ref 0.2–1.2)
Total Protein: 6.9 g/dL (ref 6.0–8.3)

## 2021-01-11 LAB — LIPID PANEL
Cholesterol: 181 mg/dL (ref 0–200)
HDL: 66.5 mg/dL (ref >39.00)
LDL Cholesterol: 105 mg/dL — ABNORMAL HIGH (ref 0–99)
NonHDL: 114.63
Total CHOL/HDL Ratio: 3
Triglycerides: 50 mg/dL (ref 0.0–149.0)
VLDL: 10 mg/dL (ref 0.0–40.0)

## 2021-01-11 LAB — T4, FREE: Free T4: 1.04 ng/dL (ref 0.60–1.60)

## 2021-01-11 LAB — CBC
HCT: 44.1 % (ref 39.0–52.0)
Hemoglobin: 14.7 g/dL (ref 13.0–17.0)
MCHC: 33.4 g/dL (ref 30.0–36.0)
MCV: 93.9 fl (ref 78.0–100.0)
Platelets: 160 10*3/uL (ref 150.0–400.0)
RBC: 4.7 Mil/uL (ref 4.22–5.81)
RDW: 14 % (ref 11.5–15.5)
WBC: 5.7 10*3/uL (ref 4.0–10.5)

## 2021-01-11 LAB — PSA: PSA: 2.84 ng/mL (ref 0.10–4.00)

## 2021-01-11 LAB — TSH: TSH: 3.23 u[IU]/mL (ref 0.35–5.50)

## 2021-01-11 NOTE — Assessment & Plan Note (Signed)
Flu shot yearly. Covid-19 4 shots. Pneumonia complete. Shingrix complete. Tetanus due 2031. Colonoscopy due refer to GI done today. Counseled about sun safety and mole surveillance. Counseled about the dangers of distracted driving. Given 10 year screening recommendations.

## 2021-01-11 NOTE — Assessment & Plan Note (Signed)
BP at goal on lisinopril 10 mg daily and cardura 4 mg daily. Checking CMP and adjust as needed.

## 2021-01-11 NOTE — Assessment & Plan Note (Signed)
Checking PSA and taking cardura 4 mg daily which we will continue.

## 2021-01-11 NOTE — Assessment & Plan Note (Signed)
Due for colonoscopy 2022 and refer to GI for this. We talked about how likely this would be last needed.

## 2021-01-11 NOTE — Patient Instructions (Addendum)
The goal for blood pressure is 130/80. Your EKG is normal today and not changed from before.

## 2021-01-11 NOTE — Assessment & Plan Note (Signed)
Checking TSH and free T4 and adjust synthroid 50 mcg daily as needed.  

## 2021-01-11 NOTE — Progress Notes (Signed)
Subjective:   Patient ID: Nathan Reilly, male    DOB: 01-21-1941, 80 y.o.   MRN: [id number]  HPI Here for medicare wellness and physical, no new complaints. Please see A/P for status and treatment of chronic medical problems.   Diet: heart healthy Physical activity: plays golf and active Depression/mood screen: negative Hearing: intact to whispered voice Visual acuity: grossly normal with lens, performs annual eye exam  ADLs: capable Fall risk: none Home safety: good Cognitive evaluation: intact to orientation, naming, recall and repetition EOL planning: adv directives discussed  [place] Visit from 01/11/2021 in [place] at [facility] Total Score 0        Fall Risk  01/11/2021 01/06/2020 01/04/2019 05/14/2017 07/04/2015  Falls in the past year? 0 1 0 No No  Number falls in past yr: 0 0 - - -  Injury with Fall? 0 0 - - -    I have personally reviewed and have noted 1. The patient's medical and social history - reviewed today no changes 2. Their use of alcohol, tobacco or illicit drugs 3. Their current medications and supplements 4. The patient's functional ability including ADL's, fall risks, home safety risks and hearing or visual impairment. 5. Diet and physical activities 6. Evidence for depression or mood disorders 7. Care team reviewed and updated 8.  The patient is not on an opioid pain medication.  Patient Care Team: Hoyt Koch, MD as PCP - General (Internal Medicine) Marica Otter, [place] (Optometry) Lenord Fellers, Cleaster Corin, [facility] as Pharmacist (Pharmacist) Past Medical History:  Diagnosis Date   Arthritis    Cancer [facility])    HX SKIN CANCER   Enlarged prostate    GERD (gastroesophageal reflux disease)    OCCASIONAL   Hay fever    History of skin cancer    Hypertension    Scarlet fever    IN CHILDHOOD   Temporomandibular joint disorders, unspecified    Past Surgical History:  Procedure Laterality Date   BAND  HEMORRHOIDECTOMY  2015   Dr Earlean Shawl   POLYPECTOMY  2014   Dr Earlean Shawl   TONSILLECTOMY     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   HENT: Negative.    Eyes: Negative.   Respiratory:  Negative for cough, chest tightness and shortness of breath.   Cardiovascular:  Negative for chest pain, palpitations and leg swelling.  Gastrointestinal:  Negative for abdominal distention, abdominal pain, constipation, diarrhea, nausea and vomiting.  Musculoskeletal: Negative.   Skin: Negative.   Neurological: Negative.   Psychiatric/Behavioral: Negative.     Objective:  Physical Exam Constitutional:      Appearance: He is well-developed.  HENT:     Head: Normocephalic and atraumatic.  Cardiovascular:     Rate and Rhythm: Normal rate and regular rhythm.  Pulmonary:     Effort: Pulmonary effort is normal. No respiratory distress.     Breath sounds: Normal breath sounds. No wheezing or rales.  Abdominal:     General: Bowel sounds are normal. There is no distension.     Palpations: Abdomen is  soft.     Tenderness: There is no abdominal tenderness. There is no rebound.  Musculoskeletal:     Cervical back: Normal range of motion.  Skin:    General: Skin is warm and dry.  Neurological:     Mental Status: He is alert and oriented to person, place, and time.     Coordination: Coordination normal.    Vitals:   01/11/21 0757  BP: 112/70  Pulse: (!) 51  Resp: 18  Temp: 98.5 F (36.9 C)  TempSrc: Oral  SpO2: 99%  Weight: 169 lb 9.6 oz (76.9 kg)  Height: '5\' 9"'$  (1.753 m)   EKG: Rate 43, axis normal, interval normal, sinus brady, no st or t wave changes, no  significant change compared to prior 2016  This visit occurred during the SARS-CoV-2 public health emergency.  Safety protocols were in place, including screening questions prior to the visit, additional usage of staff PPE, and extensive cleaning of exam room while observing appropriate contact time as indicated for disinfecting solutions.   Assessment & Plan:

## 2021-01-21 DIAGNOSIS — R309 Painful micturition, unspecified: Secondary | ICD-10-CM | POA: Diagnosis not present

## 2021-01-21 DIAGNOSIS — E039 Hypothyroidism, unspecified: Secondary | ICD-10-CM | POA: Diagnosis not present

## 2021-01-21 DIAGNOSIS — N320 Bladder-neck obstruction: Secondary | ICD-10-CM | POA: Diagnosis not present

## 2021-01-21 DIAGNOSIS — I959 Hypotension, unspecified: Secondary | ICD-10-CM | POA: Diagnosis not present

## 2021-01-21 DIAGNOSIS — N179 Acute kidney failure, unspecified: Secondary | ICD-10-CM | POA: Diagnosis not present

## 2021-01-21 DIAGNOSIS — A419 Sepsis, unspecified organism: Secondary | ICD-10-CM | POA: Diagnosis not present

## 2021-01-21 DIAGNOSIS — N390 Urinary tract infection, site not specified: Secondary | ICD-10-CM | POA: Diagnosis not present

## 2021-01-21 DIAGNOSIS — N138 Other obstructive and reflux uropathy: Secondary | ICD-10-CM | POA: Diagnosis not present

## 2021-01-21 DIAGNOSIS — I10 Essential (primary) hypertension: Secondary | ICD-10-CM | POA: Diagnosis not present

## 2021-01-22 DIAGNOSIS — N179 Acute kidney failure, unspecified: Secondary | ICD-10-CM | POA: Diagnosis not present

## 2021-01-22 DIAGNOSIS — N390 Urinary tract infection, site not specified: Secondary | ICD-10-CM | POA: Diagnosis not present

## 2021-01-22 DIAGNOSIS — R309 Painful micturition, unspecified: Secondary | ICD-10-CM | POA: Diagnosis not present

## 2021-01-22 DIAGNOSIS — N138 Other obstructive and reflux uropathy: Secondary | ICD-10-CM | POA: Diagnosis not present

## 2021-01-23 ENCOUNTER — Telehealth: Payer: Self-pay

## 2021-01-23 ENCOUNTER — Other Ambulatory Visit: Payer: Self-pay | Admitting: Internal Medicine

## 2021-01-23 DIAGNOSIS — N390 Urinary tract infection, site not specified: Secondary | ICD-10-CM | POA: Diagnosis not present

## 2021-01-23 DIAGNOSIS — R339 Retention of urine, unspecified: Secondary | ICD-10-CM

## 2021-01-23 DIAGNOSIS — N179 Acute kidney failure, unspecified: Secondary | ICD-10-CM | POA: Diagnosis not present

## 2021-01-23 DIAGNOSIS — R309 Painful micturition, unspecified: Secondary | ICD-10-CM | POA: Diagnosis not present

## 2021-01-23 DIAGNOSIS — N320 Bladder-neck obstruction: Secondary | ICD-10-CM | POA: Diagnosis not present

## 2021-01-23 DIAGNOSIS — N138 Other obstructive and reflux uropathy: Secondary | ICD-10-CM | POA: Diagnosis not present

## 2021-01-23 DIAGNOSIS — I10 Essential (primary) hypertension: Secondary | ICD-10-CM | POA: Diagnosis not present

## 2021-01-23 DIAGNOSIS — E039 Hypothyroidism, unspecified: Secondary | ICD-10-CM | POA: Diagnosis not present

## 2021-01-23 DIAGNOSIS — I959 Hypotension, unspecified: Secondary | ICD-10-CM | POA: Diagnosis not present

## 2021-01-23 NOTE — Telephone Encounter (Signed)
I have spoken with the pt and [place] a [place] U[place] and will be released on tomorrow. Pt has stated he is being released with a Cath and was told to have f/u apptmnt with his PCP for Thursday. Due to Dr. Sharlet Salina not being in the office after today I was able to schedule pt with Dr. Mitchel Honour.

## 2021-01-23 NOTE — Telephone Encounter (Signed)
Fyi.

## 2021-01-23 NOTE — Telephone Encounter (Signed)
Urgent referral placed for urology as if he is being discharged with a catheter in place we will need him to do a voiding trial with them. Keep visit with Sagardia as scheduled.

## 2021-01-24 DIAGNOSIS — N138 Other obstructive and reflux uropathy: Secondary | ICD-10-CM | POA: Diagnosis not present

## 2021-01-24 DIAGNOSIS — N390 Urinary tract infection, site not specified: Secondary | ICD-10-CM | POA: Diagnosis not present

## 2021-01-24 DIAGNOSIS — R309 Painful micturition, unspecified: Secondary | ICD-10-CM | POA: Diagnosis not present

## 2021-01-24 DIAGNOSIS — N179 Acute kidney failure, unspecified: Secondary | ICD-10-CM | POA: Diagnosis not present

## 2021-01-25 ENCOUNTER — Other Ambulatory Visit: Payer: Self-pay

## 2021-01-25 ENCOUNTER — Encounter: Payer: Self-pay | Admitting: Emergency Medicine

## 2021-01-25 ENCOUNTER — Ambulatory Visit (INDEPENDENT_AMBULATORY_CARE_PROVIDER_SITE_OTHER): Payer: Medicare Other | Admitting: Emergency Medicine

## 2021-01-25 VITALS — BP 136/62 | HR 63 | Temp 98.0°F | Ht 69.0 in | Wt 169.0 lb

## 2021-01-25 DIAGNOSIS — Z87440 Personal history of urinary (tract) infections: Secondary | ICD-10-CM | POA: Insufficient documentation

## 2021-01-25 DIAGNOSIS — A0472 Enterocolitis due to Clostridium difficile, not specified as recurrent: Secondary | ICD-10-CM | POA: Insufficient documentation

## 2021-01-25 DIAGNOSIS — N401 Enlarged prostate with lower urinary tract symptoms: Secondary | ICD-10-CM

## 2021-01-25 DIAGNOSIS — N138 Other obstructive and reflux uropathy: Secondary | ICD-10-CM | POA: Diagnosis not present

## 2021-01-25 DIAGNOSIS — A09 Infectious gastroenteritis and colitis, unspecified: Secondary | ICD-10-CM | POA: Diagnosis not present

## 2021-01-25 LAB — POCT URINALYSIS DIP (MANUAL ENTRY)
Bilirubin, UA: NEGATIVE
Glucose, UA: NEGATIVE mg/dL
Leukocytes, UA: NEGATIVE
Nitrite, UA: NEGATIVE
Spec Grav, UA: 1.025 (ref 1.010–1.025)
Urobilinogen, UA: 0.2 E.U./dL
pH, UA: 5.5 (ref 5.0–8.0)

## 2021-01-25 LAB — CBC WITH DIFFERENTIAL/PLATELET
Basophils Absolute: 0 10*3/uL (ref 0.0–0.1)
Basophils Relative: 0.3 % (ref 0.0–3.0)
Eosinophils Absolute: 0.1 10*3/uL (ref 0.0–0.7)
Eosinophils Relative: 0.6 % (ref 0.0–5.0)
HCT: 42.4 % (ref 39.0–52.0)
Hemoglobin: 14.1 g/dL (ref 13.0–17.0)
Lymphocytes Relative: 10.2 % — ABNORMAL LOW (ref 12.0–46.0)
Lymphs Abs: 0.9 10*3/uL (ref 0.7–4.0)
MCHC: 33.2 g/dL (ref 30.0–36.0)
MCV: 94.1 fl (ref 78.0–100.0)
Monocytes Absolute: 0.8 10*3/uL (ref 0.1–1.0)
Monocytes Relative: 9 % (ref 3.0–12.0)
Neutro Abs: 7.1 10*3/uL (ref 1.4–7.7)
Neutrophils Relative %: 79.9 % — ABNORMAL HIGH (ref 43.0–77.0)
Platelets: 161 10*3/uL (ref 150.0–400.0)
RBC: 4.5 Mil/uL (ref 4.22–5.81)
RDW: 13.5 % (ref 11.5–15.5)
WBC: 8.9 10*3/uL (ref 4.0–10.5)

## 2021-01-25 LAB — COMPREHENSIVE METABOLIC PANEL
ALT: 40 U/L (ref 0–53)
AST: 45 U/L — ABNORMAL HIGH (ref 0–37)
Albumin: 3.7 g/dL (ref 3.5–5.2)
Alkaline Phosphatase: 73 U/L (ref 39–117)
BUN: 22 mg/dL (ref 6–23)
CO2: 23 mEq/L (ref 19–32)
Calcium: 8.9 mg/dL (ref 8.4–10.5)
Chloride: 102 mEq/L (ref 96–112)
Creatinine, Ser: 1.27 mg/dL (ref 0.40–1.50)
GFR: 53.38 mL/min — ABNORMAL LOW (ref >60.00)
Glucose, Bld: 92 mg/dL (ref 70–99)
Potassium: 3.9 mEq/L (ref 3.5–5.1)
Sodium: 135 mEq/L (ref 135–145)
Total Bilirubin: 0.8 mg/dL (ref 0.2–1.2)
Total Protein: 6.9 g/dL (ref 6.0–8.3)

## 2021-01-25 NOTE — Progress Notes (Signed)
Nathan Reilly 80 y.o.   Chief Complaint  Patient presents with   Recurrent UTI    Follow up. Pt has diarrhea and would like something for it.    HISTORY OF PRESENT ILLNESS: This is a 80 y.o. male here for follow-up of hospital discharge. Presented to the hospital 4 to 5 days ago with urinary retention fever and chills.  History of BPH.  History of prostate cancer. Found to be septic from a UTI.  Admitted and started on IV antibiotics.  Soon after developed diarrhea and was diagnosed with C. difficile infection.  Still on Augmentin to complete 10-day course of treatment.  Was started on oral vancomycin 125 mg 3 times a day yesterday. Urinary catheter in place.  Still having nonbloody diarrhea.  Denies abdominal pain, nausea or vomiting.  No fever or chills. Urgent urology referral was placed 01/23/2021. Overall clinically better.   HPI   Prior to Admission medications   Medication Sig Start Date End Date Taking? Authorizing Provider  doxazosin (CARDURA) 4 MG tablet TAKE 1 TABLET BY MOUTH EVERY DAY 03/01/20  Yes Hoyt Koch, MD  fluticasone [facility]) 50 MCG/ACT nasal spray Place 2 sprays into both nostrils daily. Annual appt due in June must see provider for future refills 12/10/17  Yes Hoyt Koch, MD  levothyroxine (SYNTHROID) 50 MCG tablet TAKE 1 TABLET BY MOUTH EVERY DAY 01/24/21  Yes Hoyt Koch, MD  lisinopril (ZESTRIL) 10 MG tablet TAKE 1 TABLET BY MOUTH EVERY DAY 01/24/21  Yes Hoyt Koch, MD  Polyethyl Glycol-Propyl Glycol 0.4-0.3 % GEL Apply 1 drop to eye daily.   Yes [provider]  sildenafil (REVATIO) 20 MG tablet Take 1-3 tablets (20-60 mg total) by mouth 3 (three) times daily as needed. 11/15/15  Yes Hoyt Koch, MD    No Known Allergies  Patient Active Problem List   Diagnosis Date Noted   Enlarged prostate 01/11/2021   Hypothyroidism 05/14/2017   Routine health maintenance 04/12/2011   Essential hypertension  07/17/2007   Hx of colonic polyps 07/17/2007    Past Medical History:  Diagnosis Date   Arthritis    Cancer ([place])    HX SKIN CANCER   Enlarged prostate    GERD (gastroesophageal reflux disease)    OCCASIONAL   Hay fever    History of skin cancer    Hypertension    Scarlet fever    IN CHILDHOOD   Temporomandibular joint disorders, unspecified     Past Surgical History:  Procedure Laterality Date   BAND HEMORRHOIDECTOMY  2015   Dr Earlean Shawl   POLYPECTOMY  2014   Dr Earlean Shawl   TONSILLECTOMY     TOTAL KNEE ARTHROPLASTY Left 07/26/2014   Procedure: LEFT TOTAL KNEE ARTHROPLASTY;  Surgeon: Mauri Pole, MD;  Location: [facility];  Service: Orthopedics;  Laterality: Left;    Social History   Socioeconomic History   Marital status: Married    Spouse name: Not on file   Number of children: 2   Years of education: 16   Highest education level: Not on file  Occupational History   Occupation: sales    Comment: retired  Tobacco Use   Smoking status: Never   Smokeless tobacco: Never  Substance and Sexual Activity   Alcohol use: Yes    Alcohol/week: 6.0 standard drinks    Types: 6 Standard drinks or equivalent per week    Comment: OCCASIONAL   Drug use: No   Sexual activity: Yes  Partners: Female  Other Topics Concern   Not on file  Social History Narrative   [employer], grad school for teachers certificate.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 Married '68, Marriage in good health. 2 sons- '72, '77; 3 grand-daughters.No complaints regarding sexual health. Retired - Orthoptist. ACP - does not want heroic or futile measures in the face of loss of function or quality of life.          Social Determinants of Health   Financial Resource Strain: Low Risk    Difficulty of Paying Living Expenses: Not hard at all  Food Insecurity: Not on file  Transportation Needs: Not on file  Physical Activity: Not on file  Stress: Not on file   Social Connections: Not on file  Intimate Partner Violence: Not on file    Family History  Problem Relation Age of Onset   Diabetes Father    Breast cancer Sister    Hypertension Neg Hx    Heart disease Neg Hx    Stroke Neg Hx      Review of Systems  Constitutional:  Negative for chills and fever.  HENT: Negative.  Negative for congestion and sore throat.   Respiratory: Negative.  Negative for cough and shortness of breath.   Cardiovascular:  Negative for chest pain and palpitations.  Gastrointestinal:  Positive for diarrhea. Negative for abdominal pain, blood in stool, nausea and vomiting.  Skin: Negative.  Negative for rash.  Neurological: Negative.  Negative for dizziness and headaches.  All other systems reviewed and are negative.  Today's Vitals   01/25/21 1108  Weight: 169 lb (76.7 kg)  Height: '5\' 9"'$  (1.753 m)   Body mass index is 24.96 kg/m.  Physical Exam Vitals reviewed.  Constitutional:      Appearance: Normal appearance.  HENT:     Head: Normocephalic.  Eyes:     Extraocular Movements: Extraocular movements intact.     Pupils: Pupils are equal, round, and reactive to light.  Cardiovascular:     Rate and Rhythm: Normal rate and regular rhythm.     Pulses: Normal pulses.     Heart sounds: Normal heart sounds.  Pulmonary:     Effort: Pulmonary effort is normal.     Breath sounds: Normal breath sounds.  Abdominal:     General: There is no distension.     Palpations: Abdomen is soft.     Tenderness: There is no abdominal tenderness.  Genitourinary:    Comments: Foley catheter in place, draining clear urine Musculoskeletal:        General: Normal range of motion.     Cervical back: Normal range of motion and neck supple.  Skin:    General: Skin is warm and dry.  Neurological:     General: No focal deficit present.     Mental Status: He is alert and oriented to person, place, and time.  Psychiatric:        Mood and Affect: Mood normal.         Behavior: Behavior normal.    Results for orders placed or performed in visit on 01/25/21 (from the past 24 hour(s))  POCT urinalysis dipstick     Status: Abnormal   Collection Time: 01/25/21  1:11 PM  Result Value Ref Range   Color, UA yellow yellow   Clarity, UA clear clear   Glucose, UA negative negative mg/dL   Bilirubin, UA negative negative   Ketones, POC UA trace (  5) (A) negative mg/dL   Spec Grav, UA 1.025 1.010 - 1.025   Blood, UA trace-intact (A) negative   pH, UA 5.5 5.0 - 8.0   Protein Ur, POC trace (A) negative mg/dL   Urobilinogen, UA 0.2 0.2 or 1.0 E.U./dL   Nitrite, UA Negative Negative   Leukocytes, UA Negative Negative    ASSESSMENT & PLAN: C. difficile colitis Diarrhea still ongoing.  Clinically stable.  Afebrile with normal vital signs and benign abdominal examination.  Was just started on vancomycin 125 mg 3 times a day. Advised to stay well-hydrated and avoid antidiarrheal agents as much as possible. ED precautions given.  BPH with urinary obstruction Foley catheter in place.  Draining well.  Urgent urology referral placed on 01/23/2021.  Recent urinary tract infection Need to continue Augmentin twice a day to complete 10-day course of treatment.  Braxdon was seen today for recurrent uti.  Diagnoses and all orders for this visit:  Diarrhea of infectious origin -     CBC with Differential/Platelet -     Comprehensive metabolic panel  Recent urinary tract infection -     Urine Culture -     POCT urinalysis dipstick  C. difficile colitis  BPH with urinary obstruction  Patient Instructions  Clostridioides Difficile Infection Clostridioides difficile infection, or C. diff, is an infection that is caused by C. diff germs (bacteria). This infection may happen after you take antibiotics that kill other germs and let C. diff germs grow. C. diff can be spread from person to person (is contagious). What are the causes? Taking certain antibiotics. Coming  in contact with people, food, or things that have C. diff. What increases the risk? Taking certain antibiotics for a long time. Staying in a hospital or long-term care facility for a long time. Being age 3 or older. Having had C. diff before or been exposed to C. diff. Having a weak disease-fighting system (immune system). Taking medicines that treat stomach acid. Having serious health problems, including: Colon cancer. Inflammatory bowel disease (IBD). Having had a procedure or surgery on your digestive system. What are the signs or symptoms? Watery poop (diarrhea). Fever. Not feeling hungry. Feeling like you may vomit. Swelling, pain, cramps, or a tender belly. How is this treated? Treatment may include: Stopping the antibiotics that caused the C. diff infection. Taking antibiotics that kill C. diff. Placing poop from a healthy person into your colon (fecal transplant). Doing surgery to take out the infected part of the colon. Follow these instructions at home: Medicines Take over-the-counter and prescription medicines only as told by your doctor. Take antibiotic medicine as told by your doctor. Do not stop taking it even if you start to feel better. Do not take medicines to treat watery poop unless your doctor tells you to. Eating and drinking  Follow instructions from your doctor about what to eat and drink. This may include eating bland foods in small amounts, such as: Bananas. Applesauce. Rice. Lean meats. Toast. Crackers. To prevent loss of fluid in your body (dehydration): Take in enough fluids to keep your pee pale yellow. This includes water, ice chips, clear fruit juice with water added to it, or low-calorie sports drinks. Take an ORS (oral rehydration solution). This drink is sold in pharmacies and retail stores. Avoid milk, caffeine, and alcohol.  General instructions Wash your hands often with soap and water. Do this for at least 20 seconds. Take a bath or  shower every day. Return to your normal activities when  your doctor says that it is safe. Keep all follow-up visits. How is this prevented? Personal hygiene  Wash your hands often with soap and water. Do this for at least 20 seconds. Wash your hands before you cook and after you use the bathroom. Other people should wash their hands too, especially: People who live with you. People who visit you in a hospital or clinic.  Contact precautions If you get watery poop while you are in the hospital or a long-term care facility, tell your doctor right away. When you visit someone in the hospital or a long-term care facility, wear a gown, gloves, or other protection. If possible: Stay away from people who have diarrhea. Use a separate bathroom if you are sick and live with other people. Clean environment Keep your home clean. Clean your home every day for at least a week after you leave the hospital. Clean surfaces that you touch every day. Use a product that has a 10% chlorine bleach solution. Be sure to: Read the label on your product to make sure that the product will kill the germs on your surfaces. Clean toilets and flush handles, bathtubs, sinks, doorknobs and handles, countertops, and work surfaces. If you are in the hospital, make sure the surfaces in your room are cleaned each day. Tell someone right away if body fluids have splashed or spilled. Clothes and linens Wash clothes and linens using laundry soap that has chlorine bleach. Be sure to: Use powder soap instead of liquid. Clean your washing machine once a month. To do this, turn on the hot setting with only soap in it. Contact a doctor if: Your symptoms do not get better or they get worse. Your symptoms go away and then come back. You have a fever. You have new symptoms. Get help right away if: Your belly is more tender or you have more pain. Your poop is mostly bloody. Your poop looks black. You vomit after you eat or  drink. You have signs of not having enough fluids in your body. These include: Dark yellow pee, very little pee, or no pee. Cracked lips or dry mouth. No tears when you cry. Sunken eyes. Feeling sleepy. Feeling weak or dizzy. Summary C. diff infection is an infection that may happen after you take antibiotic medicines. Symptoms include watery poop, fever, not feeling hungry, or feeling like you may vomit. Treatment includes stopping the antibiotics that made you sick and taking antibiotics that kill the C. diff germs. Poop from a healthy person may also be placed into your colon. To prevent C. diff infectionfrom spreading, wash hands often with soap and water. Do this for at least 20 seconds. Keep your home clean. This information is not intended to replace advice given to you by your health care provider. Make sure you discuss any questions you have with your healthcare provider. Document Revised: 09/16/2019 Document Reviewed: 09/16/2019 Elsevier Patient Education  2022 [place], MD [facility]

## 2021-01-25 NOTE — Patient Instructions (Signed)
Clostridioides Difficile Infection Clostridioides difficile infection, or C. diff, is an infection that is caused by C. diff germs (bacteria). This infection may happen after you take antibiotics that kill other germs and let C. diff germs grow. C. diff can be spread from person to person (is contagious). What are the causes? Taking certain antibiotics. Coming in contact with people, food, or things that have C. diff. What increases the risk? Taking certain antibiotics for a long time. Staying in a hospital or long-term care facility for a long time. Being age 70 or older. Having had C. diff before or been exposed to C. diff. Having a weak disease-fighting system (immune system). Taking medicines that treat stomach acid. Having serious health problems, including: Colon cancer. Inflammatory bowel disease (IBD). Having had a procedure or surgery on your digestive system. What are the signs or symptoms? Watery poop (diarrhea). Fever. Not feeling hungry. Feeling like you may vomit. Swelling, pain, cramps, or a tender belly. How is this treated? Treatment may include: Stopping the antibiotics that caused the C. diff infection. Taking antibiotics that kill C. diff. Placing poop from a healthy person into your colon (fecal transplant). Doing surgery to take out the infected part of the colon. Follow these instructions at home: Medicines Take over-the-counter and prescription medicines only as told by your doctor. Take antibiotic medicine as told by your doctor. Do not stop taking it even if you start to feel better. Do not take medicines to treat watery poop unless your doctor tells you to. Eating and drinking  Follow instructions from your doctor about what to eat and drink. This may include eating bland foods in small amounts, such as: Bananas. Applesauce. Rice. Lean meats. Toast. Crackers. To prevent loss of fluid in your body (dehydration): Take in enough fluids to keep your  pee pale yellow. This includes water, ice chips, clear fruit juice with water added to it, or low-calorie sports drinks. Take an ORS (oral rehydration solution). This drink is sold in pharmacies and retail stores. Avoid milk, caffeine, and alcohol.  General instructions Wash your hands often with soap and water. Do this for at least 20 seconds. Take a bath or shower every day. Return to your normal activities when your doctor says that it is safe. Keep all follow-up visits. How is this prevented? Personal hygiene  Wash your hands often with soap and water. Do this for at least 20 seconds. Wash your hands before you cook and after you use the bathroom. Other people should wash their hands too, especially: People who live with you. People who visit you in a hospital or clinic.  Contact precautions If you get watery poop while you are in the hospital or a long-term care facility, tell your doctor right away. When you visit someone in the hospital or a long-term care facility, wear a gown, gloves, or other protection. If possible: Stay away from people who have diarrhea. Use a separate bathroom if you are sick and live with other people. Clean environment Keep your home clean. Clean your home every day for at least a week after you leave the hospital. Clean surfaces that you touch every day. Use a product that has a 10% chlorine bleach solution. Be sure to: Read the label on your product to make sure that the product will kill the germs on your surfaces. Clean toilets and flush handles, bathtubs, sinks, doorknobs and handles, countertops, and work surfaces. If you are in the hospital, make sure the surfaces in your  room are cleaned each day. Tell someone right away if body fluids have splashed or spilled. Clothes and linens Wash clothes and linens using laundry soap that has chlorine bleach. Be sure to: Use powder soap instead of liquid. Clean your washing machine once a month. To do  this, turn on the hot setting with only soap in it. Contact a doctor if: Your symptoms do not get better or they get worse. Your symptoms go away and then come back. You have a fever. You have new symptoms. Get help right away if: Your belly is more tender or you have more pain. Your poop is mostly bloody. Your poop looks black. You vomit after you eat or drink. You have signs of not having enough fluids in your body. These include: Dark yellow pee, very little pee, or no pee. Cracked lips or dry mouth. No tears when you cry. Sunken eyes. Feeling sleepy. Feeling weak or dizzy. Summary C. diff infection is an infection that may happen after you take antibiotic medicines. Symptoms include watery poop, fever, not feeling hungry, or feeling like you may vomit. Treatment includes stopping the antibiotics that made you sick and taking antibiotics that kill the C. diff germs. Poop from a healthy person may also be placed into your colon. To prevent C. diff infectionfrom spreading, wash hands often with soap and water. Do this for at least 20 seconds. Keep your home clean. This information is not intended to replace advice given to you by your health care provider. Make sure you discuss any questions you have with your healthcare provider. Document Revised: 09/16/2019 Document Reviewed: 09/16/2019 Elsevier Patient Education  [place].

## 2021-01-25 NOTE — Assessment & Plan Note (Signed)
Foley catheter in place.  Draining well.  Urgent urology referral placed on 01/23/2021.

## 2021-01-25 NOTE — Assessment & Plan Note (Signed)
Need to continue Augmentin twice a day to complete 10-day course of treatment.

## 2021-01-25 NOTE — Assessment & Plan Note (Signed)
Diarrhea still ongoing.  Clinically stable.  Afebrile with normal vital signs and benign abdominal examination.  Was just started on vancomycin 125 mg 3 times a day. Advised to stay well-hydrated and avoid antidiarrheal agents as much as possible. ED precautions given.

## 2021-01-26 ENCOUNTER — Emergency Department (HOSPITAL_BASED_OUTPATIENT_CLINIC_OR_DEPARTMENT_OTHER)
Admission: EM | Admit: 2021-01-26 | Discharge: 2021-01-26 | Disposition: A | Discharge status: Home / Self Care | Payer: Medicare Other | Attending: Emergency Medicine | Admitting: Emergency Medicine

## 2021-01-26 ENCOUNTER — Telehealth: Payer: Self-pay

## 2021-01-26 ENCOUNTER — Encounter (HOSPITAL_BASED_OUTPATIENT_CLINIC_OR_DEPARTMENT_OTHER): Payer: Self-pay | Admitting: *Deleted

## 2021-01-26 DIAGNOSIS — E039 Hypothyroidism, unspecified: Secondary | ICD-10-CM | POA: Diagnosis not present

## 2021-01-26 DIAGNOSIS — I10 Essential (primary) hypertension: Secondary | ICD-10-CM | POA: Diagnosis not present

## 2021-01-26 DIAGNOSIS — Z79899 Other long term (current) drug therapy: Secondary | ICD-10-CM | POA: Diagnosis not present

## 2021-01-26 DIAGNOSIS — Z85828 Personal history of other malignant neoplasm of skin: Secondary | ICD-10-CM | POA: Insufficient documentation

## 2021-01-26 DIAGNOSIS — Z96652 Presence of left artificial knee joint: Secondary | ICD-10-CM | POA: Insufficient documentation

## 2021-01-26 DIAGNOSIS — T83098A Other mechanical complication of other indwelling urethral catheter, initial encounter: Secondary | ICD-10-CM | POA: Insufficient documentation

## 2021-01-26 DIAGNOSIS — T839XXA Unspecified complication of genitourinary prosthetic device, implant and graft, initial encounter: Secondary | ICD-10-CM

## 2021-01-26 DIAGNOSIS — Y846 Urinary catheterization as the cause of abnormal reaction of the patient, or of later complication, without mention of misadventure at the time of the procedure: Secondary | ICD-10-CM | POA: Diagnosis not present

## 2021-01-26 LAB — URINE CULTURE: Result:: NO GROWTH

## 2021-01-26 NOTE — ED Notes (Signed)
Foley cath leaking, checked amt of saline in balloon, only 4 cc, re inflated with proper amount of 10 cc. No leaking noted. Pt is drinking water and waiting to make sure no leakage after inflation. Dr Gilford Raid has seen pt and evaluated. Pt states he does not want treatment for his diarrhea as he is already seeing his PCP for this.

## 2021-01-26 NOTE — Telephone Encounter (Signed)
Patient spouse requesting to be seen today due to a catheter leaking. Patient advised to seek medical attention via UC or ED.    Follow-up apt schedule as well.

## 2021-01-26 NOTE — ED Provider Notes (Signed)
[facility] EMERGENCY DEPT Provider Note   CSN: [id number] Arrival date & time: 01/26/21  1032     History Chief Complaint  Patient presents with  . Catheter Leaking  . Diarrhea    Nathan Reilly is a 80 y.o. male.  Pt presents to the ED today with his foley catheter leaking.  The pt has an indwelling catheter.  The pt noticed that it was leaking today at 0300.  The pt is on abx for a UTI and is having some diarrhea.  He does not want to be seen for the diarrhea.  He just wants his foley fixed.  No f/c.  He otherwise feels well.      Past Medical History:  Diagnosis Date  . Arthritis   . Cancer (HCC)    HX SKIN CANCER  . Enlarged prostate   . GERD (gastroesophageal reflux disease)    OCCASIONAL  . Hay fever   . History of skin cancer   . Hypertension   . Scarlet fever    IN CHILDHOOD  . Temporomandibular joint disorders, unspecified     Patient Active Problem List   Diagnosis Date Noted  . C. difficile colitis 01/25/2021  . Diarrhea of infectious origin 01/25/2021  . Recent urinary tract infection 01/25/2021  . BPH with urinary obstruction 01/25/2021  . Enlarged prostate 01/11/2021  . Hypothyroidism 05/14/2017  . Essential hypertension 07/17/2007  . Hx of colonic polyps 07/17/2007    Past Surgical History:  Procedure Laterality Date  . BAND HEMORRHOIDECTOMY  2015   Dr Earlean Shawl  . POLYPECTOMY  2014   Dr Earlean Shawl  . TONSILLECTOMY    . 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 Diabetes Father   . Breast cancer Sister   . Hypertension Neg Hx   . Heart disease Neg Hx   . Stroke Neg Hx     Social History   Tobacco Use  . Smoking status: Never  . Smokeless tobacco: Never  Substance Use Topics  . Alcohol use: Yes    Alcohol/week: 6.0 standard drinks     Types: 6 Standard drinks or equivalent per week    Comment: OCCASIONAL  . Drug use: No    Home Medications Prior to Admission medications   Medication Sig Start Date End Date Taking? Authorizing Provider  doxazosin (CARDURA) 4 MG tablet TAKE 1 TABLET BY MOUTH EVERY DAY 03/01/20  Yes Hoyt Koch, MD  levothyroxine (SYNTHROID) 50 MCG tablet TAKE 1 TABLET BY MOUTH EVERY DAY 01/24/21  Yes Hoyt Koch, MD  lisinopril (ZESTRIL) 10 MG tablet TAKE 1 TABLET BY MOUTH EVERY DAY 01/24/21  Yes Hoyt Koch, MD  Polyethyl Glycol-Propyl Glycol 0.4-0.3 % GEL Apply 1 drop to eye daily.   Yes [provider]  sildenafil (REVATIO) 20 MG tablet Take 1-3 tablets (20-60 mg total) by mouth 3 (three) times daily as needed. 11/15/15  Yes Hoyt Koch, MD  fluticasone Asencion Islam) 50 MCG/ACT nasal spray Place 2 sprays into both nostrils daily. Annual appt due in June must see provider for future refills 12/10/17   Hoyt Koch, MD    Allergies    Patient has no known allergies.  Review of Systems   Review of Systems  Genitourinary:  Foley catheter leaking  All other systems reviewed and are negative.  Physical Exam Updated Vital Signs BP 133/76 (BP Location: Right Arm)   Pulse 67   Temp 98.3 F (36.8 C) (Oral)   Resp 17   Ht '5\' 9"'$  (1.753 m)   Wt 73.5 kg   SpO2 97%   BMI 23.92 kg/m   Physical Exam Vitals and nursing note reviewed.  Constitutional:      Appearance: Normal appearance.  HENT:     Head: Normocephalic and atraumatic.     Right Ear: External ear normal.     Left Ear: External ear normal.     Nose: Nose normal.     Mouth/Throat:     Mouth: Mucous membranes are moist.     Pharynx: Oropharynx is clear.  Eyes:     Extraocular Movements: Extraocular movements intact.     Conjunctiva/sclera: Conjunctivae normal.     Pupils: Pupils are equal, round, and reactive to light.  Cardiovascular:     Rate and Rhythm: Normal rate and regular  rhythm.     Pulses: Normal pulses.     Heart sounds: Normal heart sounds.  Pulmonary:     Effort: Pulmonary effort is normal.     Breath sounds: Normal breath sounds.  Abdominal:     General: Abdomen is flat. Bowel sounds are normal.     Palpations: Abdomen is soft.  Musculoskeletal:        General: Normal range of motion.     Cervical back: Normal range of motion and neck supple.  Skin:    General: Skin is warm.     Capillary Refill: Capillary refill takes less than 2 seconds.  Neurological:     General: No focal deficit present.     Mental Status: He is alert and oriented to person, place, and time.  Psychiatric:        Mood and Affect: Mood normal.        Behavior: Behavior normal.        Thought Content: Thought content normal.        Judgment: Judgment normal.    ED Results / Procedures / Treatments   Labs (all labs ordered are listed, but only abnormal results are displayed) Labs Reviewed - No data to display  EKG None  Radiology No results found.  Procedures Procedures   Medications Ordered in ED Medications - No data to display  ED Course  I have reviewed the triage vital signs and the nursing notes.  Pertinent labs & imaging results that were available during my care of the patient were reviewed by me and considered in my medical decision making (see chart for details).    MDM Rules/Calculators/A&P                           Pt's nurse noted there was only 4 cc of fluid in the [facility] balloon.  She inflated with the proper amt of 10 cc.  The pt was able to drink and urinate and is no longer having any leaking.  He is stable for d/c.  Return if worse. Final Clinical Impression(s) / ED Diagnoses Final diagnoses:  Problem with Foley catheter, initial encounter [facility])    Rx / [place] Orders ED Discharge Orders     None        Isla Pence, MD 01/26/21 1231

## 2021-01-26 NOTE — ED Triage Notes (Signed)
Catheter started to leak this morning about 0300. Pt is on antibiotics for UTI resulting in diarrhea.

## 2021-01-26 NOTE — ED Notes (Signed)
Pt able to drink water, waited to see if leakage occurred, after it did not Dr Gilford Raid aware and pt discharged.

## 2021-02-01 ENCOUNTER — Ambulatory Visit (INDEPENDENT_AMBULATORY_CARE_PROVIDER_SITE_OTHER): Payer: Medicare Other | Admitting: Internal Medicine

## 2021-02-01 ENCOUNTER — Other Ambulatory Visit: Payer: Self-pay

## 2021-02-01 ENCOUNTER — Encounter: Payer: Self-pay | Admitting: Internal Medicine

## 2021-02-01 DIAGNOSIS — N400 Enlarged prostate without lower urinary tract symptoms: Secondary | ICD-10-CM | POA: Diagnosis not present

## 2021-02-01 DIAGNOSIS — Z87440 Personal history of urinary (tract) infections: Secondary | ICD-10-CM

## 2021-02-01 DIAGNOSIS — A0472 Enterocolitis due to Clostridium difficile, not specified as recurrent: Secondary | ICD-10-CM | POA: Diagnosis not present

## 2021-02-01 NOTE — Progress Notes (Signed)
   Subjective:   Patient ID: Nathan Reilly, male    DOB: 01/17/1941, 80 y.o.   MRN: [id number]  HPI The patient is an 80 YO man coming in for follow up. [place]. Got C dif from antibiotics while admitted Discharged on amoxicillin (last dose yesterday) and vancomycin oral (still taking). He has a visit with urology next week as he was discharged with a catheter. He does take doxazosin chronically and still taking. Has had enlarged prostate for some time (last PSA <3 this year). Is eating okay and drinking plenty of fluids. Bowels are improving not normal yet (still maybe 4-5 loose stool per day down from 8-10).   Review of Systems  Constitutional:  Positive for activity change.  HENT: Negative.    Eyes: Negative.   Respiratory:  Negative for cough, chest tightness and shortness of breath.   Cardiovascular:  Negative for chest pain, palpitations and leg swelling.  Gastrointestinal:  Negative for abdominal distention, abdominal pain, constipation, diarrhea, nausea and vomiting.  Musculoskeletal: Negative.   Skin: Negative.   Neurological: Negative.   Psychiatric/Behavioral: Negative.     Objective:  Physical Exam Constitutional:      Appearance: He is well-developed.  HENT:     Head: Normocephalic and atraumatic.  Cardiovascular:     Rate and Rhythm: Normal rate and regular rhythm.  Pulmonary:     Effort: Pulmonary effort is normal. No respiratory distress.     Breath sounds: Normal breath sounds. No wheezing or rales.  Abdominal:     General: Bowel sounds are normal. There is no distension.     Palpations: Abdomen is soft.     Tenderness: There is no abdominal tenderness. There is no rebound.  Musculoskeletal:     Cervical back: Normal range of motion.  Skin:    General: Skin is warm and dry.  Neurological:     Mental Status: He is alert and oriented to person, place, and time.     Coordination: Coordination normal.    Vitals:    02/01/21 1031  BP: 104/80  Pulse: (!) 52  Resp: 18  Temp: 98.1 F (36.7 C)  TempSrc: Oral  SpO2: 97%  Weight: 162 lb 9.6 oz (73.8 kg)  Height: '5\' 9"'$  (1.753 m)    This visit occurred during the SARS-CoV-2 public health emergency.  Safety protocols were in place, including screening questions prior to the visit, additional usage of staff PPE, and extensive cleaning of exam room while observing appropriate contact time as indicated for disinfecting solutions.   Assessment & Plan:

## 2021-02-01 NOTE — Patient Instructions (Addendum)
Okay to start back taking benefiber.   We will have you finish the medicine for the diarrhea.  It is okay to start doing some yogurt or probiotic.   It may take several more weeks for the bowels to go back to normal.  Make sure to be cautious about antibiotics in the next year or two as you can be at risk for getting C dif in the future so tell any doctor prescribing an antibiotic that you have had this in case they do not know.

## 2021-02-02 NOTE — Assessment & Plan Note (Addendum)
Overall sounds to be improving. He would like to resume his normal daily fiber which is okay and expand diet as tolerated. No restrictions (he does not drink much caffeine). Advised to avoid or limit dairy initially. Counseled to be cautious about taking antibiotics and informing providers about his history of C dif as this can be recurrent.

## 2021-02-02 NOTE — Assessment & Plan Note (Signed)
He has finished antibiotic and recent urine culture done without growth.

## 2021-02-02 NOTE — Assessment & Plan Note (Signed)
Referral done to urology when he called and he will be seeing them next week. I advised him to expect to do a voiding trial (as he will be off antibiotics for 1 week, urine culture repeated and no growth). If unable to pass void trial to expect catheter until definitive treatment can be performed.

## 2021-02-04 ENCOUNTER — Other Ambulatory Visit: Payer: Self-pay

## 2021-02-04 ENCOUNTER — Emergency Department (HOSPITAL_BASED_OUTPATIENT_CLINIC_OR_DEPARTMENT_OTHER)
Admission: EM | Admit: 2021-02-04 | Discharge: 2021-02-04 | Disposition: A | Discharge status: Home / Self Care | Payer: Medicare Other | Attending: Emergency Medicine | Admitting: Emergency Medicine

## 2021-02-04 ENCOUNTER — Encounter (HOSPITAL_BASED_OUTPATIENT_CLINIC_OR_DEPARTMENT_OTHER): Payer: Self-pay | Admitting: Emergency Medicine

## 2021-02-04 DIAGNOSIS — Z8619 Personal history of other infectious and parasitic diseases: Secondary | ICD-10-CM | POA: Insufficient documentation

## 2021-02-04 DIAGNOSIS — T83091A Other mechanical complication of indwelling urethral catheter, initial encounter: Secondary | ICD-10-CM | POA: Diagnosis not present

## 2021-02-04 DIAGNOSIS — Z466 Encounter for fitting and adjustment of urinary device: Secondary | ICD-10-CM | POA: Diagnosis not present

## 2021-02-04 DIAGNOSIS — I10 Essential (primary) hypertension: Secondary | ICD-10-CM | POA: Insufficient documentation

## 2021-02-04 DIAGNOSIS — Z79899 Other long term (current) drug therapy: Secondary | ICD-10-CM | POA: Insufficient documentation

## 2021-02-04 DIAGNOSIS — Y69 Unspecified misadventure during surgical and medical care: Secondary | ICD-10-CM | POA: Diagnosis not present

## 2021-02-04 DIAGNOSIS — R339 Retention of urine, unspecified: Secondary | ICD-10-CM | POA: Diagnosis not present

## 2021-02-04 DIAGNOSIS — Z85828 Personal history of other malignant neoplasm of skin: Secondary | ICD-10-CM | POA: Insufficient documentation

## 2021-02-04 DIAGNOSIS — E039 Hypothyroidism, unspecified: Secondary | ICD-10-CM | POA: Diagnosis not present

## 2021-02-04 DIAGNOSIS — Z978 Presence of other specified devices: Secondary | ICD-10-CM

## 2021-02-04 DIAGNOSIS — N401 Enlarged prostate with lower urinary tract symptoms: Secondary | ICD-10-CM | POA: Insufficient documentation

## 2021-02-04 NOTE — ED Triage Notes (Addendum)
Pt has had indwelling catheter post UTI and prostate issues since August 15.  Pt believes catheter to be partially obstructed now and had a sore spot on the tip of his penis.

## 2021-02-04 NOTE — Discharge Instructions (Addendum)
Keep your appointment with urology on Tuesday.  Discuss long-term options at that time.  Return to the ED for any new concerning symptoms.

## 2021-02-04 NOTE — ED Provider Notes (Signed)
[place] EMERGENCY DEPT Provider Note   CSN: [id number] Arrival date & time: 02/04/21  [id number]     History Chief Complaint  Patient presents with   Urinary Retention    Catheter complication    Nathan Reilly is a 80 y.o. male.  HPI Patient is 80 year old male with history of BPH, and urinary retention, currently with indwelling Foley catheter, presenting for catheter problem.  Recent history notable for urosepsis requiring hospitalization and subsequent C. difficile infection.  Treatment for C. difficile started on 8/18.  He was seen in the ED the following day for leakage around his catheter.  He was found to have 4 cc of fluid in the balloon at that time.  Balloon was inflated with 10 cc with resolution of leakage.  Today, he is concerned about a recent crusting he noticed on the catheter tube itself.  He denies any recent fevers, chills, nausea, changes in urine output, changes in urine color.  He has been drinking lots of fluids.  He states that the catheter has been in place for 2 weeks now.  He has not repositioned his bag.  Bag is kept on the right leg.    Past Medical History:  Diagnosis Date   Arthritis    Cancer ([place])    HX SKIN CANCER   Enlarged prostate    GERD (gastroesophageal reflux disease)    OCCASIONAL   Hay fever    History of skin cancer    Hypertension    Scarlet fever    IN CHILDHOOD   Temporomandibular joint disorders, unspecified     Patient Active Problem List   Diagnosis Date Noted   C. 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   Enlarged prostate 01/11/2021   Hypothyroidism 05/14/2017   Essential hypertension 07/17/2007   Hx of colonic polyps 07/17/2007    Past Surgical History:  Procedure Laterality Date   BAND HEMORRHOIDECTOMY  2015   Dr Earlean Shawl   POLYPECTOMY  2014   Dr Earlean Shawl   TONSILLECTOMY     TOTAL  KNEE ARTHROPLASTY Left 07/26/2014   Procedure: LEFT TOTAL KNEE ARTHROPLASTY;  Surgeon: Mauri Pole, MD;  Location: [facility];  Service: Orthopedics;  Laterality: Left;       Family History  Problem Relation Age of Onset   Diabetes Father    Breast cancer Sister    Hypertension Neg Hx    Heart disease Neg Hx    Stroke Neg Hx     Social History   Tobacco Use   Smoking status: Never   Smokeless tobacco: Never  Substance Use Topics   Alcohol use: Yes    Alcohol/week: 6.0 standard drinks    Types: 6 Standard drinks or equivalent per week    Comment: OCCASIONAL   Drug use: No    Home Medications Prior to Admission medications   Medication Sig Start Date End Date Taking? Authorizing Provider  doxazosin (CARDURA) 4 MG tablet TAKE 1 TABLET BY MOUTH EVERY DAY 03/01/20  Yes Hoyt Koch, MD  levothyroxine (SYNTHROID) 50 MCG tablet TAKE 1 TABLET BY MOUTH EVERY DAY 01/24/21  Yes Hoyt Koch, MD  lisinopril (ZESTRIL) 10 MG tablet TAKE 1 TABLET BY MOUTH EVERY DAY 01/24/21  Yes Hoyt Koch, MD  vancomycin (VANCOCIN) 125 MG capsule Take 125 mg by mouth 4 (four) times daily.   Yes [provider]  fluticasone (  FLONASE) 50 MCG/ACT nasal spray Place 2 sprays into both nostrils daily. Annual appt due in June must see provider for future refills 12/10/17   Hoyt Koch, MD  Polyethyl Glycol-Propyl Glycol 0.4-0.3 % GEL Apply 1 drop to eye daily.    [provider]  Probiotic Product (PROBIOTIC DAILY PO) Take 1 tablet by mouth daily.    [provider]  sildenafil (REVATIO) 20 MG tablet Take 1-3 tablets (20-60 mg total) by mouth 3 (three) times daily as needed. Patient not taking: Reported on 02/01/2021 11/15/15   Hoyt Koch, MD    Allergies    Patient has no known allergies.  Review of Systems   Review of Systems  Constitutional:  Negative for activity change, appetite change, chills, fatigue and fever.  HENT:  Negative for  ear pain and sore throat.   Eyes:  Negative for pain and visual disturbance.  Respiratory:  Negative for cough and shortness of breath.   Cardiovascular:  Negative for chest pain and palpitations.  Gastrointestinal:  Negative for abdominal pain and vomiting.  Genitourinary:  Negative for decreased urine volume, dysuria, flank pain, hematuria, penile pain, penile swelling and testicular pain.  Musculoskeletal:  Negative for arthralgias, myalgias and neck pain.  Skin:  Negative for color change and rash.  Neurological:  Negative for seizures and syncope.  All other systems reviewed and are negative.  Physical Exam Updated Vital Signs BP (!) 157/96 (BP Location: Right Arm)   Pulse 64   Temp 98 F (36.7 C) (Oral)   Resp 18   Wt 73.9 kg   SpO2 98%   BMI 24.07 kg/m   Physical Exam Vitals and nursing note reviewed.  Constitutional:      General: He is not in acute distress.    Appearance: Normal appearance. He is well-developed and normal weight. He is not ill-appearing, toxic-appearing or diaphoretic.  HENT:     Head: Normocephalic and atraumatic.     Right Ear: External ear normal.     Left Ear: External ear normal.     Nose: Nose normal.     Mouth/Throat:     Mouth: Mucous membranes are moist.     Pharynx: Oropharynx is clear.  Eyes:     Conjunctiva/sclera: Conjunctivae normal.  Cardiovascular:     Rate and Rhythm: Normal rate and regular rhythm.     Heart sounds: No murmur heard. Pulmonary:     Effort: Pulmonary effort is normal. No respiratory distress.     Breath sounds: Normal breath sounds.  Abdominal:     Palpations: Abdomen is soft.     Tenderness: There is no abdominal tenderness.  Genitourinary:    Comments: Foley catheter is in place.  Drainage bag on right upper leg.  There is a small crusting on the Foley catheter tube, 1 cm distal to the meatus.  No evidence of skin breakdown, erythema, tenderness, or purulence. Musculoskeletal:        General: Normal range  of motion.     Cervical back: Neck supple.     Right lower leg: No edema.     Left lower leg: No edema.  Skin:    General: Skin is warm and dry.  Neurological:     General: No focal deficit present.     Mental Status: He is alert and oriented to person, place, and time.     Cranial Nerves: No cranial nerve deficit.     Sensory: No sensory deficit.     Motor:  No weakness.  Psychiatric:        Mood and Affect: Mood normal.        Behavior: Behavior normal.        Thought Content: Thought content normal.        Judgment: Judgment normal.    ED Results / Procedures / Treatments   Labs (all labs ordered are listed, but only abnormal results are displayed) Labs Reviewed - No data to display  EKG None  Radiology No results found.  Procedures Procedures   Medications Ordered in ED Medications - No data to display  ED Course  I have reviewed the triage vital signs and the nursing notes.  Pertinent labs & imaging results that were available during my care of the patient were reviewed by me and considered in my medical decision making (see chart for details).    MDM Rules/Calculators/A&P                           Patient is a 80 year old male who has had a indwelling Foley in place for the past 2 weeks following a hospitalization for urosepsis.  Following this hospitalization, he did develop C. difficile.  He has finished his C. difficile treatment and has had resolution of his diarrhea.  He has not had any changes in the recent urinary function.  He is concerned about some crusting that he saw on his Foley catheter tube.  On exam, patient is well-appearing.  Vital signs are normal.  Foley catheter remains in place.  Drainage bag located on right leg.  Yellow urine present in drainage bag.  The tube circles in a counterclockwise direction and does appear to be pressing on the 12 o'clock position of his meatus.  Given that he has not repositioned it, patient is at risk for pressure  injury.  No external evidence of injury is present.  There is no associated erythema, tenderness, or purulence.  Patient does have a urology appointment in 2 days.  Foley bag was repositioned to the left leg.  Patient was counseled on offloading pressure periodically to avoid pressure injuries in the area of his meatus/urethra.  Patient was advised to keep his follow-up appointment with urology in 2 days to discuss long-term options for his urinary retention.  He was encouraged to return to the ED for any new symptoms.  He was discharged in good condition. Final Clinical Impression(s) / ED Diagnoses Final diagnoses:  Foley catheter in place    Rx / DC Orders ED Discharge Orders     None        Godfrey Pick, MD 02/04/21 [phone]

## 2021-02-06 DIAGNOSIS — R338 Other retention of urine: Secondary | ICD-10-CM | POA: Diagnosis not present

## 2021-02-06 DIAGNOSIS — N3000 Acute cystitis without hematuria: Secondary | ICD-10-CM | POA: Diagnosis not present

## 2021-02-07 DIAGNOSIS — R8271 Bacteriuria: Secondary | ICD-10-CM | POA: Diagnosis not present

## 2021-02-07 DIAGNOSIS — R338 Other retention of urine: Secondary | ICD-10-CM | POA: Diagnosis not present

## 2021-02-13 DIAGNOSIS — R338 Other retention of urine: Secondary | ICD-10-CM | POA: Diagnosis not present

## 2021-02-20 ENCOUNTER — Other Ambulatory Visit: Payer: Self-pay | Admitting: Internal Medicine

## 2021-02-20 DIAGNOSIS — R338 Other retention of urine: Secondary | ICD-10-CM | POA: Diagnosis not present

## 2021-02-20 DIAGNOSIS — R3915 Urgency of urination: Secondary | ICD-10-CM | POA: Diagnosis not present

## 2021-03-06 ENCOUNTER — Other Ambulatory Visit: Payer: Self-pay | Admitting: Urology

## 2021-03-20 DIAGNOSIS — R338 Other retention of urine: Secondary | ICD-10-CM | POA: Diagnosis not present

## 2021-03-26 ENCOUNTER — Encounter (HOSPITAL_BASED_OUTPATIENT_CLINIC_OR_DEPARTMENT_OTHER): Payer: Self-pay | Admitting: Urology

## 2021-03-26 ENCOUNTER — Other Ambulatory Visit: Payer: Self-pay | Admitting: Urology

## 2021-03-27 ENCOUNTER — Encounter (HOSPITAL_BASED_OUTPATIENT_CLINIC_OR_DEPARTMENT_OTHER): Payer: Self-pay | Admitting: Urology

## 2021-03-27 ENCOUNTER — Other Ambulatory Visit: Payer: Self-pay | Admitting: Urology

## 2021-03-27 ENCOUNTER — Other Ambulatory Visit: Payer: Self-pay

## 2021-03-27 NOTE — Progress Notes (Signed)
Spoke w/ via phone for pre-op interview---pt Lab needs dos----  [employer] results------ current ekg in epic/ chart COVID test -----patient states asymptomatic, getting covid test 03-27-2021 since he is overnight pt  Arrive at ------- 0945 on 03-30-2021 NPO after MN NO Solid Food.  Clear liquids from MN until--- 0845 Med rec completed Medications to take morning of surgery ----- synthroid, finasteride, doxazosin, trospium Diabetic medication ----- n/a Patient instructed no nail polish to be worn day of surgery Patient instructed to bring photo id and insurance card day of surgery Patient aware to have Driver (ride ) / caregiver for 24 hours after surgery --son, clay Juniel Patient Special Instructions ----- reviewed [facility] and visitor guidelines Pre-Op special Istructions -----  n/a Patient verbalized understanding of instructions that were given at this phone interview. Patient denies shortness of breath, chest pain, fever, cough at this phone interview.

## 2021-03-28 LAB — SARS CORONAVIRUS 2 (TAT 6-24 HRS): SARS Coronavirus 2: NEGATIVE

## 2021-03-30 ENCOUNTER — Encounter (HOSPITAL_BASED_OUTPATIENT_CLINIC_OR_DEPARTMENT_OTHER)
Admission: RE | Disposition: A | Discharge status: Home / Self Care | Payer: Self-pay | Source: Home / Self Care | Attending: Urology

## 2021-03-30 ENCOUNTER — Ambulatory Visit (HOSPITAL_BASED_OUTPATIENT_CLINIC_OR_DEPARTMENT_OTHER): Payer: Medicare Other | Admitting: Certified Registered"

## 2021-03-30 ENCOUNTER — Ambulatory Visit (HOSPITAL_BASED_OUTPATIENT_CLINIC_OR_DEPARTMENT_OTHER)
Admission: RE | Admit: 2021-03-30 | Discharge: 2021-03-31 | Disposition: A | Discharge status: Home / Self Care | Payer: Medicare Other | Attending: Urology | Admitting: Urology

## 2021-03-30 ENCOUNTER — Encounter (HOSPITAL_BASED_OUTPATIENT_CLINIC_OR_DEPARTMENT_OTHER): Payer: Self-pay | Admitting: Urology

## 2021-03-30 ENCOUNTER — Other Ambulatory Visit: Payer: Self-pay

## 2021-03-30 DIAGNOSIS — E785 Hyperlipidemia, unspecified: Secondary | ICD-10-CM | POA: Diagnosis not present

## 2021-03-30 DIAGNOSIS — Z79890 Hormone replacement therapy (postmenopausal): Secondary | ICD-10-CM | POA: Diagnosis not present

## 2021-03-30 DIAGNOSIS — D304 Benign neoplasm of urethra: Secondary | ICD-10-CM | POA: Diagnosis not present

## 2021-03-30 DIAGNOSIS — N368 Other specified disorders of urethra: Secondary | ICD-10-CM | POA: Diagnosis not present

## 2021-03-30 DIAGNOSIS — Z79899 Other long term (current) drug therapy: Secondary | ICD-10-CM | POA: Insufficient documentation

## 2021-03-30 DIAGNOSIS — N138 Other obstructive and reflux uropathy: Secondary | ICD-10-CM | POA: Diagnosis not present

## 2021-03-30 DIAGNOSIS — N320 Bladder-neck obstruction: Secondary | ICD-10-CM | POA: Diagnosis not present

## 2021-03-30 DIAGNOSIS — N401 Enlarged prostate with lower urinary tract symptoms: Secondary | ICD-10-CM | POA: Diagnosis present

## 2021-03-30 DIAGNOSIS — Z85828 Personal history of other malignant neoplasm of skin: Secondary | ICD-10-CM | POA: Insufficient documentation

## 2021-03-30 DIAGNOSIS — N369 Urethral disorder, unspecified: Secondary | ICD-10-CM | POA: Diagnosis not present

## 2021-03-30 DIAGNOSIS — Z20822 Contact with and (suspected) exposure to covid-19: Secondary | ICD-10-CM | POA: Diagnosis not present

## 2021-03-30 DIAGNOSIS — I10 Essential (primary) hypertension: Secondary | ICD-10-CM | POA: Diagnosis not present

## 2021-03-30 DIAGNOSIS — Z87440 Personal history of urinary (tract) infections: Secondary | ICD-10-CM | POA: Insufficient documentation

## 2021-03-30 DIAGNOSIS — R338 Other retention of urine: Secondary | ICD-10-CM | POA: Diagnosis not present

## 2021-03-30 DIAGNOSIS — E039 Hypothyroidism, unspecified: Secondary | ICD-10-CM | POA: Diagnosis not present

## 2021-03-30 HISTORY — DX: Presence of other specified devices: Z97.8

## 2021-03-30 HISTORY — DX: Benign prostatic hyperplasia without lower urinary tract symptoms: N40.0

## 2021-03-30 HISTORY — DX: Presence of spectacles and contact lenses: Z97.3

## 2021-03-30 HISTORY — PX: TRANSURETHRAL RESECTION OF PROSTATE: SHX73

## 2021-03-30 HISTORY — DX: Personal history of other malignant neoplasm of skin: Z85.828

## 2021-03-30 HISTORY — DX: Other obstructive and reflux uropathy: N13.8

## 2021-03-30 HISTORY — DX: Other obstructive and reflux uropathy: N40.1

## 2021-03-30 HISTORY — DX: Hypothyroidism, unspecified: E03.9

## 2021-03-30 LAB — BASIC METABOLIC PANEL
Anion gap: 5 (ref 5–15)
BUN: 19 mg/dL (ref 8–23)
CO2: 23 mmol/L (ref 22–32)
Calcium: 8.4 mg/dL — ABNORMAL LOW (ref 8.9–10.3)
Chloride: 107 mmol/L (ref 98–111)
Creatinine, Ser: 1.06 mg/dL (ref 0.61–1.24)
GFR, Estimated: >60 mL/min (ref >60)
Glucose, Bld: 103 mg/dL — ABNORMAL HIGH (ref 70–99)
Potassium: 5.3 mmol/L — ABNORMAL HIGH (ref 3.5–5.1)
Sodium: 135 mmol/L (ref 135–145)

## 2021-03-30 LAB — CBC
HCT: 40.2 % (ref 39.0–52.0)
Hemoglobin: 13.1 g/dL (ref 13.0–17.0)
MCH: 31.4 pg (ref 26.0–34.0)
MCHC: 32.6 g/dL (ref 30.0–36.0)
MCV: 96.4 fL (ref 80.0–100.0)
Platelets: 141 10*3/uL — ABNORMAL LOW (ref 150–400)
RBC: 4.17 MIL/uL — ABNORMAL LOW (ref 4.22–5.81)
RDW: 13.2 % (ref 11.5–15.5)
WBC: 6.1 10*3/uL (ref 4.0–10.5)
nRBC: 0 % (ref 0.0–0.2)

## 2021-03-30 LAB — POCT I-STAT, CHEM 8
BUN: 20 mg/dL (ref 8–23)
Calcium, Ion: 1.18 mmol/L (ref 1.15–1.40)
Chloride: 104 mmol/L (ref 98–111)
Creatinine, Ser: 1 mg/dL (ref 0.61–1.24)
Glucose, Bld: 102 mg/dL — ABNORMAL HIGH (ref 70–99)
HCT: 41 % (ref 39.0–52.0)
Hemoglobin: 13.9 g/dL (ref 13.0–17.0)
Potassium: 4 mmol/L (ref 3.5–5.1)
Sodium: 138 mmol/L (ref 135–145)
TCO2: 22 mmol/L (ref 22–32)

## 2021-03-30 SURGERY — TURP (TRANSURETHRAL RESECTION OF PROSTATE)
Anesthesia: General | Site: Prostate

## 2021-03-30 MED ORDER — LIDOCAINE 2% (20 MG/ML) 5 ML SYRINGE
INTRAMUSCULAR | Status: AC
  Filled 2021-03-30: qty 5

## 2021-03-30 MED ORDER — OXYCODONE-ACETAMINOPHEN 5-325 MG PO TABS: 1.0000 | ORAL_TABLET | ORAL | 0 refills | Status: DC | PRN

## 2021-03-30 MED ORDER — SODIUM CHLORIDE 0.9 % IR SOLN: 3000.0000 mL | Status: DC

## 2021-03-30 MED ORDER — DOCUSATE SODIUM 100 MG PO CAPS
ORAL_CAPSULE | ORAL | Status: AC
  Filled 2021-03-30: qty 1

## 2021-03-30 MED ORDER — DEXAMETHASONE SODIUM PHOSPHATE 10 MG/ML IJ SOLN
INTRAMUSCULAR | Status: DC | PRN
  Administered 2021-03-30: 5 mg via INTRAVENOUS

## 2021-03-30 MED ORDER — LISINOPRIL 10 MG PO TABS
10.0000 mg | ORAL_TABLET | Freq: Every day | ORAL | Status: DC
  Administered 2021-03-30: 10 mg via ORAL
  Filled 2021-03-30: qty 1

## 2021-03-30 MED ORDER — LIDOCAINE 2% (20 MG/ML) 5 ML SYRINGE
INTRAMUSCULAR | Status: DC | PRN
  Administered 2021-03-30: 40 mg via INTRAVENOUS

## 2021-03-30 MED ORDER — DOCUSATE SODIUM 100 MG PO CAPS: 100.0000 mg | ORAL_CAPSULE | Freq: Every day | ORAL | 0 refills | Status: DC | PRN

## 2021-03-30 MED ORDER — DOCUSATE SODIUM 100 MG PO CAPS
100.0000 mg | ORAL_CAPSULE | Freq: Two times a day (BID) | ORAL | Status: DC
  Administered 2021-03-30 (×2): 100 mg via ORAL

## 2021-03-30 MED ORDER — CEFAZOLIN SODIUM-DEXTROSE 2-4 GM/100ML-% IV SOLN
2.0000 g | Freq: Once | INTRAVENOUS | Status: AC
  Administered 2021-03-30: 2 g via INTRAVENOUS

## 2021-03-30 MED ORDER — HYDROMORPHONE HCL 1 MG/ML IJ SOLN: 0.5000 mg | INTRAMUSCULAR | Status: DC | PRN

## 2021-03-30 MED ORDER — ONDANSETRON HCL 4 MG/2ML IJ SOLN
INTRAMUSCULAR | Status: AC
  Filled 2021-03-30: qty 2

## 2021-03-30 MED ORDER — FENTANYL CITRATE (PF) 100 MCG/2ML IJ SOLN
INTRAMUSCULAR | Status: DC | PRN
  Administered 2021-03-30: 25 ug via INTRAVENOUS
  Administered 2021-03-30: 50 ug via INTRAVENOUS

## 2021-03-30 MED ORDER — PHENOL 1.4 % MT LIQD: 1.0000 | OROMUCOSAL | Status: DC | PRN

## 2021-03-30 MED ORDER — MENTHOL 3 MG MT LOZG
LOZENGE | OROMUCOSAL | Status: AC
  Filled 2021-03-30: qty 9

## 2021-03-30 MED ORDER — PROPOFOL 10 MG/ML IV BOLUS
INTRAVENOUS | Status: DC | PRN
Start: 2021-03-30 — End: 2021-03-30
  Administered 2021-03-30: 15 mg via INTRAVENOUS

## 2021-03-30 MED ORDER — BELLADONNA ALKALOIDS-OPIUM 16.2-60 MG RE SUPP
RECTAL | Status: DC | PRN
  Administered 2021-03-30: 1 via RECTAL

## 2021-03-30 MED ORDER — FLUTICASONE PROPIONATE 50 MCG/ACT NA SUSP
2.0000 | Freq: Every day | NASAL | Status: DC
  Filled 2021-03-30: qty 16

## 2021-03-30 MED ORDER — FENTANYL CITRATE (PF) 100 MCG/2ML IJ SOLN: 25.0000 ug | INTRAMUSCULAR | Status: DC | PRN

## 2021-03-30 MED ORDER — SENNOSIDES-DOCUSATE SODIUM 8.6-50 MG PO TABS
1.0000 | ORAL_TABLET | Freq: Every evening | ORAL | Status: DC | PRN
  Filled 2021-03-30: qty 1

## 2021-03-30 MED ORDER — CEFAZOLIN SODIUM-DEXTROSE 2-4 GM/100ML-% IV SOLN
INTRAVENOUS | Status: AC
  Filled 2021-03-30: qty 100

## 2021-03-30 MED ORDER — LACTATED RINGERS IV SOLN
INTRAVENOUS | Status: DC
  Administered 2021-03-30: 1000 mL via INTRAVENOUS

## 2021-03-30 MED ORDER — LEVOTHYROXINE SODIUM 50 MCG PO TABS
50.0000 ug | ORAL_TABLET | Freq: Every day | ORAL | Status: DC
  Administered 2021-03-31: 50 ug via ORAL
  Filled 2021-03-30: qty 1

## 2021-03-30 MED ORDER — ONDANSETRON HCL 4 MG/2ML IJ SOLN
INTRAMUSCULAR | Status: DC | PRN
  Administered 2021-03-30: 4 mg via INTRAVENOUS

## 2021-03-30 MED ORDER — PROPOFOL 10 MG/ML IV BOLUS
INTRAVENOUS | Status: AC
  Filled 2021-03-30: qty 20

## 2021-03-30 MED ORDER — DOXAZOSIN MESYLATE 4 MG PO TABS
4.0000 mg | ORAL_TABLET | Freq: Every day | ORAL | Status: DC
  Filled 2021-03-30: qty 1

## 2021-03-30 MED ORDER — FENTANYL CITRATE (PF) 100 MCG/2ML IJ SOLN
INTRAMUSCULAR | Status: AC
  Filled 2021-03-30: qty 2

## 2021-03-30 MED ORDER — KCL IN DEXTROSE-NACL 20-5-0.45 MEQ/L-%-% IV SOLN
INTRAVENOUS | Status: DC
  Administered 2021-03-30: 100 mL/h via INTRAVENOUS
  Filled 2021-03-30 (×2): qty 1000

## 2021-03-30 MED ORDER — BISACODYL 10 MG RE SUPP: 10.0000 mg | Freq: Every day | RECTAL | Status: DC | PRN

## 2021-03-30 MED ORDER — MENTHOL 3 MG MT LOZG
1.0000 | LOZENGE | OROMUCOSAL | Status: DC | PRN
  Administered 2021-03-30: 3 mg via ORAL

## 2021-03-30 MED ORDER — EPHEDRINE SULFATE-NACL 50-0.9 MG/10ML-% IV SOSY
PREFILLED_SYRINGE | INTRAVENOUS | Status: DC | PRN
  Administered 2021-03-30: 10 mg via INTRAVENOUS

## 2021-03-30 MED ORDER — ONDANSETRON HCL 4 MG/2ML IJ SOLN: 4.0000 mg | INTRAMUSCULAR | Status: DC | PRN

## 2021-03-30 MED ORDER — BELLADONNA ALKALOIDS-OPIUM 16.2-60 MG RE SUPP
RECTAL | Status: AC
  Filled 2021-03-30: qty 1

## 2021-03-30 MED ORDER — SODIUM CHLORIDE 0.9 % IR SOLN
Status: DC | PRN
  Administered 2021-03-30: 21420 mL via INTRAVESICAL

## 2021-03-30 MED ORDER — DEXAMETHASONE SODIUM PHOSPHATE 10 MG/ML IJ SOLN
INTRAMUSCULAR | Status: AC
  Filled 2021-03-30: qty 1

## 2021-03-30 MED ORDER — OXYCODONE-ACETAMINOPHEN 5-325 MG PO TABS: 1.0000 | ORAL_TABLET | ORAL | Status: DC | PRN

## 2021-03-30 MED ORDER — EPHEDRINE 5 MG/ML INJ
INTRAVENOUS | Status: AC
  Filled 2021-03-30: qty 5

## 2021-03-30 MED ORDER — ACETAMINOPHEN 325 MG PO TABS: 650.0000 mg | ORAL_TABLET | ORAL | Status: DC | PRN

## 2021-03-30 SURGICAL SUPPLY — 27 items
BAG DRAIN URO-CYSTO SKYTR STRL (DRAIN) ×2 IMPLANT
BAG DRN RND TRDRP ANRFLXCHMBR (UROLOGICAL SUPPLIES) ×1
BAG DRN UROCATH (DRAIN) ×1
BAG URINE DRAIN 2000ML AR STRL (UROLOGICAL SUPPLIES) ×2 IMPLANT
BAG URINE LEG 500ML (DRAIN) IMPLANT
CATH FOLEY 2WAY SLVR 30CC 20FR (CATHETERS) IMPLANT
CATH FOLEY 3WAY 30CC 22FR (CATHETERS) ×3 IMPLANT
CLOTH BEACON ORANGE TIMEOUT ST (SAFETY) ×2 IMPLANT
ELECT BIVAP BIPO 22/24 DONUT (ELECTROSURGICAL)
ELECT REM PT RETURN 9FT ADLT (ELECTROSURGICAL)
ELECTRD BIVAP BIPO 22/24 DONUT (ELECTROSURGICAL) IMPLANT
ELECTRODE REM PT RTRN 9FT ADLT (ELECTROSURGICAL) IMPLANT
GLOVE SURG ENC MOIS LTX SZ7 (GLOVE) ×2 IMPLANT
GOWN STRL REUS W/TWL LRG LVL3 (GOWN DISPOSABLE) ×2 IMPLANT
HOLDER FOLEY CATH W/STRAP (MISCELLANEOUS) ×2 IMPLANT
IV NS IRRIG 3000ML ARTHROMATIC (IV SOLUTION) ×13 IMPLANT
KIT TURNOVER CYSTO (KITS) ×2 IMPLANT
LOOP CUT BIPOLAR 24F LRG (ELECTROSURGICAL) ×2 IMPLANT
MANIFOLD NEPTUNE II (INSTRUMENTS) ×2 IMPLANT
NS IRRIG 500ML POUR BTL (IV SOLUTION) ×1 IMPLANT
PACK CYSTO (CUSTOM PROCEDURE TRAY) ×2 IMPLANT
SYR 30ML LL (SYRINGE) ×2 IMPLANT
SYR TOOMEY IRRIG 70ML (MISCELLANEOUS) ×2
SYRINGE TOOMEY IRRIG 70ML (MISCELLANEOUS) ×1 IMPLANT
TUBE CONNECTING 12X1/4 (SUCTIONS) ×2 IMPLANT
TUBING UROLOGY SET (TUBING) ×2 IMPLANT
WATER STERILE IRR 500ML POUR (IV SOLUTION) ×1 IMPLANT

## 2021-03-30 NOTE — Anesthesia Procedure Notes (Addendum)
Procedure Name: LMA Insertion Date/Time: 03/30/2021 11:35 AM Performed by: Bonney Aid, CRNA Pre-anesthesia Checklist: Patient identified, Emergency Drugs available, Suction available and Patient being monitored Patient Re-evaluated:Patient Re-evaluated prior to induction Oxygen Delivery Method: Circle system utilized Preoxygenation: Pre-oxygenation with 100% oxygen Induction Type: IV induction Ventilation: Mask ventilation without difficulty LMA: LMA inserted LMA Size: 4.0 Number of attempts: 1 Airway Equipment and Method: Bite block Placement Confirmation: positive ETCO2 Tube secured with: Tape Dental Injury: Teeth and Oropharynx as per pre-operative assessment

## 2021-03-30 NOTE — Discharge Instructions (Signed)

## 2021-03-30 NOTE — Op Note (Signed)
Operative Note  Preoperative diagnosis:  1.  BPH with bladder outlet obstruction 2. Urethral lesion  Postoperative diagnosis: 1.  Urethral lesion 2. BPH with bladder outlet obstruction  Procedure(s): Biopsy of bulbar urethral lesion 2.  Bipolar transurethral resection of prostate  Surgeon: Rexene Alberts, MD  Assistants:  None  Anesthesia:  General  Complications:  None  EBL:  Minimal  Specimens: ID Type Source Tests Collected by Time Destination  1 :  bulbar urethral lesion Tissue PATH Other SURGICAL PATHOLOGY Janith Lima, MD 03/30/2021 1215   2 : prostate chips Tissue PATH Prostate TURP SURGICAL PATHOLOGY Janith Lima, MD 03/30/2021 1217     Drains/Catheters: 1.  22Fr 3 way catheter with 80ml water into balloon  Intraoperative findings:   1cm papillary appearing polyp on the left lateral aspect in the distal bulbar urethra which was biopsied with a cold cup rigid forcep.  The lesion was removed in entirety with this 1 biopsy. Next, he had a trilobar obstructing prostate.  Excellent hemostasis and excellent wide open prostatic fossa at the end of the case. There is some mild erythema consistent with chronic irritation from Foley catheter on the left posterior aspect of his bladder.  Indication:  Nathan Reilly is a 80 y.o. male with BPH with bladder outlet obstruction presenting for transurethral resection of the prostate as well as biopsy of a urethral lesion. He had a TRUS volume of 61cm^3.  He has been in urinary retention requiring Foley catheter. After thorough discussion including all relevant risk benefits and alternatives, he presents today for a bipolar TURP.  Description of procedure: The indication, alternatives, benefits and risks were discussed with the patient and informed consent was obtained.  Patient was brought to the operating room table, positioned supine, secured with a safety strap.  Pneumatic compression devices were placed on the lower extremities.   After the administration of intravenous antibiotics and general anesthesia, the patient was repositioned into the dorsal lithotomy position.  All pressure points were carefully padded.  A rectal examination was performed confirming a smooth symmetric enlarged gland.  The genitalia were prepped and draped in standard sterile manner.  A timeout was completed, verifying the correct patient, surgical procedure and positioning prior to beginning the procedure.  Isotonic sodium chloride was used for irrigation.  A 21 French cystoscope with a rigid forcep was introduced and we examined the bulbar urethra lesion as above.  1 biopsy was taken with removal of the entire lesion.  There is only minimal ooze at this location that was found to be hemostatic at the end of the case.  A 26 French continuous-flow resectoscope sheath with the visual obturator and a 30 degree lens was advanced under direct vision into the bladder.  The prostatic urethra was elongated with trilobar hyperplasia.  On cystoscopic evaluation, his bladder capacity appeared normal, the bladder wall was noted to expand symmetrically in all dimensions.  There were no tumors, stones or foreign bodies present. The bladder was trabeculated with normal-appearing mucosa other than mild erythema in the posterior left aspect of the bladder consistent with chronic irritation from prior Foley catheter placement. Both ureteral orifices were in their normal anatomic positions with clear urinary reflux noted bilaterally.  The obturator was removed and replaced by the working element with a resection loop.  The location of the ureteral orifices and the prostatic configuration were again confirmed.  Starting at the bladder neck and proceeding distally to the verumontanum a transurethral section of the prostate  was performed using bipolar using energy of 4 and 5 for cutting and coagulation, respectively.  The procedure began at the bladder neck at the 5 o'clock and 7  o'clock positions and carefully carried distally to the verumontanum, resecting the intervening prostatic adenoma.  Next the left lateral lobe was resected to the level of the transverse capsular fibers.  The identical procedure was performed on the right lobe.  Attention was then directed anteriorly and the resection was completed from the 10 o'clock to 2 o'clock positions.  All bleeding vessels were fulgurated achieving meticulous hemostasis.  The bladder was irrigated with a Toomey syringe, ensuring removal of all prostate chips which were sent to pathology for evaluation.  Having completed the resection and the chips removed, we again confirmed hemostasis with the loop with coagulating current.  Upon completion of the entire procedure, the bladder and posterior urethra were reexamined, confirming open prostatic urethra and bladder neck without evidence of bleeding or perforation.  Both ureteral orifices and the external sphincter were noted to be intact.  Further, the previous bulbar urethral lesion biopsy site was examined with excellent hemostasis.  The resectoscope was withdrawn under direct vision and a 22 French three-way Foley catheter with a 30 cc balloon was inserted into the bladder.  The balloon was inflated with 30 cc of sterile water.  After multiple manual irrigations ensuring clear return of the irrigant, the procedure was terminated.  The catheter was attached to a drainage bag and continuous bladder irrigation was started with normal saline.  The patient was positioned supine.  At the end of the procedure, all counts were correct.  Patient tolerated the procedure well and was taken to the recovery room satisfactory condition.  Plan: Continuous bladder irrigation overnight with gentle Foley traction.  I will write for void trial at 5 AM.  If he is unable to pass a void trial, an 102 French Foley catheter can be replaced and he will follow-up on Monday for void trial (appointment already  scheduled).  Matt R. [facility]  Pager: [phone]

## 2021-03-30 NOTE — Transfer of Care (Signed)
Immediate Anesthesia Transfer of Care Note  Patient: Nathan Reilly  Procedure(s) Performed: Procedure(s) (LRB): TRANSURETHRAL RESECTION OF THE PROSTATE (TURP) WITH URETHRAL BIOPSY (N/A)  Patient Location: PACU  Anesthesia Type: General  Level of Consciousness: awake, oriented, sedated and patient cooperative  Airway & Oxygen Therapy: Patient Spontanous Breathing and Patient connected to face mask oxygen  Post-op Assessment: Report given to PACU RN and Post -op Vital signs reviewed and stable  Post vital signs: Reviewed and stable  Complications: No apparent anesthesia complications  Last Vitals:  Vitals Value Taken Time  BP 93/68 03/30/21 1251  Temp    Pulse 62 03/30/21 1255  Resp 14 03/30/21 1255  SpO2 93 % 03/30/21 1255  Vitals shown include unvalidated device data.  Last Pain:  Vitals:   03/30/21 1028  TempSrc:   PainSc: 0-No pain      Patients Stated Pain Goal: 7 (35/39/12 2583)  Complications: No notable events documented.

## 2021-03-30 NOTE — OR Nursing (Signed)
Foley catheter removed intraop by Isidore Moos, RN at 1140. Removed 14 cc from balloon. No resistance. 100 mL urine in bag.

## 2021-03-30 NOTE — H&P (Signed)
Office Visit Report     02/20/2021   --------------------------------------------------------------------------------   Nathan Reilly. [place]  MRN: [id number]  DOB: [birth year], 80 year old Male  SSN:    PRIMARY CARE:  Pricilla Holm  REFERRING:  Janith Lima, MD  PROVIDER:  Rexene Alberts, M.D.  LOCATION:  [facility] [phone]     --------------------------------------------------------------------------------   CC/HPI: Jaiceon Collister is an 80 year old male seen in consultation today for BPH/LUTS/urinary retention.   1. BPH/LUTS/urinary retention: He was found to be in urinary retention who presented to hospital in [place] while was on vacation with complaints of dysuria, urgency, chills and fever. He was diagnosed with a urinary tract infection. This is complicated by C. difficile infection. Prior to this episode, he stated that he voided with a reasonable flow stream without sensation of incomplete bladder emptying. He was started on doxazosin during the hospitalization and subsequently started on finasteride 5 mg daily. Renal ultrasound 01/22/2021 demonstrated no hydronephrosis. He failed a void trial on 02/07/2021. Foley catheter was replaced with return of 850 mL clear yellow urine.  -TRUS ultrasound 02/13/2021 demonstrated 61cm^3 prostate.  -Urodynamics 02/13/2021 demonstrated that his capacity was only 100 mL with unstable bladder contractions around 80 mL with max detrusor contraction at 58 cm water. 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 He was able to generate a voluntary contraction and void. PVR was 4 mL. Foley catheter was replaced. He was found to be equivocal on the flow curve. His average flow was 2.4 mL/second.  -Cystoscopy 02/20/2021 demonstrated bilobar hyperplasia as well as a papillary lesion within the bulbar urethra at the 3 o'clock position.  -He had uncontrolled voiding contraction and leak urine. PVR by bladder scan was  400 however PVR by Foley catheter was around 250 mL.  -We discussed options and he elected for Foley catheter placement and to schedule TURP with biopsy of his ureteral lesion.   #2. Prostate cancer screening: PSA on 01/14/2021 was 2.84. This is obtained prior to starting finasteride. He denies a family history of prostate cancer.   3. History of UTI: He was found to have urinary tension as well as urine tract infection in 01/2021 while he was hospitalized in [place]. Unfortunately developed C. difficile following this. He then had a catheterized urine culture on 02/13/2021 which resulted greater than 100,000 Enterococcus. He was placed on Macrobid which she is currently taking. He denies fevers or chills.   4. Urethral lesion: Cystoscopy 02/20/2021 with papillary lesions subcentimeter in size at the 3 o'clock position in the bulbar urethra. He denies a smoking history. He denies a family history of urologic malignancy.   He denies a cardiac or pulmonary history. He does have hypertension and hypothyroidism and hyperlipidemia.     ALLERGIES: None   MEDICATIONS: Augmentin 500 mg-125 mg tablet  Finasteride 5 mg tablet 1 tablet PO Daily  Lisinopril 10 mg tablet  Macrobid 100 mg capsule 1 capsule PO BID  Macrobid 100 mg capsule 1 capsule PO BID  Doxazosin Mesylate 4 mg tablet  Levothyroxine 50 mcg capsule  Trospium Chloride Er 60 mg capsule, ext release 24 hr 1 capsule PO Daily  Vancocin Hcl 125 mg capsule     GU PSH: Complex cystometrogram, w/ void pressure and urethral pressure profile studies, any technique - 02/13/2021 Complex Uroflow - 02/13/2021 Emg surf Electrd - 02/13/2021 Inject For cystogram - 02/13/2021 Intrabd voidng Press - 02/13/2021     NON-GU PSH: Knee replacement, 2016  GU PMH: Urinary Retention - 02/13/2021, - 02/06/2021 Acute Cystitis/UTI - 02/06/2021    NON-GU PMH: Arthritis Hypercholesterolemia Hypertension Hypothyroidism    FAMILY HISTORY: 2 sons - Runs in  Family   SOCIAL HISTORY: Marital Status: Married Preferred Language: English; Ethnicity: Not Hispanic Or Latino; Race: White Current Smoking Status: Patient has never smoked.   Tobacco Use Assessment Completed: Used Tobacco in last 30 days? Social Drinker.  Drinks 1 caffeinated drink per day.    REVIEW OF SYSTEMS:    GU Review Male:   Patient denies frequent urination, hard to postpone urination, burning/ pain with urination, get up at night to urinate, leakage of urine, stream starts and stops, trouble starting your stream, have to strain to urinate , erection problems, and penile pain.  Gastrointestinal (Upper):   Patient denies nausea, vomiting, and indigestion/ heartburn.  Gastrointestinal (Lower):   Patient denies diarrhea and constipation.  Constitutional:   Patient denies fever, night sweats, weight loss, and fatigue.  Skin:   Patient denies skin rash/ lesion and itching.  Eyes:   Patient denies blurred vision and double vision.  Ears/ Nose/ Throat:   Patient denies sore throat and sinus problems.  Hematologic/Lymphatic:   Patient denies swollen glands and easy bruising.  Cardiovascular:   Patient denies leg swelling and chest pains.  Respiratory:   Patient denies cough and shortness of breath.  Endocrine:   Patient denies excessive thirst.  Musculoskeletal:   Patient denies back pain and joint pain.  Neurological:   Patient denies headaches and dizziness.  Psychologic:   Patient denies depression and anxiety.   VITAL SIGNS: None   GU PHYSICAL EXAMINATION:    Anus and Perineum: No hemorrhoids. No anal stenosis. No rectal fissure, no anal fissure. No edema, no dimple, no perineal tenderness, no anal tenderness.  Urethral Meatus: Normal size. No lesion, no wart, no discharge, no polyp. Normal location.  Prostate: 60 grams, no nodules  Seminal Vesicles: Nonpalpable.  Sphincter Tone: Normal sphincter. No rectal tenderness. No rectal mass.    MULTI-SYSTEM PHYSICAL EXAMINATION:     Constitutional: Well-nourished. No physical deformities. Normally developed. Good grooming.  Respiratory: No labored breathing, no use of accessory muscles.   Cardiovascular: Normal temperature, normal extremity pulses, no swelling, no varicosities.  Gastrointestinal: No mass, no tenderness, no rigidity, non obese abdomen.     Complexity of Data:  Source Of History:  Patient, Medical Record Summary  Lab Test Review:   PSA  Records Review:   AUA Symptom Score, Previous Doctor Records, Previous Hospital Records, Previous Patient Records  Urine Test Review:   Urinalysis  Urodynamics Review:   Review Bladder Scan, Review Urodynamics Tests   PROCEDURES:         Flexible Cystoscopy - 52000  Risks, benefits, and some of the potential complications of the procedure were discussed at length with the patient including infection, bleeding, voiding discomfort, urinary retention, fever, chills, sepsis, and others. All questions were answered. Informed consent was obtained. Antibiotic prophylaxis was given. Sterile technique and intraurethral analgesia were used.  Meatus:  Normal size. Normal location. Normal condition.  Urethra:  No strictures. Subcentimeter papillary lesion at the 3 oclock position in the bulbar urethra attached to a small stalk.  External Sphincter:  Normal.  Verumontanum:  Normal.  Prostate:  Obstructing. Moderate hyperplasia.  Bladder Neck:  Non-obstructing.  Ureteral Orifices:  Normal location. Normal size. Normal shape. Effluxed clear urine.  Bladder:  4+ trabeculation. No tumors. Evidence of inflammation on the posterior bladder wall consistent  with foley catheter irritation      The lower urinary tract was carefully examined. The procedure was well-tolerated and without complications. Antibiotic instructions were given. Instructions were given to call the office immediately for bloody urine, difficulty urinating, urinary retention, painful or frequent urination, fever, chills,  nausea, vomiting or other illness. The patient stated that he understood these instructions and would comply with them.        PVR Ultrasound - 08657  Voided Volume: 100 cc  Notes: After cystoscopy, flow rate was attempted. However, pt had significant voiding accident on the floor. 500 ml was instilled with cystocopy. There was at least 100-200 ml voided on the table and floor. He voided 100 ml independently.  Scanned Volume: 440 cc   ASSESSMENT:      ICD-10 Details  1 GU:   Urinary Retention - R33.8   2   BPH w/LUTS - N40.1   3   Encounter for Prostate Cancer screening - Z12.5   4   Urinary Urgency - R39.15   5   Urethral disorders, Unspec - N36.9    PLAN:           Schedule Return Visit/Planned Activity: 1 Month - Nurse Visit             Note: Foley exchange          Document Letter(s):  Created for Patient: Clinical Summary         Notes:   #1. BPH/LUTS/urinary retention: He is presently in urinary retention. TRUS volume is 61CM^3. Urodynamics with evidence of average flow stream 2.4 mL/second with good detrusor function at 58 cm water. He is on doxazosin and finasteride. His void trial today was equivocal with estimated 250 mL in his bladder. We discussed options he elects to proceed with Foley catheter placement today and schedule TURP with concomitant urethral lesion biopsy. I discussed risk and benefits of this procedure. We will obtain medical clearance. Surgery letter sent.   2. Overactive bladder: He does have evidence of detrusor overactivity with leakage on his urodynamics 02/13/2021. I counseled him that he will likely have this overactivity even after TURP. He remains on trospium for this.   #3. Prostate cancer screening: PSA 12/2020 was normal at 2.84. Recommend annual PSA screening. I did discuss that tissue will be removed from prostate and will be evaluated for cancer and that TURP is not a treatment for prostate cancer. He expresses understanding.   4. Urethral lesion:  I recommend biopsy of urethra at time of TURP. Discussed risk and benefits.   5. History of UTI: He is currently asymptomatic for UTI and is likely colonized. No need for further antibiotics unless he develops symptoms.   CC: Pricilla Holm, MD  CC: Richmond Campbell, MD        Next Appointment:      Next Appointment: 03/20/2021 08:30 AM    Appointment Type: Nurse Visit NO Urine    Location: [facility] [phone]    Provider: [place]pe Greenfield    Reason for Visit: 1 mo foley exchange      E & M CODES: 65 minutes were spent in coordination of care for the patient, which included preparation, documentation and results review, face-to-face interaction, discussions on care with the patient and family, exams and forming plan of treatment.    Urology Preoperative H&P   Chief Complaint: BPH  History of Present Illness: Nathan Reilly is a 80 y.o. male with BPH here for  TURP and biopsy of urethral lesion.    Past Medical History:  Diagnosis Date   Arthritis    BPH with urinary obstruction    Foley catheter in place    History of basal cell carcinoma (BCC) of skin    History of Clostridioides difficile colitis 01/2021   per pt resolved   Hypertension    Hypothyroidism    Urinary retention 01/2021   Wears glasses     Past Surgical History:  Procedure Laterality Date   BAND HEMORRHOIDECTOMY  2015   Dr Earlean Shawl   BLEPHAROPLASTY Bilateral 2018   upper eyelids   COLONOSCOPY  2014   Dr Earlean Shawl   TONSILLECTOMY     child   TOTAL KNEE ARTHROPLASTY Left 07/26/2014   Procedure: LEFT TOTAL KNEE ARTHROPLASTY;  Surgeon: Mauri Pole, MD;  Location: [facility];  Service: Orthopedics;  Laterality: Left;    Allergies: No Known Allergies  Family History  Problem Relation Age of Onset   Diabetes Father    Breast cancer Sister    Hypertension Neg Hx    Heart disease Neg Hx    Stroke Neg Hx     Social History:  reports that he has never smoked. He has never used  smokeless tobacco. He reports that he does not currently use alcohol. He reports that he does not use drugs.  ROS: A complete review of systems was performed.  All systems are negative except for pertinent findings as noted.  Physical Exam:  Vital signs in last 24 hours: Temp:  [97.7 F (36.5 C)] 97.7 F (36.5 C) (10/21 0956) Pulse Rate:  [58] 58 (10/21 0956) Resp:  [16] 16 (10/21 0956) BP: (123)/(75) 123/75 (10/21 0956) SpO2:  [99 %] 99 % (10/21 0956) Weight:  [74.4 kg] 74.4 kg (10/21 0956) Constitutional:  Alert and oriented, No acute distress Cardiovascular: Regular rate and rhythm Respiratory: Normal respiratory effort, Lungs clear bilaterally GI: Abdomen is soft, nontender, nondistended, no abdominal masses GU: No CVA tenderness Lymphatic: No lymphadenopathy Neurologic: Grossly intact, no focal deficits Psychiatric: Normal mood and affect  Laboratory Data:  Recent Labs    03/30/21 1044  HGB 13.9  HCT 41.0    Recent Labs    03/30/21 1044  NA 138  K 4.0  CL 104  GLUCOSE 102*  BUN 20  CREATININE 1.00     Results for orders placed or performed during the hospital encounter of 03/30/21 (from the past 24 hour(s))  I-STAT, chem 8     Status: Abnormal   Collection Time: 03/30/21 10:44 AM  Result Value Ref Range   Sodium 138 135 - 145 mmol/L   Potassium 4.0 3.5 - 5.1 mmol/L   Chloride 104 98 - 111 mmol/L   BUN 20 8 - 23 mg/dL   Creatinine, Ser 1.00 0.61 - 1.24 mg/dL   Glucose, Bld 102 (H) 70 - 99 mg/dL   Calcium, Ion 1.18 1.15 - 1.40 mmol/L   TCO2 22 22 - 32 mmol/L   Hemoglobin 13.9 13.0 - 17.0 g/dL   HCT 41.0 39.0 - 52.0 %   Recent Results (from the past 240 hour(s))  SARS Coronavirus 2 (TAT 6-24 hrs)     Status: None   Collection Time: 03/27/21 12:00 AM  Result Value Ref Range Status   SARS Coronavirus 2 RESULT: NEGATIVE  Final    Comment: RESULT: NEGATIVESARS-CoV-2 INTERPRETATION:A NEGATIVE  test result means that SARS-CoV-2 RNA was not present in the  specimen above the limit of detection of  this test. This does not preclude a possible SARS-CoV-2 infection and should not be used as the  sole basis for patient management decisions. Negative results must be combined with clinical observations, patient history, and epidemiological information. Optimum specimen types and timing for peak viral levels during infections caused by SARS-CoV-2  have not been determined. Collection of multiple specimens or types of specimens may be necessary to detect virus. Improper specimen collection and handling, sequence variability under primers/probes, or organism present below the limit of detection may  lead to false negative results. Positive and negative predictive values of testing are highly dependent on prevalence. False negative test results are more likely when prevalence of disease is high.The expected result is NEGATIVE.Fact S heet for  Healthcare Providers: LocalChronicle.no Sheet for Patients: SalonLookup.es Reference Range - Negative     Renal Function: Recent Labs    03/30/21 1044  CREATININE 1.00   Estimated Creatinine Clearance: 58.9 mL/min (by C-G formula based on SCr of 1 mg/dL).  Radiologic Imaging: No results found.  I independently reviewed the above imaging studies.  Assessment and Plan BARRON VANLOAN is a 80 y.o. male with BPH here for TURP and biopsy of urethral lesion.     Matt R. Charlsey Moragne MD 03/30/2021, 11:06 AM  [facility] Pager: [phone]

## 2021-03-30 NOTE — Anesthesia Preprocedure Evaluation (Addendum)
Anesthesia Evaluation  Patient identified by MRN, date of birth, ID band Patient awake    Reviewed: Allergy & Precautions, NPO status , Patient's Chart, lab work & pertinent test results  Airway Mallampati: II  TM Distance: >3 FB Neck ROM: Full    Dental  (+) Teeth Intact, Dental Advisory Given   Pulmonary neg pulmonary ROS,    breath sounds clear to auscultation       Cardiovascular hypertension,  Rhythm:Regular Rate:Normal     Neuro/Psych negative neurological ROS  negative psych ROS   GI/Hepatic negative GI ROS, Neg liver ROS,   Endo/Other  Hypothyroidism   Renal/GU negative Renal ROS     Musculoskeletal  (+) Arthritis ,   Abdominal Normal abdominal exam  (+)   Peds  Hematology negative hematology ROS (+)   Anesthesia Other Findings   Reproductive/Obstetrics                            Anesthesia Physical Anesthesia Plan  ASA: 3  Anesthesia Plan: General   Post-op Pain Management:    Induction: Intravenous  PONV Risk Score and Plan: 3 and Ondansetron, Dexamethasone and Treatment may vary due to age or medical condition  Airway Management Planned: LMA  Additional Equipment: None  Intra-op Plan:   Post-operative Plan: Extubation in OR  Informed Consent: I have reviewed the patients History and Physical, chart, labs and discussed the procedure including the risks, benefits and alternatives for the proposed anesthesia with the patient or authorized representative who has indicated his/her understanding and acceptance.     Dental advisory given  Plan Discussed with: CRNA  Anesthesia Plan Comments:        Anesthesia Quick Evaluation

## 2021-03-31 DIAGNOSIS — N138 Other obstructive and reflux uropathy: Secondary | ICD-10-CM | POA: Diagnosis not present

## 2021-03-31 DIAGNOSIS — Z85828 Personal history of other malignant neoplasm of skin: Secondary | ICD-10-CM | POA: Diagnosis not present

## 2021-03-31 DIAGNOSIS — I10 Essential (primary) hypertension: Secondary | ICD-10-CM | POA: Diagnosis not present

## 2021-03-31 DIAGNOSIS — Z79899 Other long term (current) drug therapy: Secondary | ICD-10-CM | POA: Diagnosis not present

## 2021-03-31 DIAGNOSIS — E039 Hypothyroidism, unspecified: Secondary | ICD-10-CM | POA: Diagnosis not present

## 2021-03-31 DIAGNOSIS — Z87440 Personal history of urinary (tract) infections: Secondary | ICD-10-CM | POA: Diagnosis not present

## 2021-03-31 DIAGNOSIS — E785 Hyperlipidemia, unspecified: Secondary | ICD-10-CM | POA: Diagnosis not present

## 2021-03-31 DIAGNOSIS — N320 Bladder-neck obstruction: Secondary | ICD-10-CM | POA: Diagnosis not present

## 2021-03-31 DIAGNOSIS — Z20822 Contact with and (suspected) exposure to covid-19: Secondary | ICD-10-CM | POA: Diagnosis not present

## 2021-03-31 DIAGNOSIS — N401 Enlarged prostate with lower urinary tract symptoms: Secondary | ICD-10-CM | POA: Diagnosis not present

## 2021-03-31 LAB — BASIC METABOLIC PANEL
Anion gap: 5 (ref 5–15)
BUN: 17 mg/dL (ref 8–23)
CO2: 26 mmol/L (ref 22–32)
Calcium: 8.7 mg/dL — ABNORMAL LOW (ref 8.9–10.3)
Chloride: 103 mmol/L (ref 98–111)
Creatinine, Ser: 0.95 mg/dL (ref 0.61–1.24)
GFR, Estimated: >60 mL/min (ref >60)
Glucose, Bld: 146 mg/dL — ABNORMAL HIGH (ref 70–99)
Potassium: 4.4 mmol/L (ref 3.5–5.1)
Sodium: 134 mmol/L — ABNORMAL LOW (ref 135–145)

## 2021-03-31 LAB — CBC
HCT: 37.6 % — ABNORMAL LOW (ref 39.0–52.0)
Hemoglobin: 12.5 g/dL — ABNORMAL LOW (ref 13.0–17.0)
MCH: 31.5 pg (ref 26.0–34.0)
MCHC: 33.2 g/dL (ref 30.0–36.0)
MCV: 94.7 fL (ref 80.0–100.0)
Platelets: 154 10*3/uL (ref 150–400)
RBC: 3.97 MIL/uL — ABNORMAL LOW (ref 4.22–5.81)
RDW: 13 % (ref 11.5–15.5)
WBC: 10.1 10*3/uL (ref 4.0–10.5)
nRBC: 0 % (ref 0.0–0.2)

## 2021-03-31 NOTE — Discharge Summary (Signed)
Date of admission: 03/30/2021  Date of discharge: 03/31/2021  Admission diagnosis: Bladder outlet obstruction  Discharge diagnosis: Bladder outlet obstruction   History and Physical: For full details, please see admission history and physical. Briefly, Nathan Reilly is a 80 y.o. year old patient with bladder outlet obstruction.   Hospital Course: He underwent TURP on 03/30/21.  He successfully passed a voiding trial on POD #1 and was discharged home.  Laboratory values:  Recent Labs    03/30/21 1044 03/30/21 1315 03/31/21 0048  HGB 13.9 13.1 12.5*  HCT 41.0 40.2 37.6*   Recent Labs    03/30/21 1315 03/31/21 0048  CREATININE 1.06 0.95    Disposition: Home  Discharge instruction: The patient was instructed to be ambulatory but told to refrain from heavy lifting, strenuous activity, or driving.   Discharge medications:  Allergies as of 03/31/2021   No Known Allergies      Medication List     TAKE these medications    BENEFIBER DRINK MIX PO Take by mouth. Prebiotic fiber supplement.  2 tablespoons twice daily   docusate sodium 100 MG capsule Commonly known as: Colace Take 1 capsule (100 mg total) by mouth daily as needed for up to 30 doses.   doxazosin 4 MG tablet Commonly known as: CARDURA TAKE 1 TABLET BY MOUTH EVERY DAY   finasteride 5 MG tablet Commonly known as: PROSCAR Take 5 mg by mouth daily.   fluticasone 50 MCG/ACT nasal spray Commonly known as: FLONASE Place 2 sprays into both nostrils daily. Annual appt due in June must see provider for future refills What changed:  when to take this reasons to take this   levothyroxine 50 MCG tablet Commonly known as: SYNTHROID TAKE 1 TABLET BY MOUTH EVERY DAY What changed: when to take this   lisinopril 10 MG tablet Commonly known as: ZESTRIL TAKE 1 TABLET BY MOUTH EVERY DAY   oxyCODONE-acetaminophen 5-325 MG tablet Commonly known as: Percocet Take 1 tablet by mouth every 4 (four) hours as  needed for up to 15 doses for severe pain.   Polyethyl Glycol-Propyl Glycol 0.4-0.3 % Gel ophthalmic gel Commonly known as: SYSTANE Apply 1 drop to eye daily as needed.   PRESCRIPTION MEDICATION Take 1 tablet by mouth daily. Takes prescription for bladder spasm's,  unsure of name   Trospium Chloride 60 MG Cp24 Take 1 capsule by mouth daily.        Followup:   Follow-up Information     [facility] Follow up on 04/02/2021.   Why: 8:45AM Contact information: [address] [place] [phone]

## 2021-04-02 ENCOUNTER — Encounter (HOSPITAL_BASED_OUTPATIENT_CLINIC_OR_DEPARTMENT_OTHER): Payer: Self-pay | Admitting: Urology

## 2021-04-02 DIAGNOSIS — R3915 Urgency of urination: Secondary | ICD-10-CM | POA: Diagnosis not present

## 2021-04-02 LAB — SURGICAL PATHOLOGY

## 2021-04-06 NOTE — Anesthesia Postprocedure Evaluation (Signed)
Anesthesia Post Note  Patient: Nathan Reilly  Procedure(s) Performed: TRANSURETHRAL RESECTION OF THE PROSTATE (TURP) WITH URETHRAL BIOPSY (Prostate)     Patient location during evaluation: PACU Anesthesia Type: General Level of consciousness: awake and alert Pain management: pain level controlled Vital Signs Assessment: post-procedure vital signs reviewed and stable Respiratory status: spontaneous breathing, nonlabored ventilation, respiratory function stable and patient connected to nasal cannula oxygen Cardiovascular status: blood pressure returned to baseline and stable Postop Assessment: no apparent nausea or vomiting Anesthetic complications: no   No notable events documented.              Effie Berkshire

## 2021-04-16 ENCOUNTER — Ambulatory Visit (INDEPENDENT_AMBULATORY_CARE_PROVIDER_SITE_OTHER): Payer: Medicare Other

## 2021-04-16 ENCOUNTER — Other Ambulatory Visit: Payer: Self-pay

## 2021-04-16 DIAGNOSIS — I10 Essential (primary) hypertension: Secondary | ICD-10-CM

## 2021-04-16 DIAGNOSIS — N138 Other obstructive and reflux uropathy: Secondary | ICD-10-CM

## 2021-04-16 DIAGNOSIS — N401 Enlarged prostate with lower urinary tract symptoms: Secondary | ICD-10-CM

## 2021-04-16 DIAGNOSIS — E039 Hypothyroidism, unspecified: Secondary | ICD-10-CM

## 2021-04-16 NOTE — Progress Notes (Signed)
Chronic Care Management Pharmacy Note  04/16/2021 Name:  Nathan Reilly MRN:  [id number] DOB:  1940/06/18  Summary: -Patient reports that he has been recovering form prostate surgery 03/30/21 - notes that he feels he is doing very well, recovering quickly and able to return to his normal routine -Reports that he has since stopped finasteride and trospium as they are no longer needed since most recent surgery  -Patient reports BP has been well controlled, has not been >130/80  Recommendations/Changes made from today's visit: -Recommending no changes to medications at this time, patient to continue monitoring blood pressure at least once weekly, will make clinic aware should BP average >140/90  Subjective: Nathan Reilly is an 80 y.o. year old male who is a primary patient of Hoyt Koch, MD.  The CCM team was consulted for assistance with disease management and care coordination needs.    Engaged with patient by telephone for follow up visit in response to provider referral for pharmacy case management and/or care coordination services.   Consent to Services:  The patient was given the following information about Chronic Care Management services today, agreed to services, and gave verbal consent: 1. CCM service includes personalized support from designated clinical staff supervised by the primary care provider, including individualized plan of care and coordination with other care providers 2. 24/7 contact phone numbers for assistance for urgent and routine care needs. 3. Service will only be billed when office clinical staff spend 20 minutes or more in a month to coordinate care. 4. Only one practitioner may furnish and bill the service in a calendar month. 5.The patient may stop CCM services at any time (effective at the end of the month) by phone call to the office staff. 6. The patient will be responsible for cost sharing (co-pay) of up to 20% of the service fee (after annual  deductible is met). Patient agreed to services and consent obtained.  Patient Care Team: Hoyt Koch, MD as PCP - General (Internal Medicine) Marica Otter, [place] (Optometry) [place], Cleaster Corin, [facility] as Pharmacist (Pharmacist)  Recent office visits: 02/01/2021 - Dr. Sharlet Salina - hospital follow up [place] - ended up being diagnosed with C Diff as well - finish vancomycin course for C Diff  01/25/2021 - Dr. Mitchel Honour - recurrent UTI  - continue Augmentin course - complete PO vancomycin course for C Diff  01/11/2021 - Dr. Sharlet Salina  - no changes to medications    Recent consult visits: [facility] visits: 03/30/2021 - Procedure - transurethral resection of the prostate with urethra biopsy 02/04/2021 - To ED for catheter complication - corrected and discharged  01/26/2021 - To ED for issue with Foley Catheter - corrected, discharged   Objective:  Lab Results  Component Value Date   CREATININE 0.95 03/31/2021   BUN 17 03/31/2021   GFR 53.38 (L) 01/25/2021   GFRNONAA >60 03/31/2021   GFRAA 77 (L) 07/28/2014   NA 134 (L) 03/31/2021   K 4.4 03/31/2021   CALCIUM 8.7 (L) 03/31/2021   CO2 26 03/31/2021   GLUCOSE 146 (H) 03/31/2021    Lab Results  Component Value Date/Time   HGBA1C 5.6 01/06/2020 08:26 AM   HGBA1C 5.5 07/04/2015 09:14 AM   GFR 53.38 (L) 01/25/2021 12:14 PM   GFR 52.89 (L) 01/11/2021 09:02 AM    Last diabetic Eye exam:  No results found for: HMDIABEYEEXA  Last diabetic Foot exam:  No results found for: HMDIABFOOTEX  Lab Results  Component Value Date   CHOL 181 01/11/2021   HDL 66.50 01/11/2021   LDLCALC 105 (H) 01/11/2021   TRIG 50.0 01/11/2021   CHOLHDL 3 01/11/2021    Hepatic Function Latest Ref Rng & Units 01/25/2021 01/11/2021 01/06/2020  Total Protein 6.0 - 8.3 g/dL 6.9 6.9 6.8  Albumin 3.5 - 5.2 g/dL 3.7 4.2 -  AST 0 - 37 U/L 45(H) 27 26  ALT 0 - 53 U/L 40 24 21  Alk Phosphatase 39 - 117 U/L 73 73 -  Total  Bilirubin 0.2 - 1.2 mg/dL 0.8 0.6 0.5  Bilirubin, Direct 0.0 - 0.3 mg/dL - - -    Lab Results  Component Value Date/Time   TSH 3.23 01/11/2021 09:02 AM   TSH 4.52 (H) 01/06/2020 08:26 AM   FREET4 1.04 01/11/2021 09:02 AM   FREET4 1.3 01/06/2020 08:26 AM    CBC Latest Ref Rng & Units 03/31/2021 03/30/2021 03/30/2021  WBC 4.0 - 10.5 K/uL 10.1 6.1 -  Hemoglobin 13.0 - 17.0 g/dL 12.5(L) 13.1 13.9  Hematocrit 39.0 - 52.0 % 37.6(L) 40.2 41.0  Platelets 150 - 400 K/uL 154 141(L) -    No results found for: VD25OH  Clinical ASCVD: No  The ASCVD Risk score (Arnett DK, et al., 2019) failed to calculate for the following reasons:   The 2019 ASCVD risk score is only valid for ages 16 to 34    Depression screen PHQ 2/9 01/11/2021 01/06/2020 01/04/2019  Decreased Interest 0 0 0  Down, Depressed, Hopeless 0 0 0  PHQ - 2 Score 0 0 0    Social History   Tobacco Use  Smoking Status Never  Smokeless Tobacco Never   BP Readings from Last 3 Encounters:  03/31/21 113/75  02/04/21 (!) 157/96  02/01/21 104/80   Pulse Readings from Last 3 Encounters:  03/31/21 77  02/04/21 64  02/01/21 (!) 52   Wt Readings from Last 3 Encounters:  03/30/21 164 lb 1.6 oz (74.4 kg)  02/04/21 163 lb (73.9 kg)  02/01/21 162 lb 9.6 oz (73.8 kg)   BMI Readings from Last 3 Encounters:  03/30/21 24.23 kg/m  02/04/21 24.07 kg/m  02/01/21 24.01 kg/m    Assessment/Interventions: Review of patient past medical history, allergies, medications, health status, including review of consultants reports, laboratory and other test data, was performed as part of comprehensive evaluation and provision of chronic care management services.   SDOH:  (Social Determinants of Health) assessments and interventions performed: Yes  SDOH Screenings   Alcohol Screen: Not on file  Depression (PHQ2-9): Low Risk    PHQ-2 Score: 0  Financial Resource Strain: Low Risk    Difficulty of Paying Living Expenses: Not hard at all  Food  Insecurity: Not on file  Housing: Not on file  Physical Activity: Not on file  Social Connections: Not on file  Stress: Not on file  Tobacco Use: Low Risk    Smoking Tobacco Use: Never   Smokeless Tobacco Use: Never   Passive Exposure: Not on file  Transportation Needs: Not on file    [place]  No Known Allergies  Medications Reviewed Today     Reviewed by Bonney Aid, CRNA (Certified Registered Nurse Anesthetist) on 03/30/21 at [place] List Status: <None>   Medication Order Taking? Sig Documenting Provider Last Dose Status Informant  doxazosin (CARDURA) 4 MG tablet [id number] Yes TAKE 1 TABLET BY MOUTH EVERY DAY  Patient taking differently: Take 4 mg by mouth  daily.   Hoyt Koch, MD 03/30/2021 Active Self  finasteride (PROSCAR) 5 MG tablet [id number] Yes Take 5 mg by mouth daily. [provider] 03/30/2021 Active Self  fluticasone (FLONASE) 50 MCG/ACT nasal spray [id number] Yes Place 2 sprays into both nostrils daily. Annual appt due in June must see provider for future refills  Patient taking differently: Place 2 sprays into both nostrils daily as needed. Annual appt due in June must see provider for future refills   Hoyt Koch, MD Past Week Active Self  levothyroxine (SYNTHROID) 50 MCG tablet [id number] Yes TAKE 1 TABLET BY MOUTH EVERY DAY  Patient taking differently: Take 50 mcg by mouth daily before breakfast.   Hoyt Koch, MD 03/30/2021 Active Self  lisinopril (ZESTRIL) 10 MG tablet [id number] Yes TAKE 1 TABLET BY MOUTH EVERY DAY  Patient taking differently: Take 10 mg by mouth daily.   Hoyt Koch, MD 03/29/2021 Active Self  Polyethyl Glycol-Propyl Glycol 0.4-0.3 % GEL [id number] Yes Apply 1 drop to eye daily as needed. [provider] Past Week Active Self  PRESCRIPTION MEDICATION [id number] Yes Take 1 tablet by mouth daily. Takes prescription for bladder spasm's,  unsure of name [provider]   Consider Medication Status and Discontinue (Patient Preference) Self  Trospium Chloride 60 MG CP24 [id number] Yes Take 1 capsule by mouth daily. [provider] 03/30/2021 Active Self  Wheat Dextrin (BENEFIBER DRINK [place]) [id number] Yes Take by mouth. Prebiotic fiber supplement.  2 tablespoons twice daily [provider] 03/29/2021 Active Self            Patient Active Problem List   Diagnosis Date Noted   C. 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   Enlarged prostate 01/11/2021   Hypothyroidism 05/14/2017   Essential hypertension 07/17/2007   Hx of colonic polyps 07/17/2007    Immunization History  Administered Date(s) Administered   Fluad Quad(high Dose 65+) 02/12/2019   Influenza Split 04/11/2011, 04/02/2012   Influenza Whole 02/15/2010   Influenza, High Dose Seasonal PF 04/06/2014, 04/01/2016, 04/12/2017, 02/18/2018, 03/16/2021   Influenza, Seasonal, Injecte, Preservative Fre 03/03/2013   Influenza,inj,Quad PF,6+ Mos 02/04/2020   Influenza-Unspecified 03/15/2015, 04/12/2017, 02/18/2018   PFIZER(Purple Top)SARS-COV-2 Vaccination 07/15/2019, 08/09/2019, 03/06/2020, 10/16/2020   Pfizer Covid-19 Vaccine Bivalent Booster 70yr & up 03/16/2021   Pneumococcal Conjugate-13 07/15/2013   Pneumococcal Polysaccharide-23 02/03/2006, 06/24/2012   Td 02/15/2010   Tdap 12/17/2019   Zoster Recombinat (Shingrix) 03/09/2018, 05/19/2018   Zoster, Live 02/03/2006    Conditions to be addressed/monitored:  Hypertension, Hypothyroidism, and BPH  There are no care plans that you recently modified to display for this patient.   Medication Assistance: None required.  Patient affirms current coverage meets needs.   Patient's preferred pharmacy is:  [id number] [place], [place] - [address] [place]: [phone]: [id number]: [phone]: [phone] Uses pill box? No - able to manage without Pt endorses 100% compliance  Care Plan and Follow Up Patient Decision:  Patient agrees to Care Plan and Follow-up.  Plan: Telephone follow up appointment with care management team member scheduled for:  6 months The patient has been provided with contact information for the care management team and has been advised to call with any health  related questions or concerns.   Tomasa Blase, PharmD Clinical Pharmacist, [place]

## 2021-04-16 NOTE — Patient Instructions (Signed)
[name],  It was great to talk to you today!  Please call me with any questions or concerns.  Visit Information  Patient verbalizes understanding of instructions provided today and agrees to view in [place].   Telephone follow up appointment with care management team member scheduled for: 6 months The patient has been provided with contact information for the care management team and has been advised to call with any health related questions or concerns.   Tomasa Blase, PharmD Clinical Pharmacist, [place]

## 2021-05-09 DIAGNOSIS — I10 Essential (primary) hypertension: Secondary | ICD-10-CM | POA: Diagnosis not present

## 2021-05-09 DIAGNOSIS — N138 Other obstructive and reflux uropathy: Secondary | ICD-10-CM | POA: Diagnosis not present

## 2021-05-09 DIAGNOSIS — N401 Enlarged prostate with lower urinary tract symptoms: Secondary | ICD-10-CM | POA: Diagnosis not present

## 2021-05-09 DIAGNOSIS — E039 Hypothyroidism, unspecified: Secondary | ICD-10-CM

## 2021-06-26 DIAGNOSIS — Z86010 Personal history of colonic polyps: Secondary | ICD-10-CM | POA: Diagnosis not present

## 2021-06-26 DIAGNOSIS — D124 Benign neoplasm of descending colon: Secondary | ICD-10-CM | POA: Diagnosis not present

## 2021-06-26 DIAGNOSIS — K635 Polyp of colon: Secondary | ICD-10-CM | POA: Diagnosis not present

## 2021-06-26 DIAGNOSIS — K5730 Diverticulosis of large intestine without perforation or abscess without bleeding: Secondary | ICD-10-CM | POA: Diagnosis not present

## 2021-06-26 DIAGNOSIS — Z800 Family history of malignant neoplasm of digestive organs: Secondary | ICD-10-CM | POA: Diagnosis not present

## 2021-06-26 DIAGNOSIS — Z1211 Encounter for screening for malignant neoplasm of colon: Secondary | ICD-10-CM | POA: Diagnosis not present

## 2021-06-29 DIAGNOSIS — Z23 Encounter for immunization: Secondary | ICD-10-CM | POA: Diagnosis not present

## 2021-06-29 DIAGNOSIS — Z86018 Personal history of other benign neoplasm: Secondary | ICD-10-CM | POA: Diagnosis not present

## 2021-06-29 DIAGNOSIS — D225 Melanocytic nevi of trunk: Secondary | ICD-10-CM | POA: Diagnosis not present

## 2021-06-29 DIAGNOSIS — L578 Other skin changes due to chronic exposure to nonionizing radiation: Secondary | ICD-10-CM | POA: Diagnosis not present

## 2021-06-29 DIAGNOSIS — Z85828 Personal history of other malignant neoplasm of skin: Secondary | ICD-10-CM | POA: Diagnosis not present

## 2021-06-29 DIAGNOSIS — L570 Actinic keratosis: Secondary | ICD-10-CM | POA: Diagnosis not present

## 2021-06-29 DIAGNOSIS — L821 Other seborrheic keratosis: Secondary | ICD-10-CM | POA: Diagnosis not present

## 2021-07-09 DIAGNOSIS — N401 Enlarged prostate with lower urinary tract symptoms: Secondary | ICD-10-CM | POA: Diagnosis not present

## 2021-07-09 DIAGNOSIS — R350 Frequency of micturition: Secondary | ICD-10-CM | POA: Diagnosis not present

## 2021-07-09 DIAGNOSIS — N5201 Erectile dysfunction due to arterial insufficiency: Secondary | ICD-10-CM | POA: Diagnosis not present

## 2021-11-02 ENCOUNTER — Telehealth: Payer: Medicare Other

## 2021-11-27 ENCOUNTER — Telehealth: Payer: Medicare Other

## 2021-12-04 ENCOUNTER — Other Ambulatory Visit: Payer: Self-pay | Admitting: Internal Medicine

## 2022-01-14 ENCOUNTER — Telehealth: Payer: Self-pay | Admitting: Internal Medicine

## 2022-01-14 NOTE — Telephone Encounter (Signed)
N/A unable to leave a message for patient to call back to schedule Medicare Annual Wellness Visit   Last AWV  01/11/21  Please schedule at anytime with [facility] if patient calls the office back.       Any questions, please call me at [phone]

## 2022-02-15 ENCOUNTER — Ambulatory Visit (INDEPENDENT_AMBULATORY_CARE_PROVIDER_SITE_OTHER): Payer: PPO | Admitting: Internal Medicine

## 2022-02-15 ENCOUNTER — Encounter: Payer: Self-pay | Admitting: Internal Medicine

## 2022-02-15 VITALS — BP 122/78 | HR 55 | Temp 98.0°F | Ht 69.0 in | Wt 169.6 lb

## 2022-02-15 DIAGNOSIS — Z0000 Encounter for general adult medical examination without abnormal findings: Secondary | ICD-10-CM | POA: Diagnosis not present

## 2022-02-15 DIAGNOSIS — N138 Other obstructive and reflux uropathy: Secondary | ICD-10-CM

## 2022-02-15 DIAGNOSIS — E039 Hypothyroidism, unspecified: Secondary | ICD-10-CM

## 2022-02-15 DIAGNOSIS — Z23 Encounter for immunization: Secondary | ICD-10-CM | POA: Diagnosis not present

## 2022-02-15 DIAGNOSIS — N401 Enlarged prostate with lower urinary tract symptoms: Secondary | ICD-10-CM

## 2022-02-15 DIAGNOSIS — I10 Essential (primary) hypertension: Secondary | ICD-10-CM

## 2022-02-15 LAB — LIPID PANEL
Cholesterol: 162 mg/dL (ref 0–200)
HDL: 60.5 mg/dL (ref >39.00)
LDL Cholesterol: 90 mg/dL (ref 0–99)
NonHDL: 101.63
Total CHOL/HDL Ratio: 3
Triglycerides: 57 mg/dL (ref 0.0–149.0)
VLDL: 11.4 mg/dL (ref 0.0–40.0)

## 2022-02-15 LAB — CBC
HCT: 43.1 % (ref 39.0–52.0)
Hemoglobin: 14.6 g/dL (ref 13.0–17.0)
MCHC: 33.8 g/dL (ref 30.0–36.0)
MCV: 93.2 fl (ref 78.0–100.0)
Platelets: 173 10*3/uL (ref 150.0–400.0)
RBC: 4.63 Mil/uL (ref 4.22–5.81)
RDW: 13.8 % (ref 11.5–15.5)
WBC: 6.5 10*3/uL (ref 4.0–10.5)

## 2022-02-15 LAB — COMPREHENSIVE METABOLIC PANEL
ALT: 23 U/L (ref 0–53)
AST: 24 U/L (ref 0–37)
Albumin: 4 g/dL (ref 3.5–5.2)
Alkaline Phosphatase: 78 U/L (ref 39–117)
BUN: 21 mg/dL (ref 6–23)
CO2: 25 mEq/L (ref 19–32)
Calcium: 9 mg/dL (ref 8.4–10.5)
Chloride: 106 mEq/L (ref 96–112)
Creatinine, Ser: 1.21 mg/dL (ref 0.40–1.50)
GFR: 56.15 mL/min — ABNORMAL LOW (ref >60.00)
Glucose, Bld: 95 mg/dL (ref 70–99)
Potassium: 4.6 mEq/L (ref 3.5–5.1)
Sodium: 138 mEq/L (ref 135–145)
Total Bilirubin: 0.6 mg/dL (ref 0.2–1.2)
Total Protein: 6.7 g/dL (ref 6.0–8.3)

## 2022-02-15 LAB — TSH: TSH: 4.12 u[IU]/mL (ref 0.35–5.50)

## 2022-02-15 LAB — T4, FREE: Free T4: 0.93 ng/dL (ref 0.60–1.60)

## 2022-02-15 LAB — PSA: PSA: 0.97 ng/mL (ref 0.10–4.00)

## 2022-02-15 NOTE — Assessment & Plan Note (Signed)
Flu shot given. Covid-19 counseled. Pneumonia complete. Shingrix complete. Tetanus due 2031. Colonoscopy aged out. Counseled about sun safety and mole surveillance. Counseled about the dangers of distracted driving. Given 10 year screening recommendations.

## 2022-02-15 NOTE — Assessment & Plan Note (Signed)
Checking PSA, doing well since procedure. Taking cardura 4 mg daily.

## 2022-02-15 NOTE — Assessment & Plan Note (Signed)
BP at goal on lisinopril 10 mg daily. Checking CMP and lipid panel and CBC. Adjust as needed.

## 2022-02-15 NOTE — Progress Notes (Signed)
Subjective:   Patient ID: Nathan Reilly, male    DOB: 04-Jul-1940, 81 y.o.   MRN: [id number]  HPI Here for medicare wellness and physical, no new complaints. Please see A/P for status and treatment of chronic medical problems.   Diet: heart healthy Physical activity: golf, active Depression/mood screen: negative Hearing: intact to whispered voice Visual acuity: grossly normal with lens, performs annual eye exam  ADLs: capable Fall risk: none Home safety: good Cognitive evaluation: intact to orientation, naming, recall and repetition EOL planning: adv directives discussed, in place  [employer] Visit from 01/11/2021 in [place] at [facility] Total Score 0           01/06/2020    8:02 AM 01/11/2021    8:03 AM 01/26/2021   10:46 AM 02/04/2021    8:35 AM 03/30/2021   10:31 AM  Fall Risk  Falls in the past year? 1 0     Was there an injury with Fall? 0 0     Fall Risk Category Calculator 1 0     Fall Risk Category Low Low     Patient Fall Risk Level   Low fall risk Low fall risk Moderate fall risk    I have personally reviewed and have noted 1. The patient's medical and social history - reviewed today no changes 2. Their use of alcohol, tobacco or illicit drugs 3. Their current medications and supplements 4. The patient's functional ability including ADL's, fall risks, home safety risks and hearing or visual impairment. 5. Diet and physical activities 6. Evidence for depression or mood disorders 7. Care team reviewed and updated 8.  The patient is not on an opioid pain medication.  Patient Care Team: Hoyt Koch, MD as PCP - General (Internal Medicine) Marica Otter, [place] (Optometry) [place], Cleaster Corin, [facility] as Pharmacist (Pharmacist) Past Medical History:  Diagnosis Date   Arthritis    BPH with urinary obstruction    Foley catheter in place    History of basal cell carcinoma (BCC) of skin    History of Clostridioides difficile  colitis 01/2021   per pt resolved   Hypertension    Hypothyroidism    Urinary retention 01/2021   Wears glasses    Past Surgical History:  Procedure Laterality Date   BAND HEMORRHOIDECTOMY  2015   Dr Earlean Shawl   BLEPHAROPLASTY Bilateral 2018   upper eyelids   COLONOSCOPY  2014   Dr Earlean Shawl   TONSILLECTOMY     child   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   HENT: Negative.    Eyes: Negative.   Respiratory:  Negative for cough, chest tightness and shortness of breath.   Cardiovascular:  Negative for chest pain, palpitations and leg swelling.  Gastrointestinal:  Negative for abdominal distention, abdominal pain, constipation, diarrhea, nausea and vomiting.  Musculoskeletal: Negative.  Skin: Negative.   Neurological: Negative.   Psychiatric/Behavioral: Negative.      Objective:  Physical Exam Constitutional:      Appearance: He is well-developed.  HENT:     Head: Normocephalic and atraumatic.  Cardiovascular:     Rate and Rhythm: Normal rate and regular rhythm.  Pulmonary:     Effort: Pulmonary effort is normal. No respiratory distress.     Breath sounds: Normal breath sounds. No wheezing or rales.  Abdominal:     General: Bowel sounds are normal. There is no distension.     Palpations: Abdomen is soft.     Tenderness: There is no abdominal tenderness. There is no rebound.  Musculoskeletal:      Cervical back: Normal range of motion.  Skin:    General: Skin is warm and dry.  Neurological:     Mental Status: He is alert and oriented to person, place, and time.     Coordination: Coordination normal.     Vitals:   02/15/22 0755  BP: 122/78  Pulse: (!) 55  Temp: 98 F (36.7 C)  TempSrc: Oral  SpO2: 98%  Weight: 169 lb 9.6 oz (76.9 kg)  Height: '5\' 9"'$  (1.753 m)    Assessment & Plan:  Flu shot given at visit

## 2022-02-15 NOTE — Patient Instructions (Signed)
We have given you the flu shot today.  

## 2022-02-15 NOTE — Assessment & Plan Note (Signed)
Checking TSH and free T4 and adjust as needed. Taking synthroid 50 mcg daily.  

## 2022-03-10 ENCOUNTER — Other Ambulatory Visit: Payer: Self-pay | Admitting: Internal Medicine

## 2022-03-27 ENCOUNTER — Other Ambulatory Visit: Payer: Self-pay | Admitting: Internal Medicine

## 2022-04-25 DIAGNOSIS — Z96652 Presence of left artificial knee joint: Secondary | ICD-10-CM | POA: Diagnosis not present

## 2022-04-25 DIAGNOSIS — M25552 Pain in left hip: Secondary | ICD-10-CM | POA: Diagnosis not present

## 2022-05-01 DIAGNOSIS — M25552 Pain in left hip: Secondary | ICD-10-CM | POA: Diagnosis not present

## 2022-06-12 DIAGNOSIS — M25552 Pain in left hip: Secondary | ICD-10-CM | POA: Diagnosis not present

## 2022-06-12 DIAGNOSIS — Z96652 Presence of left artificial knee joint: Secondary | ICD-10-CM | POA: Diagnosis not present

## 2022-06-20 DIAGNOSIS — N401 Enlarged prostate with lower urinary tract symptoms: Secondary | ICD-10-CM | POA: Diagnosis not present

## 2022-06-20 DIAGNOSIS — N5201 Erectile dysfunction due to arterial insufficiency: Secondary | ICD-10-CM | POA: Diagnosis not present

## 2022-06-20 DIAGNOSIS — R3915 Urgency of urination: Secondary | ICD-10-CM | POA: Diagnosis not present

## 2022-06-24 ENCOUNTER — Other Ambulatory Visit: Payer: Self-pay | Admitting: Internal Medicine

## 2022-08-19 ENCOUNTER — Ambulatory Visit (INDEPENDENT_AMBULATORY_CARE_PROVIDER_SITE_OTHER): Payer: Medicare HMO | Admitting: Family Medicine

## 2022-08-19 ENCOUNTER — Encounter: Payer: Self-pay | Admitting: Family Medicine

## 2022-08-19 VITALS — BP 138/70 | HR 66 | Temp 97.8°F | Resp 20 | Ht 69.0 in | Wt 181.0 lb

## 2022-08-19 DIAGNOSIS — H1013 Acute atopic conjunctivitis, bilateral: Secondary | ICD-10-CM | POA: Diagnosis not present

## 2022-08-19 MED ORDER — OLOPATADINE HCL 0.2 % OP SOLN: 1.0000 [drp] | Freq: Two times a day (BID) | OPHTHALMIC | 0 refills | Status: DC

## 2022-08-19 NOTE — Progress Notes (Signed)
Assessment & Plan:  1. Allergic conjunctivitis of both eyes Education provided on allergic conjunctivitis. - Olopatadine HCl 0.2 % SOLN; Apply 1 drop to eye in the morning and at bedtime.  Dispense: 2.5 mL; Refill: 0   Follow up plan: Return if symptoms worsen or fail to improve.  Hendricks Limes, MSN, APRN, FNP-C  Subjective:  HPI: Nathan Reilly is a 82 y.o. male presenting on 08/19/2022 for Eye Problem (Bilateral eye itching, redness and swelling )  Patient reports bilateral eye itching, watering, and redness that started last week. Drainage is clear. It got worse three days ago. He has been using systane gel eye drops and lid-scrub wipes, which has not been effective.     ROS: Negative unless specifically indicated above in HPI.   Relevant past medical history reviewed and updated as indicated.   Allergies and medications reviewed and updated.   Current Outpatient Medications:    doxazosin (CARDURA) 4 MG tablet, TAKE 1 TABLET BY MOUTH EVERY DAY, Disp: 90 tablet, Rfl: 3   fluticasone (FLONASE) 50 MCG/ACT nasal spray, Place 2 sprays into both nostrils daily. Annual appt due in June must see provider for future refills, Disp: 48 g, Rfl: 3   levothyroxine (SYNTHROID) 50 MCG tablet, TAKE 1 TABLET BY MOUTH EVERY DAY, Disp: 90 tablet, Rfl: 2   lisinopril (ZESTRIL) 10 MG tablet, TAKE 1 TABLET BY MOUTH EVERY DAY, Disp: 90 tablet, Rfl: 2   Polyethyl Glycol-Propyl Glycol 0.4-0.3 % GEL, Apply 1 drop to eye daily as needed., Disp: , Rfl:    Wheat Dextrin (BENEFIBER DRINK MIX PO), Take by mouth. Prebiotic fiber supplement.  2 tablespoons twice daily, Disp: , Rfl:    diclofenac (VOLTAREN) 75 MG EC tablet, Take 75 mg by mouth 2 (two) times daily., Disp: , Rfl:    meclizine (ANTIVERT) 12.5 MG tablet, , Disp: , Rfl:   No Known Allergies  Objective:   BP 138/70   Pulse 66   Temp 97.8 F (36.6 C)   Resp 20   Ht '5\' 9"'$  (1.753 m)   Wt 181 lb (82.1 kg)   BMI 26.73 kg/m    Physical  Exam Vitals reviewed.  Constitutional:      General: He is not in acute distress.    Appearance: Normal appearance. He is not ill-appearing, toxic-appearing or diaphoretic.  HENT:     Head: Normocephalic and atraumatic.  Eyes:     General: Lids are normal. No scleral icterus.       Right eye: Discharge (watery) present.        Left eye: Discharge (watery) present.    Conjunctiva/sclera:     Right eye: Right conjunctiva is injected. No exudate.    Left eye: Left conjunctiva is injected. No exudate. Cardiovascular:     Rate and Rhythm: Normal rate.  Pulmonary:     Effort: Pulmonary effort is normal. No respiratory distress.  Musculoskeletal:        General: Normal range of motion.     Cervical back: Normal range of motion.  Skin:    General: Skin is warm and dry.  Neurological:     Mental Status: He is alert and oriented to person, place, and time. Mental status is at baseline.  Psychiatric:        Mood and Affect: Mood normal.        Behavior: Behavior normal.        Thought Content: Thought content normal.        Judgment:  Judgment normal.

## 2022-08-23 DIAGNOSIS — H10013 Acute follicular conjunctivitis, bilateral: Secondary | ICD-10-CM | POA: Diagnosis not present

## 2022-08-26 DIAGNOSIS — L578 Other skin changes due to chronic exposure to nonionizing radiation: Secondary | ICD-10-CM | POA: Diagnosis not present

## 2022-08-26 DIAGNOSIS — Z85828 Personal history of other malignant neoplasm of skin: Secondary | ICD-10-CM | POA: Diagnosis not present

## 2022-08-26 DIAGNOSIS — Z86018 Personal history of other benign neoplasm: Secondary | ICD-10-CM | POA: Diagnosis not present

## 2022-08-26 DIAGNOSIS — C4361 Malignant melanoma of right upper limb, including shoulder: Secondary | ICD-10-CM | POA: Diagnosis not present

## 2022-08-26 DIAGNOSIS — L570 Actinic keratosis: Secondary | ICD-10-CM | POA: Diagnosis not present

## 2022-08-26 DIAGNOSIS — D485 Neoplasm of uncertain behavior of skin: Secondary | ICD-10-CM | POA: Diagnosis not present

## 2022-08-26 DIAGNOSIS — L905 Scar conditions and fibrosis of skin: Secondary | ICD-10-CM | POA: Diagnosis not present

## 2022-08-26 DIAGNOSIS — D225 Melanocytic nevi of trunk: Secondary | ICD-10-CM | POA: Diagnosis not present

## 2022-08-26 DIAGNOSIS — L821 Other seborrheic keratosis: Secondary | ICD-10-CM | POA: Diagnosis not present

## 2022-08-26 DIAGNOSIS — L986 Other infiltrative disorders of the skin and subcutaneous tissue: Secondary | ICD-10-CM | POA: Diagnosis not present

## 2022-09-02 DIAGNOSIS — D2261 Melanocytic nevi of right upper limb, including shoulder: Secondary | ICD-10-CM | POA: Diagnosis not present

## 2022-09-02 DIAGNOSIS — C4361 Malignant melanoma of right upper limb, including shoulder: Secondary | ICD-10-CM | POA: Diagnosis not present

## 2022-09-02 DIAGNOSIS — D485 Neoplasm of uncertain behavior of skin: Secondary | ICD-10-CM | POA: Diagnosis not present

## 2022-09-26 DIAGNOSIS — H10433 Chronic follicular conjunctivitis, bilateral: Secondary | ICD-10-CM | POA: Diagnosis not present

## 2022-09-26 DIAGNOSIS — H00022 Hordeolum internum right lower eyelid: Secondary | ICD-10-CM | POA: Diagnosis not present

## 2022-09-30 DIAGNOSIS — D485 Neoplasm of uncertain behavior of skin: Secondary | ICD-10-CM | POA: Diagnosis not present

## 2022-09-30 DIAGNOSIS — L570 Actinic keratosis: Secondary | ICD-10-CM | POA: Diagnosis not present

## 2022-09-30 DIAGNOSIS — Z5189 Encounter for other specified aftercare: Secondary | ICD-10-CM | POA: Diagnosis not present

## 2022-10-10 DIAGNOSIS — H524 Presbyopia: Secondary | ICD-10-CM | POA: Diagnosis not present

## 2022-10-16 DIAGNOSIS — R197 Diarrhea, unspecified: Secondary | ICD-10-CM | POA: Diagnosis not present

## 2022-10-18 ENCOUNTER — Ambulatory Visit: Payer: PPO | Admitting: Family Medicine

## 2022-10-28 DIAGNOSIS — L570 Actinic keratosis: Secondary | ICD-10-CM | POA: Diagnosis not present

## 2022-11-13 DIAGNOSIS — M1612 Unilateral primary osteoarthritis, left hip: Secondary | ICD-10-CM | POA: Diagnosis not present

## 2022-11-13 DIAGNOSIS — M25552 Pain in left hip: Secondary | ICD-10-CM | POA: Diagnosis not present

## 2022-11-14 ENCOUNTER — Encounter: Payer: Self-pay | Admitting: Internal Medicine

## 2022-11-15 NOTE — Telephone Encounter (Signed)
Pt has been scheduled for an visit

## 2022-11-20 ENCOUNTER — Encounter: Payer: Self-pay | Admitting: Internal Medicine

## 2022-11-20 ENCOUNTER — Ambulatory Visit (INDEPENDENT_AMBULATORY_CARE_PROVIDER_SITE_OTHER): Payer: Medicare HMO | Admitting: Internal Medicine

## 2022-11-20 VITALS — BP 130/80 | HR 55 | Temp 98.0°F | Ht 69.0 in | Wt 174.0 lb

## 2022-11-20 DIAGNOSIS — Z01810 Encounter for preprocedural cardiovascular examination: Secondary | ICD-10-CM

## 2022-11-20 NOTE — Progress Notes (Signed)
   Subjective:   Patient ID: Nathan Reilly, male    DOB: 1941-01-04, 82 y.o.   MRN: [id number]  HPI The patient is an 81 YO man coming in for hip replacement. Denies chest pains  PMh, [facility], social history reviewed and updated  Review of Systems  Constitutional: Negative.   HENT: Negative.    Eyes: Negative.   Respiratory:  Negative for cough, chest tightness and shortness of breath.   Cardiovascular:  Negative for chest pain, palpitations and leg swelling.  Gastrointestinal:  Negative for abdominal distention, abdominal pain, constipation, diarrhea, nausea and vomiting.  Musculoskeletal:  Positive for arthralgias.  Skin: Negative.   Neurological: Negative.   Psychiatric/Behavioral: Negative.      Objective:  Physical Exam Constitutional:      Appearance: He is well-developed.  HENT:     Head: Normocephalic and atraumatic.  Cardiovascular:     Rate and Rhythm: Normal rate and regular rhythm.  Pulmonary:     Effort: Pulmonary effort is normal. No respiratory distress.     Breath sounds: Normal breath sounds. No wheezing or rales.  Abdominal:     General: Bowel sounds are normal. There is no distension.     Palpations: Abdomen is soft.     Tenderness: There is no abdominal tenderness. There is no rebound.  Musculoskeletal:        General: Tenderness present.     Cervical back: Normal range of motion.  Skin:    General: Skin is warm and dry.  Neurological:     Mental Status: He is alert and oriented to person, place, and time.     Coordination: Coordination normal.     Vitals:   11/20/22 1008  BP: 130/80  Pulse: (!) 55  Temp: 98 F (36.7 C)  TempSrc: Oral  SpO2: 99%  Weight: 174 lb (78.9 kg)  Height: 5\' 9"  (1.753 m)   EKG: Rate 51, axis normal, interval normal, sinus brady, no st or t wave changes, no significant change compared to prior rate improved compared to prior  Assessment & Plan:  Visit time 20 minutes in face to face communication with patient and  coordination of care, additional 10 minutes spent in record review, coordination or care, ordering tests, communicating/referring to other healthcare professionals, documenting in medical records all on the same day of the visit for total time 30 minutes spent on the visit.

## 2022-11-20 NOTE — Patient Instructions (Signed)
You are cleared without further testing to have the hip replacement.

## 2022-11-21 DIAGNOSIS — Z01810 Encounter for preprocedural cardiovascular examination: Secondary | ICD-10-CM | POA: Insufficient documentation

## 2022-11-21 NOTE — Assessment & Plan Note (Signed)
EKG done which is stable from prior. BP at goal. Counseled about need for bowel regimen after surgery due to opioids. No labs needed. Low risk for upcoming hip replacement and is cleared for surgery without further need. No new chest pains or SOB. Active to his abilities with hip pain.

## 2022-11-22 ENCOUNTER — Encounter (HOSPITAL_COMMUNITY): Payer: Self-pay

## 2022-12-16 ENCOUNTER — Other Ambulatory Visit: Payer: Self-pay | Admitting: Internal Medicine

## 2022-12-26 DIAGNOSIS — D225 Melanocytic nevi of trunk: Secondary | ICD-10-CM | POA: Diagnosis not present

## 2022-12-26 DIAGNOSIS — Z85828 Personal history of other malignant neoplasm of skin: Secondary | ICD-10-CM | POA: Diagnosis not present

## 2022-12-26 DIAGNOSIS — L821 Other seborrheic keratosis: Secondary | ICD-10-CM | POA: Diagnosis not present

## 2022-12-26 DIAGNOSIS — L820 Inflamed seborrheic keratosis: Secondary | ICD-10-CM | POA: Diagnosis not present

## 2022-12-26 DIAGNOSIS — D3611 Benign neoplasm of peripheral nerves and autonomic nervous system of face, head, and neck: Secondary | ICD-10-CM | POA: Diagnosis not present

## 2022-12-26 DIAGNOSIS — L578 Other skin changes due to chronic exposure to nonionizing radiation: Secondary | ICD-10-CM | POA: Diagnosis not present

## 2022-12-26 DIAGNOSIS — Z86018 Personal history of other benign neoplasm: Secondary | ICD-10-CM | POA: Diagnosis not present

## 2022-12-26 DIAGNOSIS — Z85820 Personal history of malignant melanoma of skin: Secondary | ICD-10-CM | POA: Diagnosis not present

## 2022-12-26 DIAGNOSIS — D485 Neoplasm of uncertain behavior of skin: Secondary | ICD-10-CM | POA: Diagnosis not present

## 2022-12-29 NOTE — Patient Instructions (Signed)
SURGICAL WAITING ROOM VISITATION Patients having surgery or a procedure may have no more than 2 support people in the waiting area - these visitors may rotate in the visitor waiting room.   Due to an increase in RSV and influenza rates and associated hospitalizations, children ages 51 and under may not visit patients in [facility] hospitals. If the patient needs to stay at the hospital during part of their recovery, the visitor guidelines for inpatient rooms apply.  PRE-OP VISITATION  Pre-op nurse will coordinate an appropriate time for 1 support person to accompany the patient in pre-op.  This support person may not rotate.  This visitor will be contacted when the time is appropriate for the visitor to come back in the pre-op area.  Please refer to the [facility] website for the visitor guidelines for Inpatients (after your surgery is over and you are in a regular room).  You are not required to quarantine at this time prior to your surgery. However, you must do this: Hand Hygiene often Do NOT share personal items Notify your provider if you are in close contact with someone who has COVID or you develop fever 100.4 or greater, new onset of sneezing, cough, sore throat, shortness of breath or body aches.  If you test positive for Covid or have been in contact with anyone that has tested positive in the last 10 days please notify you surgeon.    Your procedure is scheduled on:  Tuesday  January 14, 2023  Report to [facility] Main Entrance: [facility] [facility] entrance where the [employer] is available.   Report to admitting at: 11:45    AM  Call this number if you have any questions or problems the morning of surgery [phone]  Do not eat food after Midnight the night prior to your surgery/procedure.  After Midnight you may have the following liquids until   11:15  AM  DAY OF SURGERY  Clear Liquid Diet Water Black Coffee (sugar ok, NO MILK/CREAM OR CREAMERS)  Tea (sugar ok, NO  MILK/CREAM OR CREAMERS) regular and decaf                             Plain Jell-O  with no fruit (NO RED)                                           Fruit ices (not with fruit pulp, NO RED)                                     Popsicles (NO RED)                                                                  Juice: NO CITRUS JUICES: only apple, WHITE grape, WHITE cranberry Sports drinks like Gatorade or Powerade (NO RED)                     The day of surgery:  Drink ONE (1) Pre-Surgery Clear Ensure at  11:15 AM  the morning of surgery. Drink in one sitting. Do not sip.  This drink was given to you during your hospital pre-op appointment visit. Nothing else to drink after completing the Pre-Surgery Clear Ensure  : No candy, chewing gum or throat lozenges.    FOLLOW  ANY ADDITIONAL PRE OP INSTRUCTIONS YOU RECEIVED FROM YOUR SURGEON'S OFFICE!!!   Oral Hygiene is also important to reduce your risk of infection.        Remember - BRUSH YOUR TEETH THE MORNING OF SURGERY WITH YOUR REGULAR TOOTHPASTE  Do NOT smoke after Midnight the night before surgery.  Take ONLY these medicines the morning of surgery with A SIP OF WATER: levothyroxine (Synthroid), doxazosin (Cardura)                    You may not have any metal on your body including  jewelry, and body piercing  Do not wear lotions, powders, cologne, or deodorant  Men may shave face and neck.  Contacts, Hearing Aids, dentures or bridgework may not be worn into surgery. DENTURES WILL BE REMOVED PRIOR TO SURGERY PLEASE DO NOT APPLY "Poly grip" OR ADHESIVES!!!  You may bring a small overnight bag with you on the day of surgery, only pack items that are not valuable. [facility] IS NOT RESPONSIBLE   FOR VALUABLES THAT ARE LOST OR STOLEN.    Do not bring your home medications to the hospital. The Pharmacy will dispense medications listed on your medication list to you during your admission in the Hospital.  Special Instructions:  Bring a copy of your healthcare power of attorney and living will documents the day of surgery, if you wish to have them scanned into your [facility] Records- EPIC  Please read over the following fact sheets you were given: IF YOU HAVE QUESTIONS ABOUT YOUR PRE-OP INSTRUCTIONS, PLEASE CALL [phone].     Pre-operative 5 CHG Bath Instructions   You can play a key role in reducing the risk of infection after surgery. Your skin needs to be as free of germs as possible. You can reduce the number of germs on your skin by washing with CHG (chlorhexidine gluconate) soap before surgery. CHG is an antiseptic soap that kills germs and continues to kill germs even after washing.   DO NOT use if you have an allergy to chlorhexidine/CHG or antibacterial soaps. If your skin becomes reddened or irritated, stop using the CHG and notify one of our RNs at [phone]  Please shower with the CHG soap starting 4 days before surgery using the following schedule: START SHOWERS ON FRIDAY  January 10, 2023  Please keep in mind the following:  DO NOT shave, including legs and underarms, starting the day of your first shower.   You may shave your face at any point before/day of surgery.   Place clean sheets on your bed the day you start using CHG soap. Use a clean washcloth (not used since being washed) for each shower. DO NOT sleep with pets once you start using the CHG.   CHG Shower Instructions:  If you choose to wash your hair and private area, wash first with your normal shampoo/soap.  After you use shampoo/soap, rinse your hair and body thoroughly to remove shampoo/soap residue.  Turn the water OFF and apply about 3 tablespoons (45 ml) of CHG soap to a CLEAN washcloth.  Apply CHG soap ONLY FROM YOUR NECK DOWN TO YOUR  TOES (washing for 3-5 minutes)  DO NOT use CHG soap on face, private areas, open wounds, or sores.  Pay special attention to the area where your surgery is being performed.  If you are having back surgery, having someone wash your back for you may be helpful.  Wait 2 minutes after CHG soap is applied, then you may rinse off the CHG soap.  Pat dry with a clean towel  Put on clean clothes/pajamas   If you choose to wear lotion, please use ONLY the CHG-compatible lotions on the back of this paper.     Additional instructions for the day of surgery: DO NOT APPLY any lotions, deodorants, cologne, or perfumes.   Put on clean/comfortable clothes.  Brush your teeth.  Ask your nurse before applying any prescription medications to the skin.      CHG Compatible Lotions   Aveeno Moisturizing lotion  Cetaphil Moisturizing Cream  Cetaphil Moisturizing Lotion  Clairol Herbal Essence Moisturizing Lotion, Dry Skin  Clairol Herbal Essence Moisturizing Lotion, Extra Dry Skin  Clairol Herbal Essence Moisturizing Lotion, Normal Skin  Curel Age Defying Therapeutic Moisturizing Lotion with Alpha Hydroxy  Curel Extreme Care Body Lotion  Curel Soothing Hands Moisturizing Hand Lotion  Curel Therapeutic Moisturizing Cream, Fragrance-Free  Curel Therapeutic Moisturizing Lotion, Fragrance-Free  Curel Therapeutic Moisturizing Lotion, Original Formula  Eucerin Daily Replenishing Lotion  Eucerin Dry Skin Therapy Plus Alpha Hydroxy Crme  Eucerin Dry Skin Therapy Plus Alpha Hydroxy Lotion  Eucerin Original Crme  Eucerin Original Lotion  Eucerin Plus Crme Eucerin Plus Lotion  Eucerin TriLipid Replenishing Lotion  Keri Anti-Bacterial Hand Lotion  Keri Deep Conditioning Original Lotion Dry Skin Formula Softly Scented  Keri Deep Conditioning Original Lotion, Fragrance Free Sensitive Skin Formula  Keri Lotion Fast Absorbing Fragrance Free Sensitive Skin Formula  Keri Lotion Fast Absorbing Softly Scented Dry  Skin Formula  Keri Original Lotion  Keri Skin Renewal Lotion Keri Silky Smooth Lotion  Keri Silky Smooth Sensitive Skin Lotion  Nivea Body Creamy Conditioning Oil  Nivea Body Extra Enriched Lotion  Nivea Body Original Lotion  Nivea Body Sheer Moisturizing Lotion Nivea Crme  Nivea Skin Firming Lotion  NutraDerm 30 Skin Lotion  NutraDerm Skin Lotion  NutraDerm Therapeutic Skin Cream  NutraDerm Therapeutic Skin Lotion  ProShield Protective Hand Cream  Provon moisturizing lotion   FAILURE TO FOLLOW THESE INSTRUCTIONS MAY RESULT IN THE CANCELLATION OF YOUR SURGERY  PATIENT SIGNATURE_________________________________  NURSE SIGNATURE__________________________________  ________________________________________________________________________       Rogelia Mire    An incentive spirometer is a tool that can help keep your lungs clear and active. This tool measures how well you are filling your lungs with each breath. Taking  long deep breaths may help reverse or decrease the chance of developing breathing (pulmonary) problems (especially infection) following: A long period of time when you are unable to move or be active. BEFORE THE PROCEDURE  If the spirometer includes an indicator to show your best effort, your nurse or respiratory therapist will set it to a desired goal. If possible, sit up straight or lean slightly forward. Try not to slouch. Hold the incentive spirometer in an upright position. INSTRUCTIONS FOR USE  Sit on the edge of your bed if possible, or sit up as far as you can in bed or on a chair. Hold the incentive spirometer in an upright position. Breathe out normally. Place the mouthpiece in your mouth and seal your lips tightly around it. Breathe in slowly and as deeply as possible, raising the piston or the ball toward the top of the column. Hold your breath for 3-5 seconds or for as long as possible. Allow the piston or ball to fall to the bottom of the  column. Remove the mouthpiece from your mouth and breathe out normally. Rest for a few seconds and repeat Steps 1 through 7 at least 10 times every 1-2 hours when you are awake. Take your time and take a few normal breaths between deep breaths. The spirometer may include an indicator to show your best effort. Use the indicator as a goal to work toward during each repetition. After each set of 10 deep breaths, practice coughing to be sure your lungs are clear. If you have an incision (the cut made at the time of surgery), support your incision when coughing by placing a pillow or rolled up towels firmly against it. Once you are able to get out of bed, walk around indoors and cough well. You may stop using the incentive spirometer when instructed by your caregiver.  RISKS AND COMPLICATIONS Take your time so you do not get dizzy or light-headed. If you are in pain, you may need to take or ask for pain medication before doing incentive spirometry. It is harder to take a deep breath if you are having pain. AFTER USE Rest and breathe slowly and easily. It can be helpful to keep track of a log of your progress. Your caregiver can provide you with a simple table to help with this. If you are using the spirometer at home, follow these instructions: SEEK MEDICAL CARE IF:  You are having difficultly using the spirometer. You have trouble using the spirometer as often as instructed. Your pain medication is not giving enough relief while using the spirometer. You develop fever of 100.5 F (38.1 C) or higher.                                                                                                    SEEK IMMEDIATE MEDICAL CARE IF:  You cough up bloody sputum that had not been present before. You develop fever of 102 F (38.9 C) or greater. You develop worsening pain at or near the incision site. MAKE SURE YOU:  Understand these instructions. Will watch your condition. Will  get help right away if  you are not doing well or get worse. Document Released: 10/07/2006 Document Revised: 08/19/2011 Document Reviewed: 12/08/2006 [facility] Patient Information 2014 [place], [place].     WHAT IS A BLOOD TRANSFUSION? Blood Transfusion Information  A transfusion is the replacement of blood or some of its parts. Blood is made up of multiple cells which provide different functions. Red blood cells carry oxygen and are used for blood loss replacement. White blood cells fight against infection. Platelets control bleeding. Plasma helps clot blood. Other blood products are available for specialized needs, such as hemophilia or other clotting disorders. BEFORE THE TRANSFUSION  Who gives blood for transfusions?  Healthy volunteers who are fully evaluated to make sure their blood is safe. This is blood bank blood. Transfusion therapy is the safest it has ever been in the practice of medicine. Before blood is taken from a donor, a complete history is taken to make sure that person has no history of diseases nor engages in risky social behavior (examples are intravenous drug use or sexual activity with multiple partners). The donor's travel history is screened to minimize risk of transmitting infections, such as malaria. The donated blood is tested for signs of infectious diseases, such as HIV and hepatitis. The blood is then tested to be sure it is compatible with you in order to minimize the chance of a transfusion reaction. If you or a relative donates blood, this is often done in anticipation of surgery and is not appropriate for emergency situations. It takes many days to process the donated blood. RISKS AND COMPLICATIONS Although transfusion therapy is very safe and saves many lives, the main dangers of transfusion include:  Getting an infectious disease. Developing a transfusion reaction. This is an allergic reaction to something in the blood you were given. Every precaution is taken to prevent this. The  decision to have a blood transfusion has been considered carefully by your caregiver before blood is given. Blood is not given unless the benefits outweigh the risks. AFTER THE TRANSFUSION Right after receiving a blood transfusion, you will usually feel much better and more energetic. This is especially true if your red blood cells have gotten low (anemic). The transfusion raises the level of the red blood cells which carry oxygen, and this usually causes an energy increase. The nurse administering the transfusion will monitor you carefully for complications. HOME CARE INSTRUCTIONS  No special instructions are needed after a transfusion. You may find your energy is better. Speak with your caregiver about any limitations on activity for underlying diseases you may have. SEEK MEDICAL CARE IF:  Your condition is not improving after your transfusion. You develop redness or irritation at the intravenous (IV) site. SEEK IMMEDIATE MEDICAL CARE IF:  Any of the following symptoms occur over the next 12 hours: Shaking chills. You have a temperature by mouth above 102 F (38.9 C), not controlled by medicine. Chest, back, or muscle pain. People around you feel you are not acting correctly or are confused. Shortness of breath or difficulty breathing. Dizziness and fainting. You get a rash or develop hives. You have a decrease in urine output. Your urine turns a dark color or changes to pink, red, or brown. Any of the following symptoms occur over the next 10 days: You have a temperature by mouth above 102 F (38.9 C), not controlled by medicine. Shortness of breath. Weakness after normal activity. The white part of the eye turns yellow (jaundice). You have a decrease in  the amount of urine or are urinating less often. Your urine turns a dark color or changes to pink, red, or brown. Document Released: 05/24/2000 Document Revised: 08/19/2011 Document Reviewed: 01/11/2008 [facility] Patient Information  2014 [place], [place].  _______________________________________________________________________

## 2022-12-29 NOTE — Progress Notes (Signed)
COVID Vaccine received:  []  No [x]  Yes Date of any COVID positive Test in last 90 days:  PCP - Hillard Danker,  MD  LOV 11-20-2022 Cardiologist -   Chest x-ray - 01-21-2021  1v  Epic scan EKG -  11-20-2022  Epic Stress Test -  ECHO -  Cardiac Cath -   PCR screen: [x]  Ordered & Completed           []   No Order but Needs PROFEND           []   N/A for this surgery  Surgery Plan:  []  Ambulatory                            [x]  Outpatient in bed                            []  Admit  Anesthesia:    []  General  [x]  Spinal                           []   Choice []   MAC  Pacemaker / ICD device [x]  No []  Yes   Spinal Cord Stimulator:[x]  No []  Yes       History of Sleep Apnea? [x]  No []  Yes   CPAP used?- [x]  No []  Yes    Does the patient monitor blood sugar?          []  No []  Yes  [x]  N/A  Patient has: [x]  NO Hx DM   []  Pre-DM                 []  DM1  []   DM2 Does patient have a [employer] or Dexacom? []  No []  Yes   Fasting Blood Sugar Ranges-  Checks Blood Sugar _____ times a day  Blood Thinner / Instructions: none Aspirin Instructions:  none  ERAS Protocol Ordered: []  No  [x]  Yes PRE-SURGERY [x]  ENSURE  []  G2  Patient is to be NPO after: 11:15  am  Comments: Patient was given the 5 CHG shower / bath instructions for THA surgery along with 2 bottles of the CHG soap. Patient will start this on:  Friday 01-10-2023 All questions were asked and answered, Patient voiced understanding of this process.   Activity level: Patient is able / unable to climb a flight of stairs without difficulty; []  No CP  []  No SOB, but would have ___   Patient can / can not perform ADLs without assistance.   Anesthesia review: HTN, Bradycardia,  Patient denies shortness of breath, fever, cough and chest pain at PAT appointment.  Patient verbalized understanding and agreement to the Pre-Surgical Instructions that were given to them at this PAT appointment. Patient was also educated of the need to review  these PAT instructions again prior to his surgery.I reviewed the appropriate phone numbers to call if they have any and questions or concerns.

## 2022-12-31 NOTE — Progress Notes (Signed)
COVID Vaccine received:  []  No [x]  Yes Date of any COVID positive Test in last 90 days: no PCP - Hillard Danker MD Cardiologist - no  Chest x-ray - 01/21/21 Epic EKG -  11/20/22 Epic Stress Test -  ECHO -  Cardiac Cath -   Bowel Prep - [x]  No  []   Yes ______  Pacemaker / ICD device [x]  No []  Yes   Spinal Cord Stimulator:[x]  No []  Yes       History of Sleep Apnea? [x]  No []  Yes   CPAP used?- [x]  No []  Yes    Does the patient monitor blood sugar?          []  No []  Yes  [x]  N/A  Patient has: [x]  NO Hx DM   []  Pre-DM                 []  DM1  []   DM2 Does patient have a [employer] or Dex[employer]om? []  No []  Yes   Fasting Blood Sugar Ranges-  Checks Blood Sugar _____ times a day  GLP1 agonist / usual dose - No GLP1 instructions:  SGLT-2 inhibitors / usual dose - No SGLT-2 instructions:   Blood Thinner / Instructions:No Aspirin Instructions:No  Comments:   Activity level: Patient is able to climb a flight of stairs without difficulty; [x]  No CP  [x]  No SOB, but would have ___   Patient can perform ADLs without assistance.   Anesthesia review: HTN, bradycardia  Patient denies shortness of breath, fever, cough and chest pain at PAT appointment.  Patient verbalized understanding and agreement to the Pre-Surgical Instructions that were given to them at this PAT appointment. Patient was also educated of the need to review these PAT instructions again prior to his/her surgery.I reviewed the appropriate phone numbers to call if they have any and questions or concerns.

## 2022-12-31 NOTE — Patient Instructions (Signed)
SURGICAL WAITING ROOM VISITATION  Patients having surgery or a procedure may have no more than 2 support people in the waiting area - these visitors may rotate.    Children under the age of 19 must have an adult with them who is not the patient.  Due to an increase in RSV and influenza rates and associated hospitalizations, children ages 65 and under may not visit patients in [facility] hospitals.  If the patient needs to stay at the hospital during part of their recovery, the visitor guidelines for inpatient rooms apply. Pre-op nurse will coordinate an appropriate time for 1 support person to accompany patient in pre-op.  This support person may not rotate.    Please refer to the [facility] website for the visitor guidelines for Inpatients (after your surgery is over and you are in a regular room).       Your procedure is scheduled on: 01/14/23   Report to [facility] Main Entrance    Report to admitting at  1145 AM   Call this number if you have problems the morning of surgery [phone]   Do not eat food :After Midnight.   After Midnight you may have the following liquids until 11:15 AM DAY OF SURGERY  Water Non-Citrus Juices (without pulp, NO RED-Apple, White grape, White cranberry) Black Coffee (NO MILK/CREAM OR CREAMERS, sugar ok)  Clear Tea (NO MILK/CREAM OR CREAMERS, sugar ok) regular and decaf                             Plain Jell-O (NO RED)                                           Fruit ices (not with fruit pulp, NO RED)                                     Popsicles (NO RED)                                                               Sports drinks like Gatorade (NO RED)                  The day of surgery:  Drink ONE (1) Pre-Surgery Clear Ensure at 11:15 AM the morning of surgery. Drink in one sitting. Do not sip.  This drink was given to you during your hospital  pre-op appointment visit. Nothing else to drink after completing the  Pre-Surgery Clear  Ensure     Oral Hygiene is also important to reduce your risk of infection.                                    Remember - BRUSH YOUR TEETH THE MORNING OF SURGERY WITH YOUR REGULAR TOOTHPASTE  DENTURES WILL BE REMOVED PRIOR TO SURGERY PLEASE DO NOT APPLY "Poly grip" OR ADHESIVES!!!   Do NOT smoke after Midnight   Take these medicines the morning of surgery Synthroid,  Cardura              You may not have any metal on your body including hair pins, jewelry, and body piercing             Do not wear make-up, lotions, powders, perfumes/cologne, or deodorant              Men may shave face and neck.   Do not bring valuables to the hospital. [facility] IS NOT             RESPONSIBLE   FOR VALUABLES.   Contacts, glasses, dentures or bridgework may not be worn into surgery.   Bring small overnight bag day of surgery.   DO NOT BRING YOUR HOME MEDICATIONS TO THE HOSPITAL. PHARMACY WILL DISPENSE MEDICATIONS LISTED ON YOUR MEDICATION LIST TO YOU DURING YOUR ADMISSION IN THE HOSPITAL!    Patients discharged on the day of surgery will not be allowed to drive home.  Someone NEEDS to stay with you for the first 24 hours after anesthesia.   Special Instructions: Bring a copy of your healthcare power of attorney and living will documents the day of surgery if you haven't scanned them before.              Please read over the following fact sheets you were given: IF YOU HAVE QUESTIONS ABOUT YOUR PRE-OP INSTRUCTIONS PLEASE CALL [phone] Nathan Reilly   If you received a COVID test during your pre-op visit  it is requested that you wear a mask when out in public, stay away from anyone that may not be feeling well and notify your surgeon if you develop symptoms. If you test positive for Covid or have been in contact with anyone that has tested positive in the last 10 days please notify you surgeon.      Pre-operative 5 CHG Reilly Instructions   You can play a key role in reducing the risk of infection  after surgery. Your skin needs to be as free of germs as possible. You can reduce the number of germs on your skin by washing with CHG (chlorhexidine gluconate) soap before surgery. CHG is an antiseptic soap that kills germs and continues to kill germs even after washing.   DO NOT use if you have an allergy to chlorhexidine/CHG or antibacterial soaps. If your skin becomes reddened or irritated, stop using the CHG and notify one of our RNs at [phone].   Please shower with the CHG soap starting 4 days before surgery using the following schedule:     Please keep in mind the following:  DO NOT shave, including legs and underarms, starting the day of your first shower.   You may shave your face at any point before/day of surgery.  Place clean sheets on your bed the day you start using CHG soap. Use a clean washcloth (not used since being washed) for each shower. DO NOT sleep with pets once you start using the CHG.   CHG Shower Instructions:  If you choose to wash your hair and private area, wash first with your normal shampoo/soap.  After you use shampoo/soap, rinse your hair and body thoroughly to remove shampoo/soap residue.  Turn the water OFF and apply about 3 tablespoons (45 ml) of CHG soap to a CLEAN washcloth.  Apply CHG soap ONLY FROM YOUR NECK DOWN TO YOUR TOES (washing for 3-5 minutes)  DO NOT use CHG soap on face, private areas, open wounds, or sores.  Pay special  attention to the area where your surgery is being performed.  If you are having back surgery, having someone wash your back for you may be helpful. Wait 2 minutes after CHG soap is applied, then you may rinse off the CHG soap.  Pat dry with a clean towel  Put on clean clothes/pajamas   If you choose to wear lotion, please use ONLY the CHG-compatible lotions on the back of this paper.     Additional instructions for the day of surgery: DO NOT APPLY any lotions, deodorants, cologne, or perfumes.   Put on  clean/comfortable clothes.  Brush your teeth.  Ask your nurse before applying any prescription medications to the skin.      CHG Compatible Lotions   Aveeno Moisturizing lotion  Cetaphil Moisturizing Cream  Cetaphil Moisturizing Lotion  Clairol Herbal Essence Moisturizing Lotion, Dry Skin  Clairol Herbal Essence Moisturizing Lotion, Extra Dry Skin  Clairol Herbal Essence Moisturizing Lotion, Normal Skin  Curel Age Defying Therapeutic Moisturizing Lotion with Alpha Hydroxy  Curel Extreme Care Body Lotion  Curel Soothing Hands Moisturizing Hand Lotion  Curel Therapeutic Moisturizing Cream, Fragrance-Free  Curel Therapeutic Moisturizing Lotion, Fragrance-Free  Curel Therapeutic Moisturizing Lotion, Original Formula  Eucerin Daily Replenishing Lotion  Eucerin Dry Skin Therapy Plus Alpha Hydroxy Crme  Eucerin Dry Skin Therapy Plus Alpha Hydroxy Lotion  Eucerin Original Crme  Eucerin Original Lotion  Eucerin Plus Crme Eucerin Plus Lotion  Eucerin TriLipid Replenishing Lotion  Keri Anti-Bacterial Hand Lotion  Keri Deep Conditioning Original Lotion Dry Skin Formula Softly Scented  Keri Deep Conditioning Original Lotion, Fragrance Free Sensitive Skin Formula  Keri Lotion Fast Absorbing Fragrance Free Sensitive Skin Formula  Keri Lotion Fast Absorbing Softly Scented Dry Skin Formula  Keri Original Lotion  Keri Skin Renewal Lotion Keri Silky Smooth Lotion  Keri Silky Smooth Sensitive Skin Lotion  Nivea Body Creamy Conditioning Oil  Nivea Body Extra Enriched Lotion  Nivea Body Original Lotion  Nivea Body Sheer Moisturizing Lotion Nivea Crme  Nivea Skin Firming Lotion  NutraDerm 30 Skin Lotion  NutraDerm Skin Lotion  NutraDerm Therapeutic Skin Cream  NutraDerm Therapeutic Skin Lotion  ProShield Protective Hand Cream  Provon moisturizing lotion Incentive Spirometer (Watch this video at home: [email or web address])  An incentive spirometer is a tool that  can help keep your lungs clear and active. This tool measures how well you are filling your lungs with each breath. Taking long deep breaths may help reverse or decrease the chance of developing breathing (pulmonary) problems (especially infection) following: A long period of time when you are unable to move or be active. BEFORE THE PROCEDURE  If the spirometer includes an indicator to show your best effort, your nurse or respiratory therapist will set it to a desired goal. If possible, sit up straight or lean slightly forward. Try not to slouch. Hold the incentive spirometer in an upright position. INSTRUCTIONS FOR USE  Sit on the edge of your bed if possible, or sit up as far as you can in bed or on a chair. Hold the incentive spirometer in an upright position. Breathe out normally. Place the mouthpiece in your mouth and seal your lips tightly around it. Breathe in slowly and as deeply as possible, raising the piston or the ball toward the top of the column. Hold your breath for 3-5 seconds or for as long as possible. Allow the piston or ball to fall to the bottom of the column. Remove the mouthpiece from your mouth and breathe out normally. Rest  for a few seconds and repeat Steps 1 through 7 at least 10 times every 1-2 hours when you are awake. Take your time and take a few normal breaths between deep breaths. The spirometer may include an indicator to show your best effort. Use the indicator as a goal to work toward during each repetition. After each set of 10 deep breaths, practice coughing to be sure your lungs are clear. If you have an incision (the cut made at the time of surgery), support your incision when coughing by placing a pillow or rolled up towels firmly against it. Once you are able to get out of bed, walk around indoors and cough well. You may stop using the incentive spirometer when instructed by your caregiver.  RISKS AND COMPLICATIONS Take your time so you do not get dizzy or  light-headed. If you are in pain, you may need to take or ask for pain medication before doing incentive spirometry. It is harder to take a deep breath if you are having pain. AFTER USE Rest and breathe slowly and easily. It can be helpful to keep track of a log of your progress. Your caregiver can provide you with a simple table to help with this. If you are using the spirometer at home, follow these instructions: SEEK MEDICAL CARE IF:  You are having difficultly using the spirometer. You have trouble using the spirometer as often as instructed. Your pain medication is not giving enough relief while using the spirometer. You develop fever of 100.5 F (38.1 C) or higher. SEEK IMMEDIATE MEDICAL CARE IF:  You cough up bloody sputum that had not been present before. You develop fever of 102 F (38.9 C) or greater. You develop worsening pain at or near the incision site. MAKE SURE YOU:  Understand these instructions. Will watch your condition. Will get help right away if you are not doing well or get worse. Document Released: 10/07/2006 Document Revised: 08/19/2011 Document Reviewed: 12/08/2006 [facility] Patient Information 2014 [place], [place]. WHAT IS A BLOOD TRANSFUSION? Blood Transfusion Information  A transfusion is the replacement of blood or some of its parts. Blood is made up of multiple cells which provide different functions. Red blood cells carry oxygen and are used for blood loss replacement. White blood cells fight against infection. Platelets control bleeding. Plasma helps clot blood. Other blood products are available for specialized needs, such as hemophilia or other clotting disorders. BEFORE THE TRANSFUSION  Who gives blood for transfusions?  Healthy volunteers who are fully evaluated to make sure their blood is safe. This is blood bank blood. Transfusion therapy is the safest it has ever been in the practice of medicine. Before blood is taken from a donor, a complete  history is taken to make sure that person has no history of diseases nor engages in risky social behavior (examples are intravenous drug use or sexual activity with multiple partners). The donor's travel history is screened to minimize risk of transmitting infections, such as malaria. The donated blood is tested for signs of infectious diseases, such as HIV and hepatitis. The blood is then tested to be sure it is compatible with you in order to minimize the chance of a transfusion reaction. If you or a relative donates blood, this is often done in anticipation of surgery and is not appropriate for emergency situations. It takes many days to process the donated blood. RISKS AND COMPLICATIONS Although transfusion therapy is very safe and saves many lives, the main dangers of transfusion include:  Getting an infectious disease. Developing a transfusion reaction. This is an allergic reaction to something in the blood you were given. Every precaution is taken to prevent this. The decision to have a blood transfusion has been considered carefully by your caregiver before blood is given. Blood is not given unless the benefits outweigh the risks. AFTER THE TRANSFUSION Right after receiving a blood transfusion, you will usually feel much better and more energetic. This is especially true if your red blood cells have gotten low (anemic). The transfusion raises the level of the red blood cells which carry oxygen, and this usually causes an energy increase. The nurse administering the transfusion will monitor you carefully for complications. HOME CARE INSTRUCTIONS  No special instructions are needed after a transfusion. You may find your energy is better. Speak with your caregiver about any limitations on activity for underlying diseases you may have. SEEK MEDICAL CARE IF:  Your condition is not improving after your transfusion. You develop redness or irritation at the intravenous (IV) site. SEEK IMMEDIATE MEDICAL  CARE IF:  Any of the following symptoms occur over the next 12 hours: Shaking chills. You have a temperature by mouth above 102 F (38.9 C), not controlled by medicine. Chest, back, or muscle pain. People around you feel you are not acting correctly or are confused. Shortness of breath or difficulty breathing. Dizziness and fainting. You get a rash or develop hives. You have a decrease in urine output. Your urine turns a dark color or changes to pink, red, or brown. Any of the following symptoms occur over the next 10 days: You have a temperature by mouth above 102 F (38.9 C), not controlled by medicine. Shortness of breath. Weakness after normal activity. The white part of the eye turns yellow (jaundice). You have a decrease in the amount of urine or are urinating less often. Your urine turns a dark color or changes to pink, red, or brown. Document Released: 05/24/2000 Document Revised: 08/19/2011 Document Reviewed: 01/11/2008 [facility] Patient Information 2014 [place], [place].

## 2023-01-01 ENCOUNTER — Encounter (HOSPITAL_COMMUNITY)
Admission: RE | Admit: 2023-01-01 | Discharge: 2023-01-01 | Disposition: A | Discharge status: Home / Self Care | Payer: Medicare HMO | Source: Ambulatory Visit | Attending: Anesthesiology | Admitting: Anesthesiology

## 2023-01-01 DIAGNOSIS — Z01818 Encounter for other preprocedural examination: Secondary | ICD-10-CM

## 2023-01-01 DIAGNOSIS — I10 Essential (primary) hypertension: Secondary | ICD-10-CM

## 2023-01-02 ENCOUNTER — Encounter (HOSPITAL_COMMUNITY)
Admission: RE | Admit: 2023-01-02 | Discharge: 2023-01-02 | Disposition: A | Discharge status: Home / Self Care | Payer: Medicare HMO | Source: Ambulatory Visit | Attending: Orthopedic Surgery | Admitting: Orthopedic Surgery

## 2023-01-02 ENCOUNTER — Other Ambulatory Visit: Payer: Self-pay

## 2023-01-02 ENCOUNTER — Encounter (HOSPITAL_COMMUNITY): Payer: Self-pay

## 2023-01-02 DIAGNOSIS — M1612 Unilateral primary osteoarthritis, left hip: Secondary | ICD-10-CM | POA: Diagnosis not present

## 2023-01-02 DIAGNOSIS — I10 Essential (primary) hypertension: Secondary | ICD-10-CM | POA: Diagnosis not present

## 2023-01-02 DIAGNOSIS — Z01812 Encounter for preprocedural laboratory examination: Secondary | ICD-10-CM | POA: Insufficient documentation

## 2023-01-02 DIAGNOSIS — Z01818 Encounter for other preprocedural examination: Secondary | ICD-10-CM | POA: Diagnosis not present

## 2023-01-02 LAB — BASIC METABOLIC PANEL
Anion gap: 10 (ref 5–15)
BUN: 26 mg/dL — ABNORMAL HIGH (ref 8–23)
CO2: 21 mmol/L — ABNORMAL LOW (ref 22–32)
Calcium: 9 mg/dL (ref 8.9–10.3)
Chloride: 106 mmol/L (ref 98–111)
Creatinine, Ser: 1.07 mg/dL (ref 0.61–1.24)
GFR, Estimated: >60 mL/min (ref >60)
Glucose, Bld: 102 mg/dL — ABNORMAL HIGH (ref 70–99)
Potassium: 4.3 mmol/L (ref 3.5–5.1)
Sodium: 137 mmol/L (ref 135–145)

## 2023-01-02 LAB — TYPE AND SCREEN
ABO/RH(D): A POS
Antibody Screen: NEGATIVE

## 2023-01-02 LAB — CBC
HCT: 43.9 % (ref 39.0–52.0)
Hemoglobin: 14.1 g/dL (ref 13.0–17.0)
MCH: 30.4 pg (ref 26.0–34.0)
MCHC: 32.1 g/dL (ref 30.0–36.0)
MCV: 94.6 fL (ref 80.0–100.0)
Platelets: 175 10*3/uL (ref 150–400)
RBC: 4.64 MIL/uL (ref 4.22–5.81)
RDW: 13.3 % (ref 11.5–15.5)
WBC: 6.4 10*3/uL (ref 4.0–10.5)
nRBC: 0 % (ref 0.0–0.2)

## 2023-01-02 LAB — SURGICAL PCR SCREEN
MRSA, PCR: NEGATIVE
Staphylococcus aureus: NEGATIVE

## 2023-01-14 ENCOUNTER — Ambulatory Visit (HOSPITAL_COMMUNITY): Payer: Medicare HMO

## 2023-01-14 ENCOUNTER — Other Ambulatory Visit: Payer: Self-pay

## 2023-01-14 ENCOUNTER — Encounter (HOSPITAL_COMMUNITY)
Admission: RE | Disposition: A | Discharge status: Home / Self Care | Payer: Self-pay | Source: Ambulatory Visit | Attending: Orthopedic Surgery

## 2023-01-14 ENCOUNTER — Observation Stay (HOSPITAL_COMMUNITY): Payer: Medicare HMO

## 2023-01-14 ENCOUNTER — Ambulatory Visit (HOSPITAL_BASED_OUTPATIENT_CLINIC_OR_DEPARTMENT_OTHER): Payer: Medicare HMO

## 2023-01-14 ENCOUNTER — Observation Stay (HOSPITAL_COMMUNITY)
Admission: RE | Admit: 2023-01-14 | Discharge: 2023-01-15 | Disposition: A | Discharge status: Home / Self Care | Payer: Medicare HMO | Source: Ambulatory Visit | Attending: Orthopedic Surgery | Admitting: Orthopedic Surgery

## 2023-01-14 ENCOUNTER — Encounter (HOSPITAL_COMMUNITY): Payer: Self-pay | Admitting: Orthopedic Surgery

## 2023-01-14 DIAGNOSIS — Z85828 Personal history of other malignant neoplasm of skin: Secondary | ICD-10-CM | POA: Insufficient documentation

## 2023-01-14 DIAGNOSIS — Z79899 Other long term (current) drug therapy: Secondary | ICD-10-CM | POA: Insufficient documentation

## 2023-01-14 DIAGNOSIS — M1612 Unilateral primary osteoarthritis, left hip: Principal | ICD-10-CM | POA: Insufficient documentation

## 2023-01-14 DIAGNOSIS — E039 Hypothyroidism, unspecified: Secondary | ICD-10-CM | POA: Diagnosis not present

## 2023-01-14 DIAGNOSIS — Z471 Aftercare following joint replacement surgery: Secondary | ICD-10-CM | POA: Diagnosis not present

## 2023-01-14 DIAGNOSIS — Z96652 Presence of left artificial knee joint: Secondary | ICD-10-CM | POA: Insufficient documentation

## 2023-01-14 DIAGNOSIS — N401 Enlarged prostate with lower urinary tract symptoms: Secondary | ICD-10-CM | POA: Diagnosis not present

## 2023-01-14 DIAGNOSIS — Z96642 Presence of left artificial hip joint: Secondary | ICD-10-CM

## 2023-01-14 DIAGNOSIS — I10 Essential (primary) hypertension: Secondary | ICD-10-CM | POA: Insufficient documentation

## 2023-01-14 DIAGNOSIS — M1611 Unilateral primary osteoarthritis, right hip: Secondary | ICD-10-CM | POA: Diagnosis not present

## 2023-01-14 HISTORY — PX: TOTAL HIP ARTHROPLASTY: SHX124

## 2023-01-14 SURGERY — ARTHROPLASTY, HIP, TOTAL, ANTERIOR APPROACH
Anesthesia: Spinal | Site: Hip | Laterality: Left

## 2023-01-14 MED ORDER — BUPIVACAINE-EPINEPHRINE 0.25% -1:200000 IJ SOLN
INTRAMUSCULAR | Status: AC
  Filled 2023-01-14: qty 1

## 2023-01-14 MED ORDER — 0.9 % SODIUM CHLORIDE (POUR BTL) OPTIME
TOPICAL | Status: DC | PRN
  Administered 2023-01-14: 1000 mL

## 2023-01-14 MED ORDER — CEFAZOLIN SODIUM-DEXTROSE 2-4 GM/100ML-% IV SOLN
2.0000 g | Freq: Four times a day (QID) | INTRAVENOUS | Status: AC
  Administered 2023-01-14 – 2023-01-15 (×2): 2 g via INTRAVENOUS
  Filled 2023-01-14 (×2): qty 100

## 2023-01-14 MED ORDER — POVIDONE-IODINE 10 % EX SWAB: 2.0000 | Freq: Once | CUTANEOUS | Status: DC

## 2023-01-14 MED ORDER — ACETAMINOPHEN 160 MG/5ML PO SOLN: 325.0000 mg | Freq: Once | ORAL | Status: DC | PRN

## 2023-01-14 MED ORDER — HYDROMORPHONE HCL 1 MG/ML IJ SOLN: 0.5000 mg | INTRAMUSCULAR | Status: DC | PRN

## 2023-01-14 MED ORDER — FENTANYL CITRATE (PF) 100 MCG/2ML IJ SOLN
INTRAMUSCULAR | Status: AC
  Filled 2023-01-14: qty 2

## 2023-01-14 MED ORDER — PROMETHAZINE HCL 25 MG/ML IJ SOLN: 6.2500 mg | INTRAMUSCULAR | Status: DC | PRN

## 2023-01-14 MED ORDER — PROPOFOL 500 MG/50ML IV EMUL
INTRAVENOUS | Status: DC | PRN
  Administered 2023-01-14: 50 ug/kg/min via INTRAVENOUS

## 2023-01-14 MED ORDER — METOCLOPRAMIDE HCL 5 MG/ML IJ SOLN: 5.0000 mg | Freq: Three times a day (TID) | INTRAMUSCULAR | Status: DC | PRN

## 2023-01-14 MED ORDER — LEVOTHYROXINE SODIUM 50 MCG PO TABS
50.0000 ug | ORAL_TABLET | Freq: Every day | ORAL | Status: DC
  Administered 2023-01-15: 50 ug via ORAL
  Filled 2023-01-14: qty 1

## 2023-01-14 MED ORDER — DOXAZOSIN MESYLATE 4 MG PO TABS
4.0000 mg | ORAL_TABLET | Freq: Every day | ORAL | Status: DC
  Administered 2023-01-15: 4 mg via ORAL
  Filled 2023-01-14: qty 1

## 2023-01-14 MED ORDER — EPHEDRINE 5 MG/ML INJ
INTRAVENOUS | Status: AC
  Filled 2023-01-14: qty 5

## 2023-01-14 MED ORDER — TRANEXAMIC ACID-NACL 1000-0.7 MG/100ML-% IV SOLN
1000.0000 mg | Freq: Once | INTRAVENOUS | Status: AC
  Administered 2023-01-14: 1000 mg via INTRAVENOUS
  Filled 2023-01-14: qty 100

## 2023-01-14 MED ORDER — ONDANSETRON HCL 4 MG/2ML IJ SOLN: 4.0000 mg | Freq: Four times a day (QID) | INTRAMUSCULAR | Status: DC | PRN

## 2023-01-14 MED ORDER — HYDROMORPHONE HCL 1 MG/ML IJ SOLN: 0.2500 mg | INTRAMUSCULAR | Status: DC | PRN

## 2023-01-14 MED ORDER — FENTANYL CITRATE (PF) 100 MCG/2ML IJ SOLN
INTRAMUSCULAR | Status: DC | PRN
  Administered 2023-01-14: 25 ug via INTRAVENOUS
  Administered 2023-01-14: 50 ug via INTRAVENOUS
  Administered 2023-01-14: 25 ug via INTRAVENOUS

## 2023-01-14 MED ORDER — ONDANSETRON HCL 4 MG PO TABS: 4.0000 mg | ORAL_TABLET | Freq: Four times a day (QID) | ORAL | Status: DC | PRN

## 2023-01-14 MED ORDER — DOCUSATE SODIUM 100 MG PO CAPS
100.0000 mg | ORAL_CAPSULE | Freq: Two times a day (BID) | ORAL | Status: DC
  Administered 2023-01-14 – 2023-01-15 (×2): 100 mg via ORAL
  Filled 2023-01-14 (×2): qty 1

## 2023-01-14 MED ORDER — AMISULPRIDE (ANTIEMETIC) 5 MG/2ML IV SOLN: 10.0000 mg | Freq: Once | INTRAVENOUS | Status: DC | PRN

## 2023-01-14 MED ORDER — SODIUM CHLORIDE 0.9 % IV SOLN: INTRAVENOUS | Status: DC

## 2023-01-14 MED ORDER — BISACODYL 10 MG RE SUPP: 10.0000 mg | Freq: Every day | RECTAL | Status: DC | PRN

## 2023-01-14 MED ORDER — POLYETHYLENE GLYCOL 3350 17 G PO PACK
17.0000 g | PACK | Freq: Two times a day (BID) | ORAL | Status: DC
  Administered 2023-01-14 – 2023-01-15 (×2): 17 g via ORAL
  Filled 2023-01-14 (×2): qty 1

## 2023-01-14 MED ORDER — CHLORHEXIDINE GLUCONATE 0.12 % MT SOLN
15.0000 mL | Freq: Once | OROMUCOSAL | Status: AC
  Administered 2023-01-14: 15 mL via OROMUCOSAL

## 2023-01-14 MED ORDER — PROPOFOL 10 MG/ML IV BOLUS
INTRAVENOUS | Status: DC | PRN
  Administered 2023-01-14 (×2): 10 mg via INTRAVENOUS
  Administered 2023-01-14 (×2): 20 mg via INTRAVENOUS
  Administered 2023-01-14: 30 mg via INTRAVENOUS

## 2023-01-14 MED ORDER — OXYCODONE HCL 5 MG PO TABS: 10.0000 mg | ORAL_TABLET | ORAL | Status: DC | PRN

## 2023-01-14 MED ORDER — BUPIVACAINE-EPINEPHRINE (PF) 0.25% -1:200000 IJ SOLN
INTRAMUSCULAR | Status: DC | PRN
  Administered 2023-01-14: 30 mL

## 2023-01-14 MED ORDER — METHOCARBAMOL 500 MG PO TABS: 500.0000 mg | ORAL_TABLET | Freq: Four times a day (QID) | ORAL | Status: DC | PRN

## 2023-01-14 MED ORDER — OXYCODONE HCL 5 MG PO TABS
ORAL_TABLET | ORAL | Status: AC
  Filled 2023-01-14: qty 1

## 2023-01-14 MED ORDER — CEFAZOLIN SODIUM-DEXTROSE 2-4 GM/100ML-% IV SOLN
2.0000 g | INTRAVENOUS | Status: AC
  Administered 2023-01-14: 2 g via INTRAVENOUS
  Filled 2023-01-14: qty 100

## 2023-01-14 MED ORDER — DEXAMETHASONE SODIUM PHOSPHATE 10 MG/ML IJ SOLN
8.0000 mg | Freq: Once | INTRAMUSCULAR | Status: AC
  Administered 2023-01-14: 8 mg via INTRAVENOUS

## 2023-01-14 MED ORDER — LIDOCAINE HCL (PF) 2 % IJ SOLN
INTRAMUSCULAR | Status: AC
  Filled 2023-01-14: qty 5

## 2023-01-14 MED ORDER — ACETAMINOPHEN 325 MG PO TABS: 325.0000 mg | ORAL_TABLET | Freq: Once | ORAL | Status: DC | PRN

## 2023-01-14 MED ORDER — DEXAMETHASONE SODIUM PHOSPHATE 10 MG/ML IJ SOLN
INTRAMUSCULAR | Status: AC
  Filled 2023-01-14: qty 1

## 2023-01-14 MED ORDER — DIPHENHYDRAMINE HCL 12.5 MG/5ML PO ELIX: 12.5000 mg | ORAL_SOLUTION | ORAL | Status: DC | PRN

## 2023-01-14 MED ORDER — METOCLOPRAMIDE HCL 5 MG PO TABS: 5.0000 mg | ORAL_TABLET | Freq: Three times a day (TID) | ORAL | Status: DC | PRN

## 2023-01-14 MED ORDER — METHOCARBAMOL 500 MG IVPB - SIMPLE MED
INTRAVENOUS | Status: AC
  Filled 2023-01-14: qty 55

## 2023-01-14 MED ORDER — SODIUM CHLORIDE (PF) 0.9 % IJ SOLN
INTRAMUSCULAR | Status: DC | PRN
  Administered 2023-01-14: 30 mL via INTRAVENOUS

## 2023-01-14 MED ORDER — METHOCARBAMOL 500 MG IVPB - SIMPLE MED
500.0000 mg | Freq: Four times a day (QID) | INTRAVENOUS | Status: DC | PRN
  Administered 2023-01-14: 500 mg via INTRAVENOUS

## 2023-01-14 MED ORDER — ONDANSETRON HCL 4 MG/2ML IJ SOLN
INTRAMUSCULAR | Status: AC
  Filled 2023-01-14: qty 2

## 2023-01-14 MED ORDER — ACETAMINOPHEN 500 MG PO TABS
ORAL_TABLET | ORAL | Status: AC
  Filled 2023-01-14: qty 2

## 2023-01-14 MED ORDER — PHENYLEPHRINE HCL-NACL 20-0.9 MG/250ML-% IV SOLN
INTRAVENOUS | Status: DC | PRN
  Administered 2023-01-14 (×2): 80 ug via INTRAVENOUS
  Administered 2023-01-14: 30 ug/min via INTRAVENOUS

## 2023-01-14 MED ORDER — STERILE WATER FOR IRRIGATION IR SOLN
Status: DC | PRN
  Administered 2023-01-14: 2000 mL

## 2023-01-14 MED ORDER — OXYCODONE HCL 5 MG PO TABS
2.5000 mg | ORAL_TABLET | ORAL | Status: DC | PRN
  Administered 2023-01-14 – 2023-01-15 (×2): 5 mg via ORAL
  Filled 2023-01-14: qty 1

## 2023-01-14 MED ORDER — TRANEXAMIC ACID-NACL 1000-0.7 MG/100ML-% IV SOLN
1000.0000 mg | INTRAVENOUS | Status: AC
  Administered 2023-01-14: 1000 mg via INTRAVENOUS
  Filled 2023-01-14: qty 100

## 2023-01-14 MED ORDER — ACETAMINOPHEN 500 MG PO TABS
1000.0000 mg | ORAL_TABLET | Freq: Four times a day (QID) | ORAL | Status: DC
  Administered 2023-01-14 – 2023-01-15 (×3): 1000 mg via ORAL
  Filled 2023-01-14 (×2): qty 2

## 2023-01-14 MED ORDER — KETOROLAC TROMETHAMINE 30 MG/ML IJ SOLN
INTRAMUSCULAR | Status: AC
  Filled 2023-01-14: qty 1

## 2023-01-14 MED ORDER — LACTATED RINGERS IV SOLN: INTRAVENOUS | Status: DC

## 2023-01-14 MED ORDER — ONDANSETRON HCL 4 MG/2ML IJ SOLN
INTRAMUSCULAR | Status: DC | PRN
Start: 2023-01-14 — End: 2023-01-14
  Administered 2023-01-14: 4 mg via INTRAVENOUS

## 2023-01-14 MED ORDER — ACETAMINOPHEN 10 MG/ML IV SOLN: 1000.0000 mg | Freq: Once | INTRAVENOUS | Status: DC | PRN

## 2023-01-14 MED ORDER — MENTHOL 3 MG MT LOZG: 1.0000 | LOZENGE | OROMUCOSAL | Status: DC | PRN

## 2023-01-14 MED ORDER — KETOROLAC TROMETHAMINE 30 MG/ML IJ SOLN
INTRAMUSCULAR | Status: DC | PRN
  Administered 2023-01-14: 30 mg

## 2023-01-14 MED ORDER — LIDOCAINE HCL (PF) 2 % IJ SOLN
INTRAMUSCULAR | Status: DC | PRN
  Administered 2023-01-14: 40 mg via INTRADERMAL

## 2023-01-14 MED ORDER — ORAL CARE MOUTH RINSE: 15.0000 mL | Freq: Once | OROMUCOSAL | Status: AC

## 2023-01-14 MED ORDER — EPHEDRINE SULFATE-NACL 50-0.9 MG/10ML-% IV SOSY
PREFILLED_SYRINGE | INTRAVENOUS | Status: DC | PRN
  Administered 2023-01-14 (×4): 5 mg via INTRAVENOUS

## 2023-01-14 MED ORDER — LISINOPRIL 10 MG PO TABS
10.0000 mg | ORAL_TABLET | Freq: Every day | ORAL | Status: DC
  Administered 2023-01-15: 10 mg via ORAL
  Filled 2023-01-14: qty 1

## 2023-01-14 MED ORDER — PROPOFOL 1000 MG/100ML IV EMUL
INTRAVENOUS | Status: AC
  Filled 2023-01-14: qty 100

## 2023-01-14 MED ORDER — ASPIRIN 81 MG PO CHEW
81.0000 mg | CHEWABLE_TABLET | Freq: Two times a day (BID) | ORAL | Status: DC
  Administered 2023-01-14 – 2023-01-15 (×2): 81 mg via ORAL
  Filled 2023-01-14 (×2): qty 1

## 2023-01-14 MED ORDER — DEXAMETHASONE SODIUM PHOSPHATE 10 MG/ML IJ SOLN
10.0000 mg | Freq: Once | INTRAMUSCULAR | Status: AC
  Administered 2023-01-15: 10 mg via INTRAVENOUS
  Filled 2023-01-14: qty 1

## 2023-01-14 MED ORDER — PHENOL 1.4 % MT LIQD: 1.0000 | OROMUCOSAL | Status: DC | PRN

## 2023-01-14 MED ORDER — SODIUM CHLORIDE (PF) 0.9 % IJ SOLN
INTRAMUSCULAR | Status: AC
  Filled 2023-01-14: qty 50

## 2023-01-14 SURGICAL SUPPLY — 44 items
ADH SKN CLS APL DERMABOND .7 (GAUZE/BANDAGES/DRESSINGS) ×1
BAG COUNTER SPONGE SURGICOUNT (BAG) IMPLANT
BAG SPEC THK2 15X12 ZIP CLS (MISCELLANEOUS)
BAG SPNG CNTER NS LX DISP (BAG)
BAG ZIPLOCK 12X15 (MISCELLANEOUS) IMPLANT
BLADE SAG 18X100X1.27 (BLADE) ×1 IMPLANT
COVER PERINEAL POST (MISCELLANEOUS) ×1 IMPLANT
COVER SURGICAL LIGHT HANDLE (MISCELLANEOUS) ×1 IMPLANT
CUP ACET PINNACLE SECTR 56MM (Hips) IMPLANT
DERMABOND ADVANCED .7 DNX12 (GAUZE/BANDAGES/DRESSINGS) ×1 IMPLANT
DRAPE FOOT SWITCH (DRAPES) ×1 IMPLANT
DRAPE STERI IOBAN 125X83 (DRAPES) ×1 IMPLANT
DRAPE U-SHAPE 47X51 STRL (DRAPES) ×2 IMPLANT
DRESSING AQUACEL AG SP 3.5X10 (GAUZE/BANDAGES/DRESSINGS) ×1 IMPLANT
DRSG AQUACEL AG ADV 3.5X10 (GAUZE/BANDAGES/DRESSINGS) IMPLANT
DRSG AQUACEL AG SP 3.5X10 (GAUZE/BANDAGES/DRESSINGS) ×1
DURAPREP 26ML APPLICATOR (WOUND CARE) ×1 IMPLANT
ELECT REM PT RETURN 15FT ADLT (MISCELLANEOUS) ×1 IMPLANT
GLOVE BIO SURGEON STRL SZ 6 (GLOVE) ×1 IMPLANT
GLOVE BIOGEL PI IND STRL 6.5 (GLOVE) ×1 IMPLANT
GLOVE BIOGEL PI IND STRL 7.5 (GLOVE) ×1 IMPLANT
GLOVE ORTHO TXT STRL SZ7.5 (GLOVE) ×2 IMPLANT
GOWN STRL REUS W/ TWL LRG LVL3 (GOWN DISPOSABLE) ×2 IMPLANT
GOWN STRL REUS W/TWL LRG LVL3 (GOWN DISPOSABLE) ×2
HEAD M SROM 36MM PLUS 1.5 (Hips) IMPLANT
HOLDER FOLEY CATH W/STRAP (MISCELLANEOUS) ×1 IMPLANT
KIT TURNOVER KIT A (KITS) IMPLANT
NDL SAFETY ECLIP 18X1.5 (MISCELLANEOUS) IMPLANT
PACK ANTERIOR HIP CUSTOM (KITS) ×1 IMPLANT
PINNACLE ALTRX PLUS 4 N 36X56 (Hips) IMPLANT
PINNACLE SECTOR CUP 56MM (Hips) ×1 IMPLANT
SCREW 6.5MMX30MM (Screw) IMPLANT
SROM M HEAD 36MM PLUS 1.5 (Hips) ×1 IMPLANT
STEM FEM ACTIS HIGH SZ7 (Stem) IMPLANT
SUT MNCRL AB 4-0 PS2 18 (SUTURE) ×1 IMPLANT
SUT STRATAFIX 0 PDS 27 VIOLET (SUTURE) ×1
SUT VIC AB 1 CT1 36 (SUTURE) ×3 IMPLANT
SUT VIC AB 2-0 CT1 27 (SUTURE) ×2
SUT VIC AB 2-0 CT1 TAPERPNT 27 (SUTURE) ×2 IMPLANT
SUTURE STRATFX 0 PDS 27 VIOLET (SUTURE) ×1 IMPLANT
SYR 3ML LL SCALE MARK (SYRINGE) IMPLANT
TRAY FOLEY MTR SLVR 16FR STAT (SET/KITS/TRAYS/PACK) IMPLANT
TUBE SUCTION HIGH CAP CLEAR NV (SUCTIONS) ×1 IMPLANT
WATER STERILE IRR 1000ML POUR (IV SOLUTION) ×1 IMPLANT

## 2023-01-14 NOTE — Anesthesia Preprocedure Evaluation (Signed)
Anesthesia Evaluation  Patient identified by MRN, date of birth, ID band Patient awake    Reviewed: Allergy & Precautions, NPO status , Patient's Chart, lab work & pertinent test results  Airway Mallampati: I  TM Distance: >3 FB Neck ROM: Full    Dental  (+) Teeth Intact, Dental Advisory Given   Pulmonary neg pulmonary ROS   breath sounds clear to auscultation       Cardiovascular hypertension, Pt. on medications  Rhythm:Regular Rate:Normal     Neuro/Psych negative neurological ROS  negative psych ROS   GI/Hepatic negative GI ROS, Neg liver ROS,,,  Endo/Other  Hypothyroidism    Renal/GU negative Renal ROS     Musculoskeletal  (+) Arthritis ,    Abdominal   Peds  Hematology   Anesthesia Other Findings   Reproductive/Obstetrics                             Anesthesia Physical Anesthesia Plan  ASA: 3  Anesthesia Plan: Spinal   Post-op Pain Management: Tylenol PO (pre-op)*   Induction: Intravenous  PONV Risk Score and Plan: 2 and Ondansetron and Propofol infusion  Airway Management Planned: Natural Airway and Nasal Cannula  Additional Equipment: None  Intra-op Plan:   Post-operative Plan:   Informed Consent: I have reviewed the patients History and Physical, chart, labs and discussed the procedure including the risks, benefits and alternatives for the proposed anesthesia with the patient or authorized representative who has indicated his/her understanding and acceptance.       Plan Discussed with: CRNA  Anesthesia Plan Comments: (Lab Results      Component                Value               Date                      WBC                      6.4                 01/02/2023                HGB                      14.1                01/02/2023                HCT                      43.9                01/02/2023                MCV                      94.6                 01/02/2023                PLT                      175                 01/02/2023           )  Anesthesia Quick Evaluation

## 2023-01-14 NOTE — Anesthesia Postprocedure Evaluation (Signed)
Anesthesia Post Note  Patient: Nathan Reilly  Procedure(s) Performed: TOTAL HIP ARTHROPLASTY ANTERIOR APPROACH (Left: Hip)     Patient location during evaluation: PACU Anesthesia Type: Spinal Level of consciousness: oriented and awake and alert Pain management: pain level controlled Vital Signs Assessment: post-procedure vital signs reviewed and stable Respiratory status: spontaneous breathing, respiratory function stable and patient connected to nasal cannula oxygen Cardiovascular status: blood pressure returned to baseline and stable Postop Assessment: no headache, no backache and no apparent nausea or vomiting Anesthetic complications: no  No notable events documented.  Last Vitals:  Vitals:   01/14/23 1800 01/14/23 1825  BP: (!) 144/80 (!) 149/84  Pulse: (!) 56 (!) 51  Resp: 19 16  Temp:    SpO2: 98% 100%    Last Pain:  Vitals:   01/14/23 1825  TempSrc:   PainSc: 2                  Shelton Silvas

## 2023-01-14 NOTE — Care Plan (Signed)
Ortho Bundle Case Management Note  Patient Details  Name: Nathan Reilly MRN: [id number] Date of Birth: 02-11-1941                  L THA on 01-14-23  DCP: Home with wife  DME: No needs, has a RW  PT: HEP   DME Arranged:  N/A DME Agency:        Additional Comments: Please contact me with any questions of if this plan should need to change.   Ennis Forts, RN,CCM [facility]  [phone] 01/14/2023, 1:55 PM

## 2023-01-14 NOTE — Transfer of Care (Cosign Needed)
Immediate Anesthesia Transfer of Care Note  Patient: Nathan Reilly  Procedure(s) Performed: TOTAL HIP ARTHROPLASTY ANTERIOR APPROACH (Left: Hip)  Patient Location: PACU  Anesthesia Type:Spinal  Level of Consciousness: awake, alert , oriented, and patient cooperative  Airway & Oxygen Therapy: Patient Spontanous Breathing and Patient connected to face mask oxygen  Post-op Assessment: Report given to RN and Post -op Vital signs reviewed and stable  Post vital signs: Reviewed and stable  Last Vitals:  Vitals Value Taken Time  BP 96/75 01/14/23 1603  Temp 36.4 C 01/14/23 1600  Pulse 54 01/14/23 1605  Resp 10 01/14/23 1605  SpO2 99 % 01/14/23 1605  Vitals shown include unfiled device data.  Last Pain:  Vitals:   01/14/23 1214  TempSrc: Oral         Complications: No notable events documented.

## 2023-01-14 NOTE — Plan of Care (Signed)
  Problem: Education: Goal: Knowledge of the prescribed therapeutic regimen will improve Outcome: Progressing   Problem: Pain Management: Goal: Pain level will decrease with appropriate interventions Outcome: Progressing   Problem: Nutrition: Goal: Adequate nutrition will be maintained Outcome: Progressing   

## 2023-01-14 NOTE — Interval H&P Note (Signed)
History and Physical Interval Note:  01/14/2023 1:18 PM  Nathan Reilly  has presented today for surgery, with the diagnosis of Left hip osteoarthritis.  The various methods of treatment have been discussed with the patient and family. After consideration of risks, benefits and other options for treatment, the patient has consented to  Procedure(s) with comments: TOTAL HIP ARTHROPLASTY ANTERIOR APPROACH (Left) - 90 as a surgical intervention.  The patient's history has been reviewed, patient examined, no change in status, stable for surgery.  I have reviewed the patient's chart and labs.  Questions were answered to the patient's satisfaction.     [name]

## 2023-01-14 NOTE — Anesthesia Procedure Notes (Signed)
Procedure Name: MAC Date/Time: 01/14/2023 2:18 PM  Performed by: Maurene Capes, CRNAPre-anesthesia Checklist: Emergency Drugs available, Patient identified, Suction available and Patient being monitored Patient Re-evaluated:Patient Re-evaluated prior to induction Oxygen Delivery Method: Simple face mask Induction Type: IV induction Placement Confirmation: positive ETCO2 Dental Injury: Teeth and Oropharynx as per pre-operative assessment

## 2023-01-14 NOTE — Discharge Instructions (Signed)

## 2023-01-14 NOTE — H&P (Signed)
TOTAL HIP ADMISSION H&P  Patient is admitted for left total hip arthroplasty.  Therapy Plans: HEP Disposition: Home with wife Planned DVT Prophylaxis: aspirin 81mg  BID DME needed: none PCP: Dr. Okey Dupre, clearance received TXA: IV Allergies: NKDA Anesthesia Concerns: none BMI: 26 Last HgbA1c: not diabetic   Other: - No hx of VTE, hx of melanoma - oxycodone, robaxin, tylenol - Works part time at [employer]  Subjective:  Chief Complaint: left hip pain  HPI: ORVELL DERENZO, 82 y.o. male, has a history of pain and functional disability in the left hip(s) due to arthritis and patient has failed non-surgical conservative treatments for greater than 12 weeks to include NSAID's and/or analgesics, corticosteriod injections, and activity modification.  Onset of symptoms was gradual starting 2 years ago with gradually worsening course since that time.The patient noted no past surgery on the left hip(s).  Patient currently rates pain in the left hip at 8 out of 10 with activity. Patient has worsening of pain with activity and weight bearing, pain that interfers with activities of daily living, and pain with passive range of motion. Patient has evidence of joint space narrowing by imaging studies. This condition presents safety issues increasing the risk of falls.  There is no current active infection.  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   Hypothyroidism 05/14/2017   Routine general medical examination at a health care facility 04/12/2011   Essential hypertension 07/17/2007   Hx of colonic polyps 07/17/2007   Past Medical History:  Diagnosis Date   Arthritis    BPH with urinary obstruction    Cancer (HCC)    Foley catheter in place    History of basal cell carcinoma (BCC) of skin    History of Clostridioides difficile colitis 01/2021   per pt resolved   Hypertension     Hypothyroidism    Urinary retention 01/2021   Wears glasses     Past Surgical History:  Procedure Laterality Date   BAND HEMORRHOIDECTOMY  2015   Dr Kinnie Scales   BLEPHAROPLASTY Bilateral 2018   upper eyelids   COLONOSCOPY  2014   Dr Kinnie Scales   TONSILLECTOMY     child   TOTAL KNEE ARTHROPLASTY Left 07/26/2014   Procedure: LEFT TOTAL KNEE ARTHROPLASTY;  Surgeon: [name], MD;  Location: [facility];  Service: Orthopedics;  Laterality: Left;   TRANSURETHRAL RESECTION OF PROSTATE N/A 03/30/2021   Procedure: TRANSURETHRAL RESECTION OF THE PROSTATE (TURP) WITH URETHRAL BIOPSY;  Surgeon: Jannifer Hick, MD;  Location: [facility];  Service: Urology;  Laterality: N/A;    No current facility-administered medications for this encounter.   Current Outpatient Medications  Medication Sig Dispense Refill Last Dose   acidophilus (RISAQUAD) CAPS capsule Take 1 capsule by mouth daily.      diclofenac (VOLTAREN) 75 MG EC tablet Take 75 mg by mouth 2 (two) times daily.      doxazosin (CARDURA) 4 MG tablet TAKE 1 TABLET BY MOUTH EVERY DAY 90 tablet 3    levothyroxine (SYNTHROID) 50 MCG tablet TAKE 1 TABLET BY MOUTH EVERY DAY 90 tablet 2    lisinopril (ZESTRIL) 10 MG tablet TAKE 1 TABLET BY MOUTH EVERY DAY 90 tablet 2    fluticasone (FLONASE) 50 MCG/ACT nasal spray Place 2 sprays into both nostrils daily. Annual appt due in June must see provider for future refills (Patient not taking: Reported on 12/26/2022) 48  g 3 Not Taking   No Known Allergies  Social History   Tobacco Use   Smoking status: Never   Smokeless tobacco: Never  Substance Use Topics   Alcohol use: Not Currently    Family History  Problem Relation Age of Onset   Diabetes Father    Breast cancer Sister    Hypertension Neg Hx    Heart disease Neg Hx    Stroke Neg Hx      Review of Systems  Constitutional:  Negative for chills and fever.  Respiratory:  Negative for cough and shortness of breath.   Cardiovascular:   Negative for chest pain.  Gastrointestinal:  Negative for nausea and vomiting.  Musculoskeletal:  Positive for arthralgias.     Objective:  Physical Exam Well nourished and well developed. General: Alert and oriented x3, cooperative and pleasant, no acute distress. Head: normocephalic, atraumatic, neck supple. Eyes: EOMI.  Musculoskeletal: Left knee exam: His surgical incision is well-healed, no erythema warmth or palpable effusion Full knee extension and flexion of 110 degrees  Upon examination of his left knee I also evaluated his left hip. Upon range of motion of his left hip I recognize a significant loss of motion of the left hip but was associated with pain particular with hip flexion internal rotation just to 5 degrees. In comparison his right hip range of motion is fluid and pain-free with internal rotation over 10 degrees.  Vital signs in last 24 hours:    Labs:   Estimated body mass index is 25.1 kg/m as calculated from the following:   Height as of 01/02/23: 5\' 9"  (1.753 m).   Weight as of 01/02/23: 77.1 kg.   Imaging Review Plain radiographs demonstrate severe degenerative joint disease of the left hip(s). The bone quality appears to be adequate for age and reported activity level.      Assessment/Plan:  End stage arthritis, left hip(s)  The patient history, physical examination, clinical judgement of the provider and imaging studies are consistent with end stage degenerative joint disease of the left hip(s) and total hip arthroplasty is deemed medically necessary. The treatment options including medical management, injection therapy, arthroscopy and arthroplasty were discussed at length. The risks and benefits of total hip arthroplasty were presented and reviewed. The risks due to aseptic loosening, infection, stiffness, dislocation/subluxation,  thromboembolic complications and other imponderables were discussed.  The patient acknowledged the explanation,  agreed to proceed with the plan and consent was signed. Patient is being admitted for inpatient treatment for surgery, pain control, PT, OT, prophylactic antibiotics, VTE prophylaxis, progressive ambulation and ADL's and discharge planning.The patient is planning to be discharged  home.   Rosalene Billings, PA-C Orthopedic Surgery [facility] [place] [phone]

## 2023-01-14 NOTE — Op Note (Signed)
NAME:  Nathan Reilly                ACCOUNT NO.: [id number]      MEDICAL RECORD NO.: [id number]      FACILITY:  [facility]      PHYSICIAN:  [name]  DATE OF BIRTH:  1941-01-09     DATE OF PROCEDURE:  01/14/2023                                 OPERATIVE REPORT         PREOPERATIVE DIAGNOSIS: Left  hip osteoarthritis.      POSTOPERATIVE DIAGNOSIS:  Left hip osteoarthritis.      PROCEDURE:  Left total hip replacement through an anterior approach   utilizing DePuy THR system, component size 56 mm pinnacle cup, a size 36+4 neutral   Altrex liner, a size 7 Hi Actis stem with a 36+1.5 Articuleze metal head ball      SURGEON:  Madlyn Frankel. Charlann Boxer, M.D.      ASSISTANT:  Rosalene Billings, PA-C     ANESTHESIA:  Spinal.      SPECIMENS:  None.      COMPLICATIONS:  None.      BLOOD LOSS:  550 cc     DRAINS:  None.      INDICATION OF THE PROCEDURE:  Nathan Reilly is a 82 y.o. male who had   presented to office for evaluation of left hip pain.  Radiographs revealed   progressive degenerative changes with bone-on-bone   articulation of the  hip joint, including subchondral cystic changes and osteophytes.  The patient had painful limited range of   motion significantly affecting their overall quality of life and function.  The patient was failing to    respond to conservative measures including medications and/or injections and activity modification and at this point was ready   to proceed with more definitive measures.  Consent was obtained for   benefit of pain relief.  Specific risks of infection, DVT, component   failure, dislocation, neurovascular injury, and need for revision surgery were reviewed in the office.     PROCEDURE IN DETAIL:  The patient was brought to operative theater.   Once adequate anesthesia, preoperative antibiotics, 2 gm of Ancef, 1 gm of Tranexamic Acid, and 10 mg of Decadron were administered, the patient was positioned supine on the [employer] table.  Once the patient was safely positioned with adequate padding of boney prominences we predraped out the hip, and used fluoroscopy to confirm orientation of the pelvis.      The left hip was then prepped and draped from proximal iliac crest to   mid thigh with a shower curtain technique.      Time-out was performed identifying the patient, planned procedure, and the appropriate extremity.     An incision was then made 2 cm lateral to the   anterior superior iliac spine extending over the orientation of the   tensor fascia lata muscle and sharp dissection was carried down to the   fascia of the muscle.      The fascia was then incised.  The muscle belly was identified and swept   laterally and retractor placed along the superior neck.  Following   cauterization of the circumflex vessels and removing some pericapsular   fat, a second cobra retractor was placed on the inferior neck.  A T-capsulotomy was made along the line of the   superior neck to the trochanteric fossa, then extended proximally and   distally.  Tag sutures were placed and the retractors were then placed   intracapsular.  We then identified the trochanteric fossa and   orientation of my neck cut and then made a neck osteotomy with the femur on traction.  The femoral   head was removed without difficulty or complication.  Traction was let   off and retractors were placed posterior and anterior around the   acetabulum.      The labrum and foveal tissue were debrided.  I began reaming with a 51 mm   reamer and reamed up to 55 mm reamer with good bony bed preparation and a 56 mm  cup was chosen.  The final 56 mm Pinnacle cup was then impacted under fluoroscopy to confirm the depth of penetration and orientation with respect to   Abduction and forward flexion.  A screw was placed into the ilium followed by the hole eliminator.  The final   36+4 neutral Altrex liner was impacted with good visualized rim fit.  The cup  was positioned anatomically within the acetabular portion of the pelvis.      At this point, the femur was rolled to 100 degrees.  Further capsule was   released off the inferior aspect of the femoral neck.  I then   released the superior capsule proximally.  With the leg in a neutral position the hook was placed laterally   along the femur under the vastus lateralis origin and elevated manually and then held in position using the hook attachment on the bed.  The leg was then extended and adducted with the leg rolled to 100   degrees of external rotation.  Retractors were placed along the medial calcar and posteriorly over the greater trochanter.  Once the proximal femur was fully   exposed, I used a box osteotome to set orientation.  I then began   broaching with the starting chili pepper broach and passed this by hand and then broached up to 7.  With the 7 broach in place I chose a high offset neck and did several trial reductions.  The offset was appropriate, leg lengths   appeared to be equal best matched with the +1.5 head ball trial confirmed radiographically.   Given these findings, I went ahead and dislocated the hip, repositioned all   retractors and positioned the right hip in the extended and abducted position.  The final 7 Hi Actis stem was   chosen and it was impacted down to the level of neck cut.  Based on this   and the trial reductions, a final 36+1.5 Articuleze metal head ball was chosen and   impacted onto a clean and dry trunnion, and the hip was reduced.  The   hip had been irrigated throughout the case again at this point.  I did   reapproximate the superior capsular leaflet to the anterior leaflet   using #1 Vicryl.  The fascia of the   tensor fascia lata muscle was then reapproximated using #1 Vicryl and #0 Stratafix sutures.  The   remaining wound was closed with 2-0 Vicryl and running 4-0 Monocryl.   The hip was cleaned, dried, and dressed sterilely using Dermabond and    Aquacel dressing.  The patient was then brought   to recovery room in stable condition tolerating the procedure well.    Rosalene Billings,  PA-C was present for the entirety of the case involved from   preoperative positioning, perioperative retractor management, general   facilitation of the case, as well as primary wound closure as assistant.            Madlyn Frankel Charlann Boxer, M.D.        01/14/2023 1:18 PM

## 2023-01-15 ENCOUNTER — Encounter (HOSPITAL_COMMUNITY): Payer: Self-pay | Admitting: Orthopedic Surgery

## 2023-01-15 DIAGNOSIS — Z79899 Other long term (current) drug therapy: Secondary | ICD-10-CM | POA: Diagnosis not present

## 2023-01-15 DIAGNOSIS — Z96652 Presence of left artificial knee joint: Secondary | ICD-10-CM | POA: Diagnosis not present

## 2023-01-15 DIAGNOSIS — E039 Hypothyroidism, unspecified: Secondary | ICD-10-CM | POA: Diagnosis not present

## 2023-01-15 DIAGNOSIS — I10 Essential (primary) hypertension: Secondary | ICD-10-CM | POA: Diagnosis not present

## 2023-01-15 DIAGNOSIS — M1612 Unilateral primary osteoarthritis, left hip: Secondary | ICD-10-CM | POA: Diagnosis not present

## 2023-01-15 DIAGNOSIS — Z85828 Personal history of other malignant neoplasm of skin: Secondary | ICD-10-CM | POA: Diagnosis not present

## 2023-01-15 MED ORDER — SENNA 8.6 MG PO TABS: 2.0000 | ORAL_TABLET | Freq: Every day | ORAL | 0 refills | Status: AC

## 2023-01-15 MED ORDER — METHOCARBAMOL 500 MG PO TABS: 500.0000 mg | ORAL_TABLET | Freq: Four times a day (QID) | ORAL | 2 refills | Status: DC | PRN

## 2023-01-15 MED ORDER — OXYCODONE HCL 5 MG PO TABS: 2.5000 mg | ORAL_TABLET | ORAL | 0 refills | Status: DC | PRN

## 2023-01-15 MED ORDER — MELOXICAM 15 MG PO TABS: 15.0000 mg | ORAL_TABLET | Freq: Every day | ORAL | 0 refills | Status: AC

## 2023-01-15 MED ORDER — ASPIRIN 81 MG PO CHEW: 81.0000 mg | CHEWABLE_TABLET | Freq: Two times a day (BID) | ORAL | 0 refills | Status: AC

## 2023-01-15 MED ORDER — POLYETHYLENE GLYCOL 3350 17 G PO PACK: 17.0000 g | PACK | Freq: Two times a day (BID) | ORAL | 0 refills | Status: DC

## 2023-01-15 NOTE — Progress Notes (Signed)
   Subjective: 1 Day Post-Op Procedure(s) (LRB): TOTAL HIP ARTHROPLASTY ANTERIOR APPROACH (Left) Patient reports pain as mild.   Patient seen in rounds for Dr. Charlann Boxer. Patient is resting in bed on exam this morning. No acute events overnight. Foley catheter in place. Patient has not been up with PT yet.  We will start therapy today.   Objective: Vital signs in last 24 hours: Temp:  [97.5 F (36.4 C)-98.7 F (37.1 C)] 98.2 F (36.8 C) (08/07 0549) Pulse Rate:  [46-88] 51 (08/07 0549) Resp:  [11-19] 16 (08/07 0549) BP: (65-160)/(48-84) 118/73 (08/07 0549) SpO2:  [94 %-100 %] 96 % (08/07 0549) Weight:  [77.1 kg] 77.1 kg (08/06 1210)  Intake/Output from previous day:  Intake/Output Summary (Last 24 hours) at 01/15/2023 0719 Last data filed at 01/15/2023 0600 Gross per 24 hour  Intake 2642.49 ml  Output 2050 ml  Net 592.49 ml     Intake/Output this shift: No intake/output data recorded.  Labs: Recent Labs    01/15/23 0324  HGB 12.7*   Recent Labs    01/15/23 0324  WBC 13.9*  RBC 4.00*  HCT 37.8*  PLT 154   Recent Labs    01/15/23 0324  NA 135  K 4.0  CL 104  CO2 22  BUN 23  CREATININE 1.11  GLUCOSE 133*  CALCIUM 8.6*   No results for input(s): "LABPT", "INR" in the last 72 hours.  Exam: General - Patient is Alert and Oriented Extremity - Neurologically intact Sensation intact distally Intact pulses distally Dorsiflexion/Plantar flexion intact Dressing - dressing C/D/I Motor Function - intact, moving foot and toes well on exam.   Past Medical History:  Diagnosis Date   Arthritis    BPH with urinary obstruction    Cancer (HCC)    Foley catheter in place    History of basal cell carcinoma (BCC) of skin    History of Clostridioides difficile colitis 01/2021   per pt resolved   Hypertension    Hypothyroidism    Urinary retention 01/2021   Wears glasses     Assessment/Plan: 1 Day Post-Op Procedure(s) (LRB): TOTAL HIP ARTHROPLASTY ANTERIOR  APPROACH (Left) Principal Problem:   S/P total left hip arthroplasty  Estimated body mass index is 25.1 kg/m as calculated from the following:   Height as of this encounter: 5\' 9"  (1.753 m).   Weight as of this encounter: 77.1 kg. Advance diet Up with therapy D/C IV fluids  DVT Prophylaxis - Aspirin Weight bearing as tolerated.  Hgb stable at 12.7 this AM.  Once foley out, we will want to be sure he is voiding okay as he has had retention issues in the past.  Plan is to go Home after hospital stay. Plan for discharge today after meeting goals with therapy. Follow up in the office in 2 weeks.   Rosalene Billings, PA-C Orthopedic Surgery [phone] 01/15/2023, 7:19 AM

## 2023-01-15 NOTE — Progress Notes (Signed)
Discharge package printed and instructions given to pt and wife. Verbalize understanding. 

## 2023-01-15 NOTE — TOC Transition Note (Signed)
Transition of Care [facility]) - CM/SW Discharge Note   Patient Details  Name: Nathan Reilly MRN: [id number] Date of Birth: 06/10/1941  [facility] CM/SW Contact:  Amada Jupiter, LCSW Phone Number: 01/15/2023, 9:37 AM   Clinical Narrative:     Met briefly with pt who confirms he has needed DME in the home.  Plan for HEP. No TOC needs.  Final next level of care: Home/Self Care Barriers to Discharge: No Barriers Identified   Patient Goals and CMS Choice      Discharge Placement                         Discharge Plan and Services Additional resources added to the After Visit Summary for                  DME Arranged: N/A                    Social Determinants of Health (SDOH) Interventions SDOH Screenings   Food Insecurity: No Food Insecurity (01/14/2023)  Housing: Low Risk  (01/14/2023)  Transportation Needs: No Transportation Needs (01/14/2023)  Utilities: Not At Risk (01/14/2023)  Alcohol Screen: Low Risk  (11/18/2022)  Depression (PHQ2-9): Low Risk  (08/19/2022)  Financial Resource Strain: Low Risk  (11/18/2022)  Physical Activity: Insufficiently Active (11/18/2022)  Social Connections: Socially Integrated (11/18/2022)  Stress: Stress Concern Present (11/18/2022)  Tobacco Use: Low Risk  (01/14/2023)     Readmission Risk Interventions     No data to display

## 2023-01-15 NOTE — Plan of Care (Signed)
  Problem: Activity: Goal: Ability to avoid complications of mobility impairment will improve Outcome: Progressing Goal: Ability to tolerate increased activity will improve Outcome: Progressing   Problem: Pain Management: Goal: Pain level will decrease with appropriate interventions Outcome: Progressing   Problem: Safety: Goal: Ability to remain free from injury will improve Outcome: Progressing

## 2023-01-15 NOTE — Evaluation (Signed)
Physical Therapy Evaluation Patient Details Name: Nathan Reilly MRN: [id number] DOB: [birth year] Today's Date: 01/15/2023  History of Present Illness  Pt is 82 yo male s/p L anterior THA on 01/15/23.  Pt with hx including but not limited to arthritis, HTN, hypothyroidism, and L TKA  Clinical Impression  Pt is s/p THA resulting in the deficits listed below (see PT Problem List). At baseline, pt is active and independent.  Today, pt very motivated and with no pain.  He was able to demonstrate transfers at supervision level and ambulated 300' with RW.  Pt performed stairs similar to home set up.  He has support at home and necessary DME.  Pt demonstrates safe gait & transfers in order to return home from PT perspective once discharged by MD.  While in hospital, will continue to benefit from PT for skilled therapy to advance mobility and exercises.           If plan is discharge home, recommend the following: A little help with walking and/or transfers;A little help with bathing/dressing/bathroom;Assistance with cooking/housework;Help with stairs or ramp for entrance   Can travel by private vehicle        Equipment Recommendations None recommended by PT  Recommendations for Other Services       Functional Status Assessment Patient has had a recent decline in their functional status and demonstrates the ability to make significant improvements in function in a reasonable and predictable amount of time.     Precautions / Restrictions Precautions Precautions: Fall Restrictions Weight Bearing Restrictions: Yes LLE Weight Bearing: Weight bearing as tolerated      Mobility  Bed Mobility Overal bed mobility: Needs Assistance Bed Mobility: Supine to Sit     Supine to sit: Supervision, HOB elevated          Transfers Overall transfer level: Needs assistance Equipment used: Rolling walker (2 wheels) Transfers: Sit to/from Stand Sit to Stand: Contact guard assist, Supervision            General transfer comment: Initial cues for hand placement; Performed x 4 during session progressed from CGA to Supervusuib    Ambulation/Gait Ambulation/Gait assistance: Supervision Gait Distance (Feet): 300 Feet Assistive device: Rolling walker (2 wheels) Gait Pattern/deviations: Step-through pattern Gait velocity: decreased but functional     General Gait Details: Slight decrease in weight shift to L; initial cues for RW proximity with good carry over; steady gait  Stairs Stairs: Yes Stairs assistance: Contact guard assist Stair Management: Two rails, Step to pattern, Forwards Number of Stairs: 6 General stair comments: cued for sequencing  Wheelchair Mobility     Tilt Bed    Modified Rankin (Stroke Patients Only)       Balance Overall balance assessment: Needs assistance Sitting-balance support: No upper extremity supported Sitting balance-Leahy Scale: Normal     Standing balance support: No upper extremity supported, Bilateral upper extremity supported Standing balance-Leahy Scale: Fair Standing balance comment: RW to ambulate but able to do ADLs without UE support                             Pertinent Vitals/Pain Pain Assessment Pain Assessment: No/denies pain    Home Living Family/patient expects to be discharged to:: Private residence Living Arrangements: Spouse/significant other Available Help at Discharge: Family;Available 24 hours/day Type of Home: House Home Access: Level entry     Alternate Level Stairs-Number of Steps: Can stay downstairs but would prefer to go up;  flight of steps Home Layout: Two level;Able to live on main level with bedroom/bathroom Home Equipment: Rolling Walker (2 wheels);Cane - single point;Toilet riser      Prior Function Prior Level of Function : Independent/Modified Independent;Driving                     Extremity/Trunk Assessment   Upper Extremity Assessment Upper Extremity Assessment:  Overall WFL for tasks assessed    Lower Extremity Assessment Lower Extremity Assessment: LLE deficits/detail LLE Deficits / Details: Expected post op changes; ROM WFL; MMT: ankle 5/5, hip and knee 3/5 not further tested    Cervical / Trunk Assessment Cervical / Trunk Assessment: Normal  Communication      Cognition Arousal: Alert Behavior During Therapy: WFL for tasks assessed/performed Overall Cognitive Status: Within Functional Limits for tasks assessed                                          General Comments  Educated on safe ice use, no pivots, car transfers. Also, encouraged walking every 1-2 hours during day. Educated on HEP with focus on mobility the first weeks. Discussed doing exercises within pain control and if pain increasing could decreased ROM, reps, and stop exercises as needed. Encouraged to perform ankle pumps frequently for blood flow     Exercises Total Joint Exercises Ankle Circles/Pumps: AROM, Both, 10 reps, Supine Quad Sets: AROM, Both, 10 reps, Supine Heel Slides: AROM, Left, 5 reps, Supine Long Arc Quad: AROM, Left, 5 reps, Seated Other Exercises Other Exercises: L standing with RW and supervision AROM: hip abd, ext, marching, and hamstring curl x 5 reps   Assessment/Plan    PT Assessment Patient needs continued PT services  PT Problem List Decreased strength;Pain;Decreased balance;Decreased mobility;Decreased knowledge of use of DME;Decreased activity tolerance       PT Treatment Interventions DME instruction;Therapeutic exercise;Gait training;Balance training;Stair training;Functional mobility training;Therapeutic activities;Patient/family education;Modalities    PT Goals (Current goals can be found in the Care Plan section)  Acute Rehab PT Goals Patient Stated Goal: return home PT Goal Formulation: With patient Time For Goal Achievement: 01/29/23 Potential to Achieve Goals: Good    Frequency 7X/week     Co-evaluation                AM-PAC PT "6 Clicks" Mobility  Outcome Measure Help needed turning from your back to your side while in a flat bed without using bedrails?: None Help needed moving from lying on your back to sitting on the side of a flat bed without using bedrails?: A Little Help needed moving to and from a bed to a chair (including a wheelchair)?: A Little Help needed standing up from a chair using your arms (e.g., wheelchair or bedside chair)?: A Little Help needed to walk in hospital room?: A Little Help needed climbing 3-5 steps with a railing? : A Little 6 Click Score: 19    End of Session Equipment Utilized During Treatment: Gait belt Activity Tolerance: Patient tolerated treatment well Patient left: with chair alarm set;in chair;with call bell/phone within reach Nurse Communication: Mobility status PT Visit Diagnosis: Other abnormalities of gait and mobility (R26.89);Muscle weakness (generalized) (M62.81)    Time: 2956-2130 PT Time Calculation (min) (ACUTE ONLY): 30 min   Charges:   PT Evaluation $PT Eval Low Complexity: 1 Low PT Treatments $Gait Training: 8-22 mins PT General Charges $$ ACUTE  PT VISIT: 1 Visit         Anise Salvo, PT Acute Rehab Services [facility] [phone]   Nathan Reilly 01/15/2023, 10:38 AM

## 2023-01-23 ENCOUNTER — Encounter (INDEPENDENT_AMBULATORY_CARE_PROVIDER_SITE_OTHER): Payer: Self-pay

## 2023-01-27 NOTE — Discharge Summary (Signed)
Patient ID: Nathan Reilly MRN: [id number] DOB/AGE: 07/15/1940 82 y.o.  Admit date: 01/14/2023 Discharge date: 01/15/2023  Admission Diagnoses:  Left hip osteoarthritis  Discharge Diagnoses:  Principal Problem:   S/P total left hip arthroplasty   Past Medical History:  Diagnosis Date   Arthritis    BPH with urinary obstruction    Cancer (HCC)    Foley catheter in place    History of basal cell carcinoma (BCC) of skin    History of Clostridioides difficile colitis 01/2021   per pt resolved   Hypertension    Hypothyroidism    Urinary retention 01/2021   Wears glasses     Surgeries: Procedure(s): TOTAL HIP ARTHROPLASTY ANTERIOR APPROACH on 01/14/2023   Consultants:   Discharged Condition: Improved  Hospital Course: Nathan Reilly is an 82 y.o. male who was admitted 01/14/2023 for operative treatment ofS/P total left hip arthroplasty. Patient has severe unremitting pain that affects sleep, daily activities, and work/hobbies. After pre-op clearance the patient was taken to the operating room on 01/14/2023 and underwent  Procedure(s): TOTAL HIP ARTHROPLASTY ANTERIOR APPROACH.    Patient was given perioperative antibiotics:  Anti-infectives (From admission, onward)    Start     Dose/Rate Route Frequency Ordered Stop   01/15/23 0600  ceFAZolin (ANCEF) IVPB 2g/100 mL premix        2 g 200 mL/hr over 30 Minutes Intravenous On call to O.R. 01/14/23 1210 01/14/23 1447   01/14/23 2000  ceFAZolin (ANCEF) IVPB 2g/100 mL premix        2 g 200 mL/hr over 30 Minutes Intravenous Every 6 hours 01/14/23 1828 01/15/23 0248        Patient was given sequential compression devices, early ambulation, and chemoprophylaxis to prevent DVT. Patient worked with PT and was meeting their goals regarding safe ambulation and transfers.  Patient benefited maximally from hospital stay and there were no complications.    Recent vital signs: No data found.   Recent laboratory studies: No results for  input(s): "WBC", "HGB", "HCT", "PLT", "NA", "K", "CL", "CO2", "BUN", "CREATININE", "GLUCOSE", "INR", "CALCIUM" in the last 72 hours.  Invalid input(s): "PT", "2"   Discharge Medications:   Allergies as of 01/15/2023   No Known Allergies      Medication List     STOP taking these medications    diclofenac 75 MG EC tablet Commonly known as: VOLTAREN   fluticasone 50 MCG/ACT nasal spray Commonly known as: FLONASE       TAKE these medications    acidophilus Caps capsule Take 1 capsule by mouth daily.   aspirin 81 MG chewable tablet Chew 1 tablet (81 mg total) by mouth 2 (two) times daily for 28 days.   doxazosin 4 MG tablet Commonly known as: CARDURA TAKE 1 TABLET BY MOUTH EVERY DAY   levothyroxine 50 MCG tablet Commonly known as: SYNTHROID TAKE 1 TABLET BY MOUTH EVERY DAY   lisinopril 10 MG tablet Commonly known as: ZESTRIL TAKE 1 TABLET BY MOUTH EVERY DAY   meloxicam 15 MG tablet Commonly known as: MOBIC Take 1 tablet (15 mg total) by mouth daily.   methocarbamol 500 MG tablet Commonly known as: ROBAXIN Take 1 tablet (500 mg total) by mouth every 6 (six) hours as needed for muscle spasms (muscle pain).   oxyCODONE 5 MG immediate release tablet Commonly known as: Oxy IR/ROXICODONE Take 0.5-1 tablets (2.5-5 mg total) by mouth every 4 (four) hours as needed for moderate pain (pain score 4-6).   polyethylene  glycol 17 g packet Commonly known as: MIRALAX / GLYCOLAX Take 17 g by mouth 2 (two) times daily.   senna 8.6 MG Tabs tablet Commonly known as: SENOKOT Take 2 tablets (17.2 mg total) by mouth at bedtime for 14 days.               Discharge Care Instructions  (From admission, onward)           Start     Ordered   01/15/23 0000  Change dressing       Comments: Maintain surgical dressing until follow up in the clinic. If the edges start to pull up, may reinforce with tape. If the dressing is no longer working, may remove and cover with gauze  and tape, but must keep the area dry and clean.  Call with any questions or concerns.   01/15/23 0722            Diagnostic Studies: DG Pelvis Portable  Result Date: 01/14/2023 CLINICAL DATA:  Status post left hip arthroplasty. EXAM: PORTABLE PELVIS 1-2 VIEWS COMPARISON:  None Available. FINDINGS: Left hip arthroplasty in expected alignment. No periprosthetic lucency or fracture. Lucencies projecting over the greater trochanter are felt to be related to ear the overlying soft tissues. Recent postsurgical change includes air and edema in the soft tissues. IMPRESSION: Left hip arthroplasty without immediate postoperative complication. Electronically Signed   By: Narda Rutherford M.D.   On: 01/14/2023 17:37   DG HIP UNILAT WITH PELVIS 1V LEFT  Result Date: 01/14/2023 CLINICAL DATA:  Status post total left hip arthroplasty. Intraoperative fluoroscopy. EXAM: DG HIP (WITH OR WITHOUT PELVIS) 1V*L* COMPARISON:  KUB 09/07/2015 FINDINGS: Images were performed intraoperatively without the presence of a radiologist. Severe left femoroacetabular joint space narrowing. The patient is undergoing total left hip arthroplasty. No hardware complication is seen. Total fluoroscopy images: 7 Total fluoroscopy time: 6 seconds Total dose: Radiation Exposure Index (as provided by the fluoroscopic device): 1.03 mGy air Kerma Please see intraoperative findings for further detail. IMPRESSION: Intraoperative fluoroscopy for total left hip arthroplasty. Electronically Signed   By: Neita Garnet M.D.   On: 01/14/2023 16:02   DG C-Arm 1-60 Min-No Report  Result Date: 01/14/2023 Fluoroscopy was utilized by the requesting physician.  No radiographic interpretation.    Disposition: Discharge disposition: 01-Home or Self Care       Discharge Instructions     Call MD / Call 911   Complete by: As directed    If you experience chest pain or shortness of breath, CALL 911 and be transported to the hospital emergency room.  If  you develope a fever above 101 F, pus (white drainage) or increased drainage or redness at the wound, or calf pain, call your surgeon's office.   Change dressing   Complete by: As directed    Maintain surgical dressing until follow up in the clinic. If the edges start to pull up, may reinforce with tape. If the dressing is no longer working, may remove and cover with gauze and tape, but must keep the area dry and clean.  Call with any questions or concerns.   Constipation Prevention   Complete by: As directed    Drink plenty of fluids.  Prune juice may be helpful.  You may use a stool softener, such as Colace (over the counter) 100 mg twice a day.  Use MiraLax (over the counter) for constipation as needed.   Diet - low sodium heart healthy   Complete by: As directed  Increase activity slowly as tolerated   Complete by: As directed    Weight bearing as tolerated with assist device (walker, cane, etc) as directed, use it as long as suggested by your surgeon or therapist, typically at least 4-6 weeks.   Post-operative opioid taper instructions:   Complete by: As directed    POST-OPERATIVE OPIOID TAPER INSTRUCTIONS: It is important to wean off of your opioid medication as soon as possible. If you do not need pain medication after your surgery it is ok to stop day one. Opioids include: Codeine, Hydrocodone(Norco, Vicodin), Oxycodone(Percocet, oxycontin) and hydromorphone amongst others.  Long term and even short term use of opiods can cause: Increased pain response Dependence Constipation Depression Respiratory depression And more.  Withdrawal symptoms can include Flu like symptoms Nausea, vomiting And more Techniques to manage these symptoms Hydrate well Eat regular healthy meals Stay active Use relaxation techniques(deep breathing, meditating, yoga) Do Not substitute Alcohol to help with tapering If you have been on opioids for less than two weeks and do not have pain than it is ok  to stop all together.  Plan to wean off of opioids This plan should start within one week post op of your joint replacement. Maintain the same interval or time between taking each dose and first decrease the dose.  Cut the total daily intake of opioids by one tablet each day Next start to increase the time between doses. The last dose that should be eliminated is the evening dose.      TED hose   Complete by: As directed    Use stockings (TED hose) for 2 weeks on both leg(s).  You may remove them at night for sleeping.        Follow-up Information     Durene Romans, MD. Go on 01/29/2023.   Specialty: Orthopedic Surgery Why: You are scheduled for first post op appt on Wednesday August 21 at 3:00pm. Contact information: [address] [place] [address] [phone]                  Signed: Cassandria Anger 01/27/2023, 3:28 PM

## 2023-02-17 ENCOUNTER — Encounter: Payer: Self-pay | Admitting: Internal Medicine

## 2023-02-17 ENCOUNTER — Ambulatory Visit: Payer: Medicare HMO | Admitting: Internal Medicine

## 2023-02-17 VITALS — BP 100/80 | HR 92 | Temp 98.2°F | Ht 69.0 in | Wt 172.0 lb

## 2023-02-17 DIAGNOSIS — N401 Enlarged prostate with lower urinary tract symptoms: Secondary | ICD-10-CM | POA: Diagnosis not present

## 2023-02-17 DIAGNOSIS — I10 Essential (primary) hypertension: Secondary | ICD-10-CM

## 2023-02-17 DIAGNOSIS — Z0000 Encounter for general adult medical examination without abnormal findings: Secondary | ICD-10-CM | POA: Diagnosis not present

## 2023-02-17 DIAGNOSIS — N138 Other obstructive and reflux uropathy: Secondary | ICD-10-CM | POA: Diagnosis not present

## 2023-02-17 DIAGNOSIS — E039 Hypothyroidism, unspecified: Secondary | ICD-10-CM | POA: Diagnosis not present

## 2023-02-17 DIAGNOSIS — R739 Hyperglycemia, unspecified: Secondary | ICD-10-CM

## 2023-02-17 LAB — CBC
HCT: 40.6 % (ref 39.0–52.0)
Hemoglobin: 13.4 g/dL (ref 13.0–17.0)
MCHC: 33 g/dL (ref 30.0–36.0)
MCV: 93.9 fl (ref 78.0–100.0)
Platelets: 188 10*3/uL (ref 150.0–400.0)
RBC: 4.32 Mil/uL (ref 4.22–5.81)
RDW: 13.7 % (ref 11.5–15.5)
WBC: 7.5 10*3/uL (ref 4.0–10.5)

## 2023-02-17 LAB — LIPID PANEL
Cholesterol: 170 mg/dL (ref 0–200)
HDL: 62.4 mg/dL (ref >39.00)
LDL Cholesterol: 93 mg/dL (ref 0–99)
NonHDL: 107.5
Total CHOL/HDL Ratio: 3
Triglycerides: 74 mg/dL (ref 0.0–149.0)
VLDL: 14.8 mg/dL (ref 0.0–40.0)

## 2023-02-17 LAB — PSA: PSA: 0.84 ng/mL (ref 0.10–4.00)

## 2023-02-17 LAB — TSH: TSH: 3.78 u[IU]/mL (ref 0.35–5.50)

## 2023-02-17 LAB — T4, FREE: Free T4: 1.24 ng/dL (ref 0.60–1.60)

## 2023-02-17 LAB — HEMOGLOBIN A1C: Hgb A1c MFr Bld: 5.3 % (ref 4.6–6.5)

## 2023-02-17 NOTE — Assessment & Plan Note (Signed)
Checking HGA1c due to recent elevated sugar on labs.

## 2023-02-17 NOTE — Assessment & Plan Note (Signed)
Checking PSA and taking cardura 4 mg daily and will continue.Symptoms stable.

## 2023-02-17 NOTE — Patient Instructions (Signed)
We will check the labs today and get you with a nutritionist.

## 2023-02-17 NOTE — Assessment & Plan Note (Signed)
Checking TSH and free T4 and adjust synthroid 50 mcg daily as needed.  

## 2023-02-17 NOTE — Assessment & Plan Note (Signed)
BP at goal on lisinopril 10 mg daily. Recent BMP at goal continue.

## 2023-02-17 NOTE — Assessment & Plan Note (Signed)
Flu shot completed for season. Covid-19 up to date. Pneumonia complete. Shingrix complete. Tetanus due 2031. Colonoscopy aged out. Counseled about sun safety and mole surveillance. Counseled about the dangers of distracted driving. Given 10 year screening recommendations.

## 2023-02-17 NOTE — Progress Notes (Signed)
Subjective:   Patient ID: Nathan Reilly, male    DOB: Dec 01, 1940, 82 y.o.   MRN: [id number]  HPI Here for medicare wellness and physical, no new complaints. Please see A/P for status and treatment of chronic medical problems.   Diet: heart healthy Physical activity: active, biking Depression/mood screen: negative Hearing: intact to whispered voice Visual acuity: grossly normal with lens, performs annual eye exam  ADLs: capable Fall risk: none Home safety: good Cognitive evaluation: intact to orientation, naming, recall and repetition EOL planning: adv directives discussed  Flowsheet Row Office Visit from [facility]/19/2022 in [facility]  PHQ-2 Total Score 0           03/30/2021   10:31 AM 02/15/2022    8:27 AM 08/19/2022    8:15 AM 11/20/2022   10:10 AM 02/17/2023    8:21 AM  Fall Risk  Falls in the past year?  0 0 0 0  Was there an injury with Fall?  0 0  0  Fall Risk Category Calculator  0 0  0  Fall Risk Category (Retired)  Low     (RETIRED) Patient Fall Risk Level Moderate fall risk Low fall risk     Patient at Risk for Falls Due to  No Fall Risks No Fall Risks    Fall risk Follow up  Falls evaluation completed Falls evaluation completed  Falls evaluation completed    I have personally reviewed and have noted 1. The patient's medical and social history - reviewed today no changes 2. Their use of alcohol, tobacco or illicit drugs 3. Their current medications and supplements 4. The patient's functional ability including ADL's, fall risks, home safety risks and hearing or visual impairment. 5. Diet and physical activities 6. Evidence for depression or mood disorders 7. Care team reviewed and updated 8.  The patient is not on an opioid pain medication  Patient Care Team: Myrlene Broker, MD as PCP - General (Internal Medicine) Blima Ledger, OD (Optometry) Keddie, Garry Heater, [place] (Inactive) as Pharmacist (Pharmacist) Past Medical  History:  Diagnosis Date   Arthritis    BPH with urinary obstruction    Cancer [facility])    Foley catheter in place    History of basal cell carcinoma (BCC) of skin    History of Clostridioides difficile colitis 01/2021   per pt resolved   Hypertension    Hypothyroidism    Urinary retention 01/2021   Wears glasses    Past Surgical History:  Procedure Laterality Date   BAND HEMORRHOIDECTOMY  2015   Dr Kinnie Scales   BLEPHAROPLASTY Bilateral 2018   upper eyelids   COLONOSCOPY  2014   Dr Kinnie Scales   TONSILLECTOMY     child   TOTAL HIP ARTHROPLASTY Left 01/14/2023   Procedure: TOTAL HIP ARTHROPLASTY ANTERIOR APPROACH;  Surgeon: Durene Romans, MD;  Location: [facility];  Service: Orthopedics;  Laterality: Left;  90   TOTAL KNEE ARTHROPLASTY Left 07/26/2014   Procedure: LEFT TOTAL KNEE ARTHROPLASTY;  Surgeon: [name], MD;  Location: [facility];  Service: Orthopedics;  Laterality: Left;   TRANSURETHRAL RESECTION OF PROSTATE N/A 03/30/2021   Procedure: TRANSURETHRAL RESECTION OF THE PROSTATE (TURP) WITH URETHRAL BIOPSY;  Surgeon: Jannifer Hick, MD;  Location: [facility];  Service: Urology;  Laterality: N/A;   Family History  Problem Relation Age of Onset   Diabetes Father    Breast cancer Sister    Hypertension Neg Hx  Heart disease Neg Hx    Stroke Neg Hx    Review of Systems  Constitutional: Negative.   HENT: Negative.    Eyes: Negative.   Respiratory:  Negative for cough, chest tightness and shortness of breath.   Cardiovascular:  Negative for chest pain, palpitations and leg swelling.  Gastrointestinal:  Negative for abdominal distention, abdominal pain, constipation, diarrhea, nausea and vomiting.  Musculoskeletal: Negative.   Skin: Negative.   Neurological: Negative.   Psychiatric/Behavioral: Negative.      Objective:  Physical Exam Constitutional:      Appearance: He is well-developed.  HENT:     Head: Normocephalic and atraumatic.  Cardiovascular:      Rate and Rhythm: Normal rate and regular rhythm.  Pulmonary:     Effort: Pulmonary effort is normal. No respiratory distress.     Breath sounds: Normal breath sounds. No wheezing or rales.  Abdominal:     General: Bowel sounds are normal. There is no distension.     Palpations: Abdomen is soft.     Tenderness: There is no abdominal tenderness. There is no rebound.  Musculoskeletal:     Cervical back: Normal range of motion.  Skin:    General: Skin is warm and dry.  Neurological:     Mental Status: He is alert and oriented to person, place, and time.     Coordination: Coordination normal.     Vitals:   02/17/23 0818  BP: 100/80  Pulse: 92  Temp: 98.2 F (36.8 C)  TempSrc: Oral  SpO2: 98%  Weight: 172 lb (78 kg)  Height: 5\' 9"  (1.753 m)    Assessment & Plan:

## 2023-03-16 ENCOUNTER — Other Ambulatory Visit: Payer: Self-pay | Admitting: Internal Medicine

## 2023-03-27 DIAGNOSIS — Z85820 Personal history of malignant melanoma of skin: Secondary | ICD-10-CM | POA: Diagnosis not present

## 2023-03-27 DIAGNOSIS — Z86018 Personal history of other benign neoplasm: Secondary | ICD-10-CM | POA: Diagnosis not present

## 2023-03-27 DIAGNOSIS — L821 Other seborrheic keratosis: Secondary | ICD-10-CM | POA: Diagnosis not present

## 2023-03-27 DIAGNOSIS — L989 Disorder of the skin and subcutaneous tissue, unspecified: Secondary | ICD-10-CM | POA: Diagnosis not present

## 2023-03-27 DIAGNOSIS — D225 Melanocytic nevi of trunk: Secondary | ICD-10-CM | POA: Diagnosis not present

## 2023-03-27 DIAGNOSIS — D485 Neoplasm of uncertain behavior of skin: Secondary | ICD-10-CM | POA: Diagnosis not present

## 2023-03-27 DIAGNOSIS — L578 Other skin changes due to chronic exposure to nonionizing radiation: Secondary | ICD-10-CM | POA: Diagnosis not present

## 2023-03-27 DIAGNOSIS — Z85828 Personal history of other malignant neoplasm of skin: Secondary | ICD-10-CM | POA: Diagnosis not present

## 2023-03-27 DIAGNOSIS — D4989 Neoplasm of unspecified behavior of other specified sites: Secondary | ICD-10-CM | POA: Diagnosis not present

## 2023-03-27 DIAGNOSIS — L570 Actinic keratosis: Secondary | ICD-10-CM | POA: Diagnosis not present

## 2023-04-28 DIAGNOSIS — L905 Scar conditions and fibrosis of skin: Secondary | ICD-10-CM | POA: Diagnosis not present

## 2023-04-28 DIAGNOSIS — D0361 Melanoma in situ of right upper limb, including shoulder: Secondary | ICD-10-CM | POA: Diagnosis not present

## 2023-06-06 DIAGNOSIS — M1711 Unilateral primary osteoarthritis, right knee: Secondary | ICD-10-CM | POA: Diagnosis not present

## 2023-06-06 DIAGNOSIS — M25551 Pain in right hip: Secondary | ICD-10-CM | POA: Diagnosis not present

## 2023-06-16 ENCOUNTER — Other Ambulatory Visit: Payer: Self-pay | Admitting: Internal Medicine

## 2023-06-16 ENCOUNTER — Encounter: Payer: Self-pay | Admitting: Internal Medicine

## 2023-06-16 NOTE — Telephone Encounter (Signed)
 Ok to notify pharmacy okay to change maufacturer.

## 2023-06-17 DIAGNOSIS — D487 Neoplasm of uncertain behavior of other specified sites: Secondary | ICD-10-CM | POA: Diagnosis not present

## 2023-06-17 DIAGNOSIS — D485 Neoplasm of uncertain behavior of skin: Secondary | ICD-10-CM | POA: Diagnosis not present

## 2023-06-20 NOTE — Telephone Encounter (Signed)
Called and LVM. 2nd attempt.

## 2023-06-25 MED ORDER — LEVOTHYROXINE SODIUM 50 MCG PO TABS: 50.0000 ug | ORAL_TABLET | Freq: Every day | ORAL | 3 refills | Status: DC

## 2023-06-26 DIAGNOSIS — L578 Other skin changes due to chronic exposure to nonionizing radiation: Secondary | ICD-10-CM | POA: Diagnosis not present

## 2023-06-26 DIAGNOSIS — C44519 Basal cell carcinoma of skin of other part of trunk: Secondary | ICD-10-CM | POA: Diagnosis not present

## 2023-06-26 DIAGNOSIS — D485 Neoplasm of uncertain behavior of skin: Secondary | ICD-10-CM | POA: Diagnosis not present

## 2023-06-26 DIAGNOSIS — L821 Other seborrheic keratosis: Secondary | ICD-10-CM | POA: Diagnosis not present

## 2023-06-26 DIAGNOSIS — L820 Inflamed seborrheic keratosis: Secondary | ICD-10-CM | POA: Diagnosis not present

## 2023-06-26 DIAGNOSIS — Z86018 Personal history of other benign neoplasm: Secondary | ICD-10-CM | POA: Diagnosis not present

## 2023-06-26 DIAGNOSIS — T8131XA Disruption of external operation (surgical) wound, not elsewhere classified, initial encounter: Secondary | ICD-10-CM | POA: Diagnosis not present

## 2023-06-26 DIAGNOSIS — Z85828 Personal history of other malignant neoplasm of skin: Secondary | ICD-10-CM | POA: Diagnosis not present

## 2023-06-26 DIAGNOSIS — D225 Melanocytic nevi of trunk: Secondary | ICD-10-CM | POA: Diagnosis not present

## 2023-06-26 DIAGNOSIS — L814 Other melanin hyperpigmentation: Secondary | ICD-10-CM | POA: Diagnosis not present

## 2023-06-26 DIAGNOSIS — Z85820 Personal history of malignant melanoma of skin: Secondary | ICD-10-CM | POA: Diagnosis not present

## 2023-07-02 DIAGNOSIS — C44519 Basal cell carcinoma of skin of other part of trunk: Secondary | ICD-10-CM | POA: Diagnosis not present

## 2023-07-16 ENCOUNTER — Encounter: Payer: Self-pay | Admitting: Family Medicine

## 2023-07-16 ENCOUNTER — Ambulatory Visit (INDEPENDENT_AMBULATORY_CARE_PROVIDER_SITE_OTHER): Payer: Medicare HMO | Admitting: Family Medicine

## 2023-07-16 VITALS — BP 118/60 | HR 71 | Temp 98.7°F | Ht 69.0 in | Wt 182.2 lb

## 2023-07-16 DIAGNOSIS — J101 Influenza due to other identified influenza virus with other respiratory manifestations: Secondary | ICD-10-CM

## 2023-07-16 DIAGNOSIS — R051 Acute cough: Secondary | ICD-10-CM

## 2023-07-16 DIAGNOSIS — J069 Acute upper respiratory infection, unspecified: Secondary | ICD-10-CM | POA: Diagnosis not present

## 2023-07-16 LAB — POCT INFLUENZA A/B
Influenza A, POC: POSITIVE — AB
Influenza B, POC: NEGATIVE

## 2023-07-16 LAB — POC COVID19 BINAXNOW: SARS Coronavirus 2 Ag: NEGATIVE

## 2023-07-16 MED ORDER — ALBUTEROL SULFATE HFA 108 (90 BASE) MCG/ACT IN AERS: 2.0000 | INHALATION_SPRAY | Freq: Four times a day (QID) | RESPIRATORY_TRACT | 0 refills | Status: DC | PRN

## 2023-07-16 MED ORDER — OSELTAMIVIR PHOSPHATE 75 MG PO CAPS: 75.0000 mg | ORAL_CAPSULE | Freq: Two times a day (BID) | ORAL | 0 refills | Status: AC

## 2023-07-16 MED ORDER — HYDROCODONE BIT-HOMATROP MBR 5-1.5 MG/5ML PO SOLN: 5.0000 mL | Freq: Three times a day (TID) | ORAL | 0 refills | Status: DC | PRN

## 2023-07-16 NOTE — Progress Notes (Signed)
   Acute Office Visit  Subjective:     Patient ID: Nathan Reilly, male    DOB: 03/09/1941, 83 y.o.   MRN: [id number]  Chief Complaint  Patient presents with   Acute Visit    Started yesterday; cough, head pressure, runny nose. Has taken tylenol     HPI Patient is in today for evaluation of cough, headache, sinus pressure, rhinorrhea, for the last 3 days. Has tried tylenol  with little relief. Denies known sick contacts. Has completed flu vaccine this year. Denies abdominal pain, nausea, vomiting, diarrhea, rash, fever, chills, other symptoms.  Medical hx as outlined below.  ROS Per HPI      Objective:    BP 118/60 (BP Location: Left Arm, Patient Position: Sitting, Cuff Size: Normal)   Pulse 71   Temp 98.7 F (37.1 C) (Oral)   Ht 5' 9 (1.753 m)   Wt 182 lb 3.2 oz (82.6 kg)   SpO2 95%   BMI 26.91 kg/m    Physical Exam Vitals and nursing note reviewed.  HENT:     Head: Normocephalic and atraumatic.     Nose: Congestion present.     Mouth/Throat:     Mouth: Mucous membranes are moist.     Pharynx: Oropharynx is clear. No oropharyngeal exudate or posterior oropharyngeal erythema.  Eyes:     Extraocular Movements: Extraocular movements intact.  Cardiovascular:     Rate and Rhythm: Normal rate and regular rhythm.  Pulmonary:     Effort: Pulmonary effort is normal.  Musculoskeletal:     Cervical back: Normal range of motion.  Lymphadenopathy:     Cervical: Cervical adenopathy present.  Neurological:     Mental Status: He is alert.    No results found for any visits on 07/16/23.      Assessment & Plan:  1. Viral URI with cough (Primary)  - POC COVID-19 - POCT Influenza A/B - HYDROcodone  bit-homatropine (HYCODAN) 5-1.5 MG/5ML syrup; Take 5 mLs by mouth every 8 (eight) hours as needed for cough.  Dispense: 120 mL; Refill: 0 - albuterol  (VENTOLIN  HFA) 108 (90 Base) MCG/ACT inhaler; Inhale 2 puffs into the lungs every 6 (six) hours as needed for wheezing or  shortness of breath.  Dispense: 8 g; Refill: 0  2. Influenza A  - POCT Influenza A/B - oseltamivir  (TAMIFLU ) 75 MG capsule; Take 1 capsule (75 mg total) by mouth 2 (two) times daily for 5 days.  Dispense: 10 capsule; Refill: 0  3. Acute cough  - POC COVID-19 - POCT Influenza A/B   Meds ordered this encounter  Medications   oseltamivir  (TAMIFLU ) 75 MG capsule    Sig: Take 1 capsule (75 mg total) by mouth 2 (two) times daily for 5 days.    Dispense:  10 capsule    Refill:  0   HYDROcodone  bit-homatropine (HYCODAN) 5-1.5 MG/5ML syrup    Sig: Take 5 mLs by mouth every 8 (eight) hours as needed for cough.    Dispense:  120 mL    Refill:  0   albuterol  (VENTOLIN  HFA) 108 (90 Base) MCG/ACT inhaler    Sig: Inhale 2 puffs into the lungs every 6 (six) hours as needed for wheezing or shortness of breath.    Dispense:  8 g    Refill:  0    Return if symptoms worsen or fail to improve.  Corean Ku, FNP

## 2023-07-16 NOTE — Patient Instructions (Signed)
 I have sent in tamiflu  for you to take twice a day for 5 days.   I have sent in hydrocodone  cough syrup for you to take 5 mL once daily in the evening as needed for cough.  This medication may make you sleepy.  Do not drive or operate heavy machinery while taking this medication.  I have sent in an albuterol  inhaler for you to use 2 puffs every 4 hours as needed for wheezing.  Continue supportive care at home, make sure you are drinking plenty of fluids and getting some rest.  May take ibuprofen and Tylenol  as needed for fever, aches and pains.  May continue OTC cough and cold medications as needed with benefit.   Follow-up with me if symptoms are persisting over the next week.

## 2023-07-25 DIAGNOSIS — M1711 Unilateral primary osteoarthritis, right knee: Secondary | ICD-10-CM | POA: Diagnosis not present

## 2023-07-25 DIAGNOSIS — M25561 Pain in right knee: Secondary | ICD-10-CM | POA: Diagnosis not present

## 2023-08-01 NOTE — Patient Instructions (Signed)
SURGICAL WAITING ROOM VISITATION  Patients having surgery or a procedure may have no more than 2 support people in the waiting area - these visitors may rotate.    Children under the age of 34 must have an adult with them who is not the patient.  Due to an increase in RSV and influenza rates and associated hospitalizations, children ages 19 and under may not visit patients in [facility] hospitals.  Visitors with respiratory illnesses are discouraged from visiting and should remain at home.  If the patient needs to stay at the hospital during part of their recovery, the visitor guidelines for inpatient rooms apply. Pre-op nurse will coordinate an appropriate time for 1 support person to accompany patient in pre-op.  This support person may not rotate.    Please refer to the [facility] website for the visitor guidelines for Inpatients (after your surgery is over and you are in a regular room).       Your procedure is scheduled on:  08/14/2023    Report to [facility] Main Entrance    Report to admitting at   0515 AM   Call this number if you have problems the morning of surgery [phone]   Do not eat food :After Midnight.   After Midnight you may have the following liquids until ___ 0430___ AM DAY OF SURGERY  Water Non-Citrus Juices (without pulp, NO RED-Apple, White grape, White cranberry) Black Coffee (NO MILK/CREAM OR CREAMERS, sugar ok)  Clear Tea (NO MILK/CREAM OR CREAMERS, sugar ok) regular and decaf                             Plain Jell-O (NO RED)                                           Fruit ices (not with fruit pulp, NO RED)                                     Popsicles (NO RED)                                                               Sports drinks like Gatorade (NO RED)                   The day of surgery:  Drink ONE (1) Pre-Surgery Clear Ensure or G2 at 0430 AM the morning of surgery. Drink in one sitting. Do not sip.  This drink was given to  you during your hospital  pre-op appointment visit. Nothing else to drink after completing the  Pre-Surgery Clear Ensure or G2.          If you have questions, please contact your surgeon's office.       Oral Hygiene is also important to reduce your risk of infection.                                    Remember - BRUSH  YOUR TEETH THE MORNING OF SURGERY WITH YOUR REGULAR TOOTHPASTE  DENTURES WILL BE REMOVED PRIOR TO SURGERY PLEASE DO NOT APPLY "Poly grip" OR ADHESIVES!!!   Do NOT smoke after Midnight   Stop all vitamins and herbal supplements 7 days before surgery.   Take these medicines the morning of surgery with A SIP OF WATER:  Inhalers as usual and bring, cardura, synthroid   DO NOT TAKE ANY ORAL DIABETIC MEDICATIONS DAY OF YOUR SURGERY  Bring CPAP mask and tubing day of surgery.                              You may not have any metal on your body including hair pins, jewelry, and body piercing             Do not wear make-up, lotions, powders, perfumes/cologne, or deodorant  Do not wear nail polish including gel and S&S, artificial/acrylic nails, or any other type of covering on natural nails including finger and toenails. If you have artificial nails, gel coating, etc. that needs to be removed by a nail salon please have this removed prior to surgery or surgery may need to be canceled/ delayed if the surgeon/ anesthesia feels like they are unable to be safely monitored.   Do not shave  48 hours prior to surgery.               Men may shave face and neck.   Do not bring valuables to the hospital. [facility] IS NOT             RESPONSIBLE   FOR VALUABLES.   Contacts, glasses, dentures or bridgework may not be worn into surgery.   Bring small overnight bag day of surgery.   DO NOT BRING YOUR HOME MEDICATIONS TO THE HOSPITAL. PHARMACY WILL DISPENSE MEDICATIONS LISTED ON YOUR MEDICATION LIST TO YOU DURING YOUR ADMISSION IN THE HOSPITAL!    Patients discharged on the day  of surgery will not be allowed to drive home.  Someone NEEDS to stay with you for the first 24 hours after anesthesia.   Special Instructions: Bring a copy of your healthcare power of attorney and living will documents the day of surgery if you haven't scanned them before.              Please read over the following fact sheets you were given: IF YOU HAVE QUESTIONS ABOUT YOUR PRE-OP INSTRUCTIONS PLEASE CALL [phone]   If you received a COVID test during your pre-op visit  it is requested that you wear a mask when out in public, stay away from anyone that may not be feeling well and notify your surgeon if you develop symptoms. If you test positive for Covid or have been in contact with anyone that has tested positive in the last 10 days please notify you surgeon.      Pre-operative 5 CHG Bath Instructions   You can play a key role in reducing the risk of infection after surgery. Your skin needs to be as free of germs as possible. You can reduce the number of germs on your skin by washing with CHG (chlorhexidine gluconate) soap before surgery. CHG is an antiseptic soap that kills germs and continues to kill germs even after washing.   DO NOT use if you have an allergy to chlorhexidine/CHG or antibacterial soaps. If your skin becomes reddened or irritated, stop using the CHG and notify one of  our RNs at [phone].   Please shower with the CHG soap starting 4 days before surgery using the following schedule:     Please keep in mind the following:  DO NOT shave, including legs and underarms, starting the day of your first shower.   You may shave your face at any point before/day of surgery.  Place clean sheets on your bed the day you start using CHG soap. Use a clean washcloth (not used since being washed) for each shower. DO NOT sleep with pets once you start using the CHG.   CHG Shower Instructions:  If you choose to wash your hair and private area, wash first with your normal  shampoo/soap.  After you use shampoo/soap, rinse your hair and body thoroughly to remove shampoo/soap residue.  Turn the water OFF and apply about 3 tablespoons (45 ml) of CHG soap to a CLEAN washcloth.  Apply CHG soap ONLY FROM YOUR NECK DOWN TO YOUR TOES (washing for 3-5 minutes)  DO NOT use CHG soap on face, private areas, open wounds, or sores.  Pay special attention to the area where your surgery is being performed.  If you are having back surgery, having someone wash your back for you may be helpful. Wait 2 minutes after CHG soap is applied, then you may rinse off the CHG soap.  Pat dry with a clean towel  Put on clean clothes/pajamas   If you choose to wear lotion, please use ONLY the CHG-compatible lotions on the back of this paper.     Additional instructions for the day of surgery: DO NOT APPLY any lotions, deodorants, cologne, or perfumes.   Put on clean/comfortable clothes.  Brush your teeth.  Ask your nurse before applying any prescription medications to the skin.      CHG Compatible Lotions   Aveeno Moisturizing lotion  Cetaphil Moisturizing Cream  Cetaphil Moisturizing Lotion  Clairol Herbal Essence Moisturizing Lotion, Dry Skin  Clairol Herbal Essence Moisturizing Lotion, Extra Dry Skin  Clairol Herbal Essence Moisturizing Lotion, Normal Skin  Curel Age Defying Therapeutic Moisturizing Lotion with Alpha Hydroxy  Curel Extreme Care Body Lotion  Curel Soothing Hands Moisturizing Hand Lotion  Curel Therapeutic Moisturizing Cream, Fragrance-Free  Curel Therapeutic Moisturizing Lotion, Fragrance-Free  Curel Therapeutic Moisturizing Lotion, Original Formula  Eucerin Daily Replenishing Lotion  Eucerin Dry Skin Therapy Plus Alpha Hydroxy Crme  Eucerin Dry Skin Therapy Plus Alpha Hydroxy Lotion  Eucerin Original Crme  Eucerin Original Lotion  Eucerin Plus Crme Eucerin Plus Lotion  Eucerin TriLipid Replenishing Lotion  Keri Anti-Bacterial Hand Lotion  Keri Deep  Conditioning Original Lotion Dry Skin Formula Softly Scented  Keri Deep Conditioning Original Lotion, Fragrance Free Sensitive Skin Formula  Keri Lotion Fast Absorbing Fragrance Free Sensitive Skin Formula  Keri Lotion Fast Absorbing Softly Scented Dry Skin Formula  Keri Original Lotion  Keri Skin Renewal Lotion Keri Silky Smooth Lotion  Keri Silky Smooth Sensitive Skin Lotion  Nivea Body Creamy Conditioning Oil  Nivea Body Extra Enriched Teacher, adult education Moisturizing Lotion Nivea Crme  Nivea Skin Firming Lotion  NutraDerm 30 Skin Lotion  NutraDerm Skin Lotion  NutraDerm Therapeutic Skin Cream  NutraDerm Therapeutic Skin Lotion  ProShield Protective Hand Cream  Provon moisturizing lotion

## 2023-08-01 NOTE — Progress Notes (Addendum)
 Anesthesia Review:  PCP: Hillard Danker  Clearance in Media Tab dated 06/25/23.  Clearance is dated 06/19/23.  Cardiologist : none  Chest x-ray : EKG : 11/20/22  Echo : Stress test: Cardiac Cath :  Activity level: can do a flight of stairs without difficutly  Sleep Study/ CPAP : none  Fasting Blood Sugar :      / Checks Blood Sugar -- times a day:   Blood Thinner/ Instructions /Last Dose: ASA / Instructions/ Last Dose :    Positive for Flu  A on 07/16/23 OV on 07/16/23 with Moshe Cipro

## 2023-08-04 ENCOUNTER — Encounter (HOSPITAL_COMMUNITY)
Admission: RE | Admit: 2023-08-04 | Discharge: 2023-08-04 | Disposition: A | Discharge status: Home / Self Care | Payer: Medicare HMO | Source: Ambulatory Visit | Attending: Orthopedic Surgery | Admitting: Orthopedic Surgery

## 2023-08-04 ENCOUNTER — Other Ambulatory Visit: Payer: Self-pay

## 2023-08-04 ENCOUNTER — Encounter (HOSPITAL_COMMUNITY): Payer: Self-pay

## 2023-08-04 VITALS — BP 137/84 | HR 54 | Temp 98.1°F | Resp 16 | Ht 69.0 in | Wt 173.0 lb

## 2023-08-04 DIAGNOSIS — I10 Essential (primary) hypertension: Secondary | ICD-10-CM | POA: Insufficient documentation

## 2023-08-04 DIAGNOSIS — Z01812 Encounter for preprocedural laboratory examination: Secondary | ICD-10-CM | POA: Diagnosis present

## 2023-08-04 DIAGNOSIS — Z01818 Encounter for other preprocedural examination: Secondary | ICD-10-CM

## 2023-08-04 LAB — BASIC METABOLIC PANEL
Anion gap: 8 (ref 5–15)
BUN: 20 mg/dL (ref 8–23)
CO2: 23 mmol/L (ref 22–32)
Calcium: 8.8 mg/dL — ABNORMAL LOW (ref 8.9–10.3)
Chloride: 108 mmol/L (ref 98–111)
Creatinine, Ser: 1.01 mg/dL (ref 0.61–1.24)
GFR, Estimated: >60 mL/min (ref >60)
Glucose, Bld: 105 mg/dL — ABNORMAL HIGH (ref 70–99)
Potassium: 4 mmol/L (ref 3.5–5.1)
Sodium: 139 mmol/L (ref 135–145)

## 2023-08-04 LAB — SURGICAL PCR SCREEN
MRSA, PCR: NEGATIVE
Staphylococcus aureus: NEGATIVE

## 2023-08-04 LAB — CBC
HCT: 42.4 % (ref 39.0–52.0)
Hemoglobin: 13.7 g/dL (ref 13.0–17.0)
MCH: 30 pg (ref 26.0–34.0)
MCHC: 32.3 g/dL (ref 30.0–36.0)
MCV: 92.8 fL (ref 80.0–100.0)
Platelets: 187 10*3/uL (ref 150–400)
RBC: 4.57 MIL/uL (ref 4.22–5.81)
RDW: 14.2 % (ref 11.5–15.5)
WBC: 8.2 10*3/uL (ref 4.0–10.5)
nRBC: 0 % (ref 0.0–0.2)

## 2023-08-05 NOTE — Anesthesia Preprocedure Evaluation (Addendum)
 Anesthesia Evaluation  Patient identified by MRN, date of birth, ID band Patient awake    Reviewed: Allergy & Precautions, NPO status , Patient's Chart, lab work & pertinent test results  History of Anesthesia Complications Negative for: history of anesthetic complications  Airway Mallampati: II  TM Distance: >3 FB Neck ROM: Full    Dental no notable dental hx. (+) Teeth Intact, Dental Advisory Given   Pulmonary neg pulmonary ROS   Pulmonary exam normal breath sounds clear to auscultation       Cardiovascular hypertension, (-) angina (-) CAD and (-) Past MI Normal cardiovascular exam Rhythm:Regular Rate:Normal     Neuro/Psych negative neurological ROS  negative psych ROS   GI/Hepatic negative GI ROS, Neg liver ROS,,,  Endo/Other  Hypothyroidism    Renal/GU      Musculoskeletal  (+) Arthritis , Osteoarthritis,    Abdominal   Peds  Hematology   Anesthesia Other Findings   Reproductive/Obstetrics                             Anesthesia Physical Anesthesia Plan  ASA: 3  Anesthesia Plan: Regional   Post-op Pain Management: Regional block* and Minimal or no pain anticipated   Induction:   PONV Risk Score and Plan: 2 and Treatment may vary due to age or medical condition and Ondansetron  Airway Management Planned: Natural Airway and Nasal Cannula  Additional Equipment: None  Intra-op Plan:   Post-operative Plan:   Informed Consent: I have reviewed the patients History and Physical, chart, labs and discussed the procedure including the risks, benefits and alternatives for the proposed anesthesia with the patient or authorized representative who has indicated his/her understanding and acceptance.     Dental advisory given  Plan Discussed with: CRNA and Surgeon  Anesthesia Plan Comments: (Spinal w R adductor canals)        Anesthesia Quick Evaluation

## 2023-08-12 ENCOUNTER — Other Ambulatory Visit: Payer: Self-pay | Admitting: Family Medicine

## 2023-08-12 DIAGNOSIS — J069 Acute upper respiratory infection, unspecified: Secondary | ICD-10-CM

## 2023-08-12 NOTE — H&P (Signed)
 TOTAL KNEE ADMISSION H&P  Patient is being admitted for right total knee arthroplasty.  Therapy Plans: outpatient therapy at [facility] Disposition: Home with wife Planned DVT Prophylaxis: aspirin 81mg  BID DME needed: ice machine PCP: Dr. Okey Dupre - clearance received TXA: IV Allergies: NKDA Anesthesia Concerns: none BMI: 26 Last HgbA1c: Not diabetic   Other: Very nice patient!!!  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  HPI: Nathan Reilly, 82 y.o. male, has a history of pain and functional disability in the right knee due to arthritis and has failed non-surgical conservative treatments for greater than 12 weeks to includeNSAID's and/or analgesics, corticosteriod injections, and activity modification.  Onset of symptoms was gradual, starting 2 years ago with gradually worsening course since that time. The patient noted no past surgery on the right knee(s).  Patient currently rates pain in the right knee(s) at 7 out of 10 with activity. Patient has worsening of pain with activity and weight bearing, pain that interferes with activities of daily living, and pain with passive range of motion.  Patient has evidence of joint space narrowing by imaging studies.  There is no active infection.  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   Hypothyroidism 05/14/2017   Routine general medical examination at a health care facility 04/12/2011   Essential hypertension 07/17/2007   Hx of colonic polyps 07/17/2007   Past Medical History:  Diagnosis Date   Arthritis     BPH with urinary obstruction    Foley catheter in place    History of basal cell carcinoma (BCC) of skin    History of Clostridioides difficile colitis 01/2021   per pt resolved   Hypertension    Hypothyroidism    Urinary retention 01/2021   Wears glasses     Past Surgical History:  Procedure Laterality Date   BAND HEMORRHOIDECTOMY  2015   Dr Kinnie Scales   BLEPHAROPLASTY Bilateral 2018   upper eyelids   COLONOSCOPY  2014   Dr Kinnie Scales   TONSILLECTOMY     child   TOTAL HIP ARTHROPLASTY Left 01/14/2023   Procedure: TOTAL HIP ARTHROPLASTY ANTERIOR APPROACH;  Surgeon: Durene Romans, MD;  Location: [facility];  Service: Orthopedics;  Laterality: Left;  90   TOTAL KNEE ARTHROPLASTY Left 07/26/2014   Procedure: LEFT TOTAL KNEE ARTHROPLASTY;  Surgeon: [name], MD;  Location: [facility];  Service: Orthopedics;  Laterality: Left;   TRANSURETHRAL RESECTION OF PROSTATE N/A 03/30/2021   Procedure: TRANSURETHRAL RESECTION OF THE PROSTATE (TURP) WITH URETHRAL BIOPSY;  Surgeon: Jannifer Hick, MD;  Location: [facility];  Service: Urology;  Laterality: N/A;    No current facility-administered medications for this encounter.   Current Outpatient Medications  Medication Sig Dispense Refill Last Dose/Taking   doxazosin (CARDURA) 4 MG tablet TAKE 1 TABLET BY MOUTH EVERY DAY 90 tablet 3 Taking   levothyroxine (SYNTHROID) 50 MCG tablet Take 1 tablet (50 mcg total) by mouth daily. 90 tablet 3 Taking   lisinopril (ZESTRIL) 10 MG tablet TAKE 1 TABLET BY MOUTH EVERY DAY 90  tablet 2 Taking   Polyethyl Glycol-Propyl Glycol (LUBRICANT EYE DROPS) 0.4-0.3 % SOLN Place 1-2 drops into both eyes 3 (three) times daily as needed (dry/irritated eyes.).   Taking As Needed   Wheat Dextrin (BENEFIBER DRINK MIX) PACK Take 1 packet by mouth daily.   Taking   albuterol (VENTOLIN HFA) 108 (90 Base) MCG/ACT inhaler TAKE 2 PUFFS BY MOUTH EVERY 6 HOURS AS NEEDED FOR WHEEZE OR SHORTNESS OF BREATH 18 each 1    amoxicillin  (AMOXIL) 500 MG capsule Take 2,000 mg by mouth See admin instructions. Take 4 capsules (2000 mg) by mouth 1 hour prior to dental appointments      No Known Allergies  Social History   Tobacco Use   Smoking status: Never   Smokeless tobacco: Never  Substance Use Topics   Alcohol use: Never    Family History  Problem Relation Age of Onset   Diabetes Father    Breast cancer Sister    Hypertension Neg Hx    Heart disease Neg Hx    Stroke Neg Hx      Review of Systems  Constitutional:  Negative for chills and fever.  Respiratory:  Negative for cough and shortness of breath.   Cardiovascular:  Negative for chest pain.  Gastrointestinal:  Negative for nausea and vomiting.  Musculoskeletal:  Positive for arthralgias.     Objective:  Physical Exam Well nourished and well developed. General: Alert and oriented x3, cooperative and pleasant, no acute distress. Head: normocephalic, atraumatic, neck supple. Eyes: EOMI.  Musculoskeletal: Right knee exam: No palpable effusion, warmth erythema Slight flexion contracture with flexion over 110 degrees Tenderness mainly over the medial and anterior aspect the knee Stable medial and lateral collateral ligaments  Right hip exam: Range of motion assessment of his right hip reveals a relatively fluid range of motion without significant reproducible groin pain and no sharp pains elicited Neurovascular intact in his lower extremities without significant edema, erythema or calf tenderness  Calves soft and nontender. Motor function intact in LE. Strength 5/5 LE bilaterally. Neuro: Distal pulses 2+. Sensation to light touch intact in LE.  Vital signs in last 24 hours:    Labs:   Estimated body mass index is 25.55 kg/m as calculated from the following:   Height as of 08/04/23: 5\' 9"  (1.753 m).   Weight as of 08/04/23: 78.5 kg.   Imaging Review Plain radiographs demonstrate severe degenerative joint disease of the right knee(s). The  overall alignment isneutral. The bone quality appears to be adequate for age and reported activity level.      Assessment/Plan:  End stage arthritis, right knee   The patient history, physical examination, clinical judgment of the provider and imaging studies are consistent with end stage degenerative joint disease of the right knee(s) and total knee arthroplasty is deemed medically necessary. The treatment options including medical management, injection therapy arthroscopy and arthroplasty were discussed at length. The risks and benefits of total knee arthroplasty were presented and reviewed. The risks due to aseptic loosening, infection, stiffness, patella tracking problems, thromboembolic complications and other imponderables were discussed. The patient acknowledged the explanation, agreed to proceed with the plan and consent was signed. Patient is being admitted for inpatient treatment for surgery, pain control, PT, OT, prophylactic antibiotics, VTE prophylaxis, progressive ambulation and ADL's and discharge planning. The patient is planning to be discharged  home.     Patient's anticipated LOS is less than 2 midnights, meeting these requirements: - Younger than 65 - Lives  within 1 hour of care - Has a competent adult at home to recover with post-op recover - NO history of  - Chronic pain requiring opiods  - Diabetes  - Coronary Artery Disease  - Heart failure  - Heart attack  - Stroke  - DVT/VTE  - Cardiac arrhythmia  - Respiratory Failure/COPD  - Renal failure  - Anemia  - Advanced Liver disease  Rosalene Billings, PA-C Orthopedic Surgery [facility] [place] [phone]

## 2023-08-14 ENCOUNTER — Encounter (HOSPITAL_COMMUNITY): Payer: Self-pay | Admitting: Orthopedic Surgery

## 2023-08-14 ENCOUNTER — Ambulatory Visit (HOSPITAL_COMMUNITY): Payer: Self-pay | Admitting: Anesthesiology

## 2023-08-14 ENCOUNTER — Other Ambulatory Visit: Payer: Self-pay

## 2023-08-14 ENCOUNTER — Ambulatory Visit (HOSPITAL_COMMUNITY): Payer: Self-pay | Admitting: Medical

## 2023-08-14 ENCOUNTER — Encounter (HOSPITAL_COMMUNITY)
Admission: RE | Disposition: A | Discharge status: Home / Self Care | Payer: Self-pay | Source: Ambulatory Visit | Attending: Orthopedic Surgery

## 2023-08-14 ENCOUNTER — Ambulatory Visit (HOSPITAL_COMMUNITY)
Admission: RE | Admit: 2023-08-14 | Discharge: 2023-08-15 | Disposition: A | Discharge status: Home / Self Care | Payer: Medicare HMO | Source: Ambulatory Visit | Attending: Orthopedic Surgery | Admitting: Orthopedic Surgery

## 2023-08-14 DIAGNOSIS — Z96651 Presence of right artificial knee joint: Secondary | ICD-10-CM

## 2023-08-14 DIAGNOSIS — Z96642 Presence of left artificial hip joint: Secondary | ICD-10-CM | POA: Diagnosis not present

## 2023-08-14 DIAGNOSIS — I10 Essential (primary) hypertension: Secondary | ICD-10-CM | POA: Diagnosis not present

## 2023-08-14 DIAGNOSIS — Z96652 Presence of left artificial knee joint: Secondary | ICD-10-CM | POA: Insufficient documentation

## 2023-08-14 DIAGNOSIS — Z01818 Encounter for other preprocedural examination: Secondary | ICD-10-CM

## 2023-08-14 DIAGNOSIS — G8918 Other acute postprocedural pain: Secondary | ICD-10-CM | POA: Diagnosis not present

## 2023-08-14 DIAGNOSIS — E039 Hypothyroidism, unspecified: Secondary | ICD-10-CM

## 2023-08-14 DIAGNOSIS — M1711 Unilateral primary osteoarthritis, right knee: Secondary | ICD-10-CM

## 2023-08-14 HISTORY — PX: TOTAL KNEE ARTHROPLASTY: SHX125

## 2023-08-14 SURGERY — ARTHROPLASTY, KNEE, TOTAL
Anesthesia: Regional | Site: Knee | Laterality: Right

## 2023-08-14 MED ORDER — CHLORHEXIDINE GLUCONATE 0.12 % MT SOLN
15.0000 mL | Freq: Once | OROMUCOSAL | Status: AC
  Administered 2023-08-14: 15 mL via OROMUCOSAL

## 2023-08-14 MED ORDER — ONDANSETRON HCL 4 MG PO TABS: 4.0000 mg | ORAL_TABLET | Freq: Four times a day (QID) | ORAL | Status: DC | PRN

## 2023-08-14 MED ORDER — 0.9 % SODIUM CHLORIDE (POUR BTL) OPTIME
TOPICAL | Status: DC | PRN
  Administered 2023-08-14: 1000 mL

## 2023-08-14 MED ORDER — METOCLOPRAMIDE HCL 5 MG PO TABS: 5.0000 mg | ORAL_TABLET | Freq: Three times a day (TID) | ORAL | Status: DC | PRN

## 2023-08-14 MED ORDER — SODIUM CHLORIDE 0.9 % IR SOLN
Status: DC | PRN
  Administered 2023-08-14: 1000 mL

## 2023-08-14 MED ORDER — DEXAMETHASONE SODIUM PHOSPHATE 10 MG/ML IJ SOLN
INTRAMUSCULAR | Status: AC
  Filled 2023-08-14: qty 1

## 2023-08-14 MED ORDER — GLYCOPYRROLATE 0.2 MG/ML IJ SOLN
INTRAMUSCULAR | Status: AC
  Filled 2023-08-14: qty 1

## 2023-08-14 MED ORDER — GLYCOPYRROLATE 0.2 MG/ML IJ SOLN
INTRAMUSCULAR | Status: DC | PRN
  Administered 2023-08-14: .2 mg via INTRAVENOUS

## 2023-08-14 MED ORDER — CEFAZOLIN SODIUM-DEXTROSE 2-4 GM/100ML-% IV SOLN
2.0000 g | INTRAVENOUS | Status: AC
  Administered 2023-08-14: 2 g via INTRAVENOUS
  Filled 2023-08-14: qty 100

## 2023-08-14 MED ORDER — PHENYLEPHRINE HCL-NACL 20-0.9 MG/250ML-% IV SOLN
INTRAVENOUS | Status: AC
  Filled 2023-08-14: qty 500

## 2023-08-14 MED ORDER — POLYETHYLENE GLYCOL 3350 17 G PO PACK
17.0000 g | PACK | Freq: Two times a day (BID) | ORAL | Status: DC
  Administered 2023-08-15: 17 g via ORAL
  Filled 2023-08-14: qty 1

## 2023-08-14 MED ORDER — PROPOFOL 500 MG/50ML IV EMUL
INTRAVENOUS | Status: DC | PRN
  Administered 2023-08-14: 80 ug/kg/min via INTRAVENOUS

## 2023-08-14 MED ORDER — ONDANSETRON HCL 4 MG/2ML IJ SOLN: 4.0000 mg | Freq: Four times a day (QID) | INTRAMUSCULAR | Status: DC | PRN

## 2023-08-14 MED ORDER — TRANEXAMIC ACID-NACL 1000-0.7 MG/100ML-% IV SOLN
INTRAVENOUS | Status: AC
  Filled 2023-08-14: qty 100

## 2023-08-14 MED ORDER — PHENOL 1.4 % MT LIQD: 1.0000 | OROMUCOSAL | Status: DC | PRN

## 2023-08-14 MED ORDER — HYDROMORPHONE HCL 1 MG/ML IJ SOLN: 0.5000 mg | INTRAMUSCULAR | Status: DC | PRN

## 2023-08-14 MED ORDER — TRANEXAMIC ACID-NACL 1000-0.7 MG/100ML-% IV SOLN
1000.0000 mg | Freq: Once | INTRAVENOUS | Status: AC
  Administered 2023-08-14: 1000 mg via INTRAVENOUS

## 2023-08-14 MED ORDER — ASPIRIN 81 MG PO CHEW
81.0000 mg | CHEWABLE_TABLET | Freq: Two times a day (BID) | ORAL | Status: DC
  Administered 2023-08-14 – 2023-08-15 (×2): 81 mg via ORAL
  Filled 2023-08-14 (×2): qty 1

## 2023-08-14 MED ORDER — METOCLOPRAMIDE HCL 5 MG/ML IJ SOLN: 5.0000 mg | Freq: Three times a day (TID) | INTRAMUSCULAR | Status: DC | PRN

## 2023-08-14 MED ORDER — OXYCODONE HCL 5 MG/5ML PO SOLN
ORAL | Status: AC
  Filled 2023-08-14: qty 5

## 2023-08-14 MED ORDER — ALUM & MAG HYDROXIDE-SIMETH 200-200-20 MG/5ML PO SUSP: 30.0000 mL | ORAL | Status: DC | PRN

## 2023-08-14 MED ORDER — SODIUM CHLORIDE (PF) 0.9 % IJ SOLN
INTRAMUSCULAR | Status: AC
  Filled 2023-08-14: qty 30

## 2023-08-14 MED ORDER — ROPIVACAINE HCL 5 MG/ML IJ SOLN
INTRAMUSCULAR | Status: DC | PRN
  Administered 2023-08-14: 30 mL via PERINEURAL

## 2023-08-14 MED ORDER — PHENYLEPHRINE HCL-NACL 20-0.9 MG/250ML-% IV SOLN
INTRAVENOUS | Status: DC | PRN
Start: 2023-08-14 — End: 2023-08-14
  Administered 2023-08-14: 30 ug/min via INTRAVENOUS

## 2023-08-14 MED ORDER — ACETAMINOPHEN 500 MG PO TABS
ORAL_TABLET | ORAL | Status: AC
  Filled 2023-08-14: qty 2

## 2023-08-14 MED ORDER — ACETAMINOPHEN 10 MG/ML IV SOLN: 1000.0000 mg | Freq: Once | INTRAVENOUS | Status: DC | PRN

## 2023-08-14 MED ORDER — DEXAMETHASONE SODIUM PHOSPHATE 10 MG/ML IJ SOLN
10.0000 mg | Freq: Once | INTRAMUSCULAR | Status: AC
  Administered 2023-08-15: 10 mg via INTRAVENOUS
  Filled 2023-08-14: qty 1

## 2023-08-14 MED ORDER — SODIUM CHLORIDE 0.9% FLUSH: 3.0000 mL | INTRAVENOUS | Status: DC | PRN

## 2023-08-14 MED ORDER — CEFAZOLIN SODIUM-DEXTROSE 2-4 GM/100ML-% IV SOLN
2.0000 g | Freq: Four times a day (QID) | INTRAVENOUS | Status: AC
  Administered 2023-08-14 (×2): 2 g via INTRAVENOUS
  Filled 2023-08-14 (×2): qty 100

## 2023-08-14 MED ORDER — ORAL CARE MOUTH RINSE: 15.0000 mL | Freq: Once | OROMUCOSAL | Status: AC

## 2023-08-14 MED ORDER — KETOROLAC TROMETHAMINE 30 MG/ML IJ SOLN
INTRAMUSCULAR | Status: AC
  Filled 2023-08-14: qty 1

## 2023-08-14 MED ORDER — OXYCODONE HCL 5 MG PO TABS
ORAL_TABLET | ORAL | Status: AC
  Filled 2023-08-14: qty 1

## 2023-08-14 MED ORDER — PROPOFOL 1000 MG/100ML IV EMUL
INTRAVENOUS | Status: AC
  Filled 2023-08-14: qty 100

## 2023-08-14 MED ORDER — BUPIVACAINE IN DEXTROSE 0.75-8.25 % IT SOLN
INTRATHECAL | Status: DC | PRN
  Administered 2023-08-14: 12 mg via INTRATHECAL

## 2023-08-14 MED ORDER — ACETAMINOPHEN 325 MG PO TABS: 325.0000 mg | ORAL_TABLET | Freq: Four times a day (QID) | ORAL | Status: DC | PRN

## 2023-08-14 MED ORDER — EPHEDRINE 5 MG/ML INJ
INTRAVENOUS | Status: AC
  Filled 2023-08-14: qty 5

## 2023-08-14 MED ORDER — FENTANYL CITRATE (PF) 100 MCG/2ML IJ SOLN
INTRAMUSCULAR | Status: DC | PRN
Start: 2023-08-14 — End: 2023-08-14
  Administered 2023-08-14 (×4): 25 ug via INTRAVENOUS

## 2023-08-14 MED ORDER — TRANEXAMIC ACID-NACL 1000-0.7 MG/100ML-% IV SOLN
1000.0000 mg | INTRAVENOUS | Status: AC
  Administered 2023-08-14: 1000 mg via INTRAVENOUS
  Filled 2023-08-14: qty 100

## 2023-08-14 MED ORDER — FENTANYL CITRATE PF 50 MCG/ML IJ SOSY: 25.0000 ug | PREFILLED_SYRINGE | INTRAMUSCULAR | Status: DC | PRN

## 2023-08-14 MED ORDER — MENTHOL 3 MG MT LOZG: 1.0000 | LOZENGE | OROMUCOSAL | Status: DC | PRN

## 2023-08-14 MED ORDER — EPHEDRINE SULFATE (PRESSORS) 50 MG/ML IJ SOLN
INTRAMUSCULAR | Status: DC | PRN
  Administered 2023-08-14: 10 mg via INTRAVENOUS

## 2023-08-14 MED ORDER — BISACODYL 10 MG RE SUPP: 10.0000 mg | Freq: Every day | RECTAL | Status: DC | PRN

## 2023-08-14 MED ORDER — DIPHENHYDRAMINE HCL 12.5 MG/5ML PO ELIX: 12.5000 mg | ORAL_SOLUTION | ORAL | Status: DC | PRN

## 2023-08-14 MED ORDER — OXYCODONE HCL 5 MG PO TABS
5.0000 mg | ORAL_TABLET | ORAL | Status: DC | PRN
  Administered 2023-08-14 – 2023-08-15 (×3): 5 mg via ORAL
  Filled 2023-08-14 (×2): qty 1

## 2023-08-14 MED ORDER — DEXAMETHASONE SODIUM PHOSPHATE 10 MG/ML IJ SOLN
8.0000 mg | Freq: Once | INTRAMUSCULAR | Status: AC
  Administered 2023-08-14: 8 mg via INTRAVENOUS
  Administered 2023-08-14: 10 mg via INTRAVENOUS

## 2023-08-14 MED ORDER — LACTATED RINGERS IV SOLN: INTRAVENOUS | Status: DC

## 2023-08-14 MED ORDER — METHOCARBAMOL 500 MG PO TABS
500.0000 mg | ORAL_TABLET | Freq: Four times a day (QID) | ORAL | Status: DC | PRN
  Administered 2023-08-14 – 2023-08-15 (×5): 500 mg via ORAL
  Filled 2023-08-14 (×4): qty 1

## 2023-08-14 MED ORDER — SODIUM CHLORIDE 0.9% FLUSH
3.0000 mL | Freq: Two times a day (BID) | INTRAVENOUS | Status: DC
  Administered 2023-08-14 (×2): 3 mL via INTRAVENOUS

## 2023-08-14 MED ORDER — POVIDONE-IODINE 10 % EX SWAB: 2.0000 | Freq: Once | CUTANEOUS | Status: DC

## 2023-08-14 MED ORDER — BUPIVACAINE-EPINEPHRINE (PF) 0.25% -1:200000 IJ SOLN
INTRAMUSCULAR | Status: AC
  Filled 2023-08-14: qty 30

## 2023-08-14 MED ORDER — SODIUM CHLORIDE (PF) 0.9 % IJ SOLN
INTRAMUSCULAR | Status: DC | PRN
  Administered 2023-08-14: 61 mL

## 2023-08-14 MED ORDER — OXYCODONE HCL 5 MG PO TABS: 10.0000 mg | ORAL_TABLET | ORAL | Status: DC | PRN

## 2023-08-14 MED ORDER — METHOCARBAMOL 1000 MG/10ML IJ SOLN: 500.0000 mg | Freq: Four times a day (QID) | INTRAMUSCULAR | Status: DC | PRN

## 2023-08-14 MED ORDER — SENNA 8.6 MG PO TABS: 2.0000 | ORAL_TABLET | Freq: Every day | ORAL | Status: DC

## 2023-08-14 MED ORDER — ACETAMINOPHEN 500 MG PO TABS
1000.0000 mg | ORAL_TABLET | Freq: Four times a day (QID) | ORAL | Status: AC
  Administered 2023-08-14 – 2023-08-15 (×4): 1000 mg via ORAL
  Filled 2023-08-14 (×4): qty 2

## 2023-08-14 MED ORDER — FENTANYL CITRATE (PF) 100 MCG/2ML IJ SOLN
INTRAMUSCULAR | Status: AC
  Filled 2023-08-14: qty 2

## 2023-08-14 MED ORDER — ONDANSETRON HCL 4 MG/2ML IJ SOLN: 4.0000 mg | Freq: Once | INTRAMUSCULAR | Status: DC | PRN

## 2023-08-14 MED ORDER — SODIUM CHLORIDE 0.9% FLUSH: 3.0000 mL | Freq: Two times a day (BID) | INTRAVENOUS | Status: DC

## 2023-08-14 MED ORDER — CLONIDINE HCL (ANALGESIA) 100 MCG/ML EP SOLN
EPIDURAL | Status: DC | PRN
  Administered 2023-08-14: 100 ug

## 2023-08-14 MED ORDER — METHOCARBAMOL 500 MG PO TABS
ORAL_TABLET | ORAL | Status: AC
  Filled 2023-08-14: qty 1

## 2023-08-14 SURGICAL SUPPLY — 48 items
ATTUNE MED ANAT PAT 38 KNEE (Knees) IMPLANT
BAG COUNTER SPONGE SURGICOUNT (BAG) IMPLANT
BAG ZIPLOCK 12X15 (MISCELLANEOUS) IMPLANT
BASE TIBIAL CEM ATTUNE SZ 7 (Knees) ×1 IMPLANT
BASEPLATE TIB CEM ATTUNE SZ7 (Knees) IMPLANT
BLADE SAW SGTL 13.0X1.19X90.0M (BLADE) ×1 IMPLANT
BNDG ELASTIC 6INX 5YD STR LF (GAUZE/BANDAGES/DRESSINGS) ×1 IMPLANT
BOWL SMART MIX CTS (DISPOSABLE) ×1 IMPLANT
CEMENT HV SMART SET (Cement) ×2 IMPLANT
COMP FEM CMT ATTUNE KNEE 6N RT (Joint) ×1 IMPLANT
COMPONENT FEM CMT ATTN KN 6NRT (Joint) IMPLANT
COOLER ICEMAN CLASSIC (MISCELLANEOUS) IMPLANT
COVER SURGICAL LIGHT HANDLE (MISCELLANEOUS) ×1 IMPLANT
CUFF TRNQT CYL 34X4.125X (TOURNIQUET CUFF) ×1 IMPLANT
DERMABOND ADVANCED .7 DNX12 (GAUZE/BANDAGES/DRESSINGS) ×1 IMPLANT
DRAPE U-SHAPE 47X51 STRL (DRAPES) ×1 IMPLANT
DRESSING AQUACEL AG SP 3.5X10 (GAUZE/BANDAGES/DRESSINGS) ×1 IMPLANT
DRSG AQUACEL AG SP 3.5X10 (GAUZE/BANDAGES/DRESSINGS) ×1 IMPLANT
DURAPREP 26ML APPLICATOR (WOUND CARE) ×2 IMPLANT
ELECT REM PT RETURN 15FT ADLT (MISCELLANEOUS) ×1 IMPLANT
GLOVE BIO SURGEON STRL SZ 6 (GLOVE) ×1 IMPLANT
GLOVE BIOGEL PI IND STRL 6.5 (GLOVE) ×1 IMPLANT
GLOVE BIOGEL PI IND STRL 7.5 (GLOVE) ×1 IMPLANT
GLOVE ORTHO TXT STRL SZ7.5 (GLOVE) ×2 IMPLANT
GOWN STRL REUS W/ TWL LRG LVL3 (GOWN DISPOSABLE) ×2 IMPLANT
HOLDER FOLEY CATH W/STRAP (MISCELLANEOUS) IMPLANT
INSERT MED ATTUNE KNEE 6 6 RT (Insert) IMPLANT
KIT TURNOVER KIT A (KITS) IMPLANT
MANIFOLD NEPTUNE II (INSTRUMENTS) ×1 IMPLANT
NDL SAFETY ECLIPSE 18X1.5 (NEEDLE) IMPLANT
NS IRRIG 1000ML POUR BTL (IV SOLUTION) ×1 IMPLANT
PACK TOTAL KNEE CUSTOM (KITS) ×1 IMPLANT
PAD COLD SHLDR WRAP-ON (PAD) IMPLANT
PIN FIX SIGMA LCS THRD HI (PIN) IMPLANT
PROTECTOR NERVE ULNAR (MISCELLANEOUS) ×1 IMPLANT
SET HNDPC FAN SPRY TIP SCT (DISPOSABLE) ×1 IMPLANT
SET PAD KNEE POSITIONER (MISCELLANEOUS) ×1 IMPLANT
SPIKE FLUID TRANSFER (MISCELLANEOUS) ×2 IMPLANT
SUT MNCRL AB 4-0 PS2 18 (SUTURE) ×1 IMPLANT
SUT STRATAFIX PDS+ 0 24IN (SUTURE) ×1 IMPLANT
SUT VIC AB 1 CT1 36 (SUTURE) ×1 IMPLANT
SUT VIC AB 2-0 CT1 TAPERPNT 27 (SUTURE) ×2 IMPLANT
SYR 3ML LL SCALE MARK (SYRINGE) ×1 IMPLANT
TOWEL GREEN STERILE FF (TOWEL DISPOSABLE) ×1 IMPLANT
TRAY FOLEY MTR SLVR 16FR STAT (SET/KITS/TRAYS/PACK) ×1 IMPLANT
TUBE SUCTION HIGH CAP CLEAR NV (SUCTIONS) ×1 IMPLANT
WATER STERILE IRR 1000ML POUR (IV SOLUTION) ×2 IMPLANT
WRAP KNEE MAXI GEL POST OP (GAUZE/BANDAGES/DRESSINGS) ×1 IMPLANT

## 2023-08-14 NOTE — Care Plan (Signed)
 Ortho Bundle Case Management Note  Patient Details  Name: Nathan Reilly MRN: [id number] Date of Birth: 10/03/1940                  RT TKA 08/14/23  DCP: Home with wife  DME: No Needs/has RW  PT: [facility] 3/10   DME Arranged:  N/A DME Agency:     HH Arranged:    HH Agency:     Additional Comments: Please contact me with any questions of if this plan should need to change.    Aida Raider, Case Manager [facility]   [phone] 08/14/2023, 9:26 AM

## 2023-08-14 NOTE — Anesthesia Procedure Notes (Signed)
 Procedure Name: MAC Date/Time: 08/14/2023 7:32 AM  Performed by: Floydene Flock, CRNAPre-anesthesia Checklist: Patient identified, Emergency Drugs available, Suction available and Patient being monitored Patient Re-evaluated:Patient Re-evaluated prior to induction Oxygen Delivery Method: Simple face mask Placement Confirmation: positive ETCO2

## 2023-08-14 NOTE — Anesthesia Procedure Notes (Addendum)
 Spinal  Patient location during procedure: OR Start time: 08/14/2023 7:30 AM End time: 08/14/2023 7:37 AM Reason for block: surgical anesthesia Staffing Performed: anesthesiologist  Anesthesiologist: Trevor Iha, MD Performed by: Trevor Iha, MD Authorized by: Trevor Iha, MD   Preanesthetic Checklist Completed: patient identified, IV checked, risks and benefits discussed, surgical consent, monitors and equipment checked, pre-op evaluation and timeout performed Spinal Block Patient position: sitting Prep: DuraPrep and site prepped and draped Patient monitoring: heart rate, cardiac monitor, continuous pulse ox and blood pressure Approach: midline Location: L3-4 Injection technique: single-shot Needle Needle type: Pencan  Needle gauge: 24 G Needle length: 10 cm Needle insertion depth: 5 cm Assessment Sensory level: T4 Events: CSF return Additional Notes  1 Attempt (s). Pt tolerated procedure well.

## 2023-08-14 NOTE — Op Note (Signed)
 NAME:  Nathan Reilly                      MEDICAL RECORD NO.:  [id number]                             FACILITY:  [facility]      PHYSICIAN:  Madlyn Frankel. Charlann Boxer, M.D.  DATE OF BIRTH:  May 03, 1941      DATE OF PROCEDURE:  08/14/2023                                     OPERATIVE REPORT         PREOPERATIVE DIAGNOSIS:  Right knee osteoarthritis.      POSTOPERATIVE DIAGNOSIS:  Right knee osteoarthritis.      FINDINGS:  The patient was noted to have complete loss of cartilage and   bone-on-bone arthritis with associated osteophytes in the medial and patellofemoral compartments of   the knee with a significant synovitis and associated effusion.  The patient had failed months of conservative treatment including medications, injection therapy, activity modification.     PROCEDURE:  Right total knee replacement.      COMPONENTS USED:  DePuy Attune FB CR MS knee   system, a size 6N femur, 7 tibia, size 5 mm CR MS AOX insert, and 38 anatomic patellar   button.      SURGEON:  Madlyn Frankel. Charlann Boxer, M.D.      Surgical team assist     ANESTHESIA:  Regional and Spinal.      SPECIMENS:  None.      COMPLICATION:  None.      DRAINS:  None.  EBL: 100 cc      TOURNIQUET TIME:   Total Tourniquet Time Documented: Thigh (Right) - 33 minutes Total: Thigh (Right) - 33 minutes  .      The patient was stable to the recovery room.      INDICATION FOR PROCEDURE:  Nathan Reilly is a 83 y.o. male patient of   mine.  The patient had been seen, evaluated, and treated for months conservatively in the   office with medication, activity modification, and injections.  The patient had   radiographic changes of bone-on-bone arthritis with endplate sclerosis and osteophytes noted.  Based on the radiographic changes and failed conservative measures, the patient   decided to proceed with definitive treatment, total knee replacement.  Risks of infection, DVT, component failure, need for revision surgery, neurovascular  injury were reviewed in the office setting.  The postop course was reviewed stressing the efforts to maximize post-operative satisfaction and function.  Consent was obtained for benefit of pain   relief.      PROCEDURE IN DETAIL:  The patient was brought to the operative theater.   Once adequate anesthesia, preoperative antibiotics, 2 gm of Ancef,1 gm of Tranexamic Acid, and 10 mg of Decadron administered, the patient was positioned supine with a right thigh tourniquet placed.  The  right lower extremity was prepped and draped in sterile fashion.  A time-   out was performed identifying the patient, planned procedure, and the appropriate extremity.      The right lower extremity was placed in the [facility] leg holder.  The leg was   exsanguinated, tourniquet elevated to 225 mmHg.  A midline incision was   made followed by  median parapatellar arthrotomy.  Following initial   exposure, attention was first directed to the patella.  Precut   measurement was noted to be 24 mm.  I resected down to 14 mm and used a   38 anatomic patellar button to restore patellar height as well as cover the cut surface.      The lug holes were drilled and a metal shim was placed to protect the   patella from retractors and saw blade during the procedure.      At this point, attention was now directed to the femur.  The femoral   canal was opened with a drill, irrigated to try to prevent fat emboli.  An   intramedullary rod was passed at 5 degrees valgus, 9 mm of bone was   resected off the distal femur.  Following this resection, the tibia was   subluxated anteriorly.  Using the extramedullary guide, 2 mm of bone was resected off   the proximal medial tibia.  We confirmed the gap would be   stable medially and laterally with a size 5 spacer block as well as confirmed that the tibial cut was perpendicular in the coronal plane, checking with an alignment rod.      Once this was done, I sized the femur to be a size 6  in the anterior-   posterior dimension, chose a narrow component based on medial and   lateral dimension.  The size 6 rotation block was then pinned in   position anterior referenced using the C-clamp to set rotation.  The   anterior, posterior, and  chamfer cuts were made without difficulty nor   notching making certain that I was along the anterior cortex to help   with flexion gap stability.      The final box cut was made off the lateral aspect of distal femur.      At this point, the tibia was sized to be a size 7.  The size 7 tray was   then pinned in position through the medial third of the tubercle,   drilled, and keel punched.  Trial reduction was now carried with a 6 femur,  7 tibia, a size 6 mm CR MS insert, and the 38 anatomic patella botton.  The knee was brought to full extension with good flexion stability with the patella   tracking through the trochlea without application of pressure.  Given   all these findings the trial components removed.  Final components were   opened and cement was mixed.  The knee was irrigated with normal saline solution and pulse lavage.  The synovial lining was   then injected with 30 cc of 0.25% Marcaine with epinephrine, 1 cc of Toradol and 30 cc of NS for a total of 61 cc.     Final implants were then cemented onto cleaned and dried cut surfaces of bone with the knee brought to extension with a size 6 mm CR MS trial insert.      Once the cement had fully cured, excess cement was removed   throughout the knee.  I confirmed that I was satisfied with the range of   motion and stability, and the final size 6 mm CR MS AOX insert was chosen.  It was   placed into the knee.      The tourniquet had been let down at 33 minutes.  No significant   hemostasis was required.  The extensor mechanism was then reapproximated using #1 Vicryl  and #1 Stratafix sutures with the knee   in flexion.  The   remaining wound was closed with 2-0 Vicryl and running 4-0  Monocryl.   The knee was cleaned, dried, dressed sterilely using Dermabond and   Aquacel dressing.  The patient was then   brought to recovery room in stable condition, tolerating the procedure   well.            Madlyn Frankel Charlann Boxer, M.D.    08/14/2023 9:08 AM

## 2023-08-14 NOTE — Transfer of Care (Signed)
 Immediate Anesthesia Transfer of Care Note  Patient: Nathan Reilly  Procedure(s) Performed: ARTHROPLASTY, KNEE, TOTAL (Right: Knee)  Patient Location: PACU  Anesthesia Type:Regional and Spinal  Level of Consciousness: awake, alert , and oriented  Airway & Oxygen Therapy: Patient Spontanous Breathing  Post-op Assessment: Report given to RN and Post -op Vital signs reviewed and stable  Post vital signs: Reviewed and stable  Last Vitals:  Vitals Value Taken Time  BP 110/73 08/14/23 0917  Temp 97.4   Pulse 64 08/14/23 0921  Resp 18 08/14/23 0921  SpO2 93 % 08/14/23 0921  Vitals shown include unfiled device data.  Last Pain:  Vitals:   08/14/23 0606  TempSrc:   PainSc: 0-No pain         Complications: No notable events documented.

## 2023-08-14 NOTE — Interval H&P Note (Signed)
 History and Physical Interval Note:  08/14/2023 6:06 AM  Nathan Reilly  has presented today for surgery, with the diagnosis of Right knee osteoarthritis.  The various methods of treatment have been discussed with the patient and family. After consideration of risks, benefits and other options for treatment, the patient has consented to  Procedure(s): ARTHROPLASTY, KNEE, TOTAL (Right) as a surgical intervention.  The patient's history has been reviewed, patient examined, no change in status, stable for surgery.  I have reviewed the patient's chart and labs.  Questions were answered to the patient's satisfaction.     [name]

## 2023-08-14 NOTE — Anesthesia Postprocedure Evaluation (Signed)
 Anesthesia Post Note  Patient: Nathan Reilly  Procedure(s) Performed: ARTHROPLASTY, KNEE, TOTAL (Right: Knee)     Patient location during evaluation: Nursing Unit Anesthesia Type: Regional and Spinal Level of consciousness: oriented and awake and alert Pain management: pain level controlled Vital Signs Assessment: post-procedure vital signs reviewed and stable Respiratory status: spontaneous breathing and respiratory function stable Cardiovascular status: blood pressure returned to baseline and stable Postop Assessment: no headache, no backache, no apparent nausea or vomiting and patient able to bend at knees Anesthetic complications: no  No notable events documented.  Last Vitals:  Vitals:   08/14/23 1230 08/14/23 1251  BP: 133/77 129/80  Pulse: 60 (!) 57  Resp: (!) 21 16  Temp:  36.4 C  SpO2: 98% 97%    Last Pain:  Vitals:   08/14/23 1251  TempSrc: Oral  PainSc: 2                  Trevor Iha

## 2023-08-14 NOTE — Anesthesia Procedure Notes (Signed)
 Anesthesia Regional Block: Adductor canal block   Pre-Anesthetic Checklist: , timeout performed,  Correct Patient, Correct Site, Correct Laterality,  Correct Procedure, Correct Position, site marked,  Risks and benefits discussed,  Surgical consent,  Pre-op evaluation,  At surgeon's request and post-op pain management  Laterality: Right and Lower  Prep: chloraprep       Needles:  Injection technique: Single-shot  Needle Type: Echogenic Needle     Needle Length: 9cm  Needle Gauge: 22     Additional Needles:   Procedures:,,,, ultrasound used (permanent image in chart),,    Narrative:  Start time: 08/14/2023 7:06 AM End time: 08/14/2023 7:12 AM Injection made incrementally with aspirations every 5 mL.  Performed by: Personally  Anesthesiologist: Trevor Iha, MD  Additional Notes: Block assessed prior to surgery. Pt tolerated procedure well.

## 2023-08-14 NOTE — Evaluation (Signed)
 Physical Therapy Evaluation Patient Details Name: Nathan Reilly MRN: [id number] DOB: Apr 01, 1941 Today's Date: 08/14/2023  History of Present Illness  83 yo S/P RTKA. PMH L THA, LTKA, HTN,hypothyroid  Clinical Impression  The patient  ambulated x 120' using RW. Patient should progress to Dc home  soon. Patient has DME and family support. Patient will benefit from PT while in acute care.        If plan is discharge home, recommend the following: A little help with bathing/dressing/bathroom;Assist for transportation;Help with stairs or ramp for entrance   Can travel by private vehicle        Equipment Recommendations None recommended by PT  Recommendations for Other Services       Functional Status Assessment Patient has had a recent decline in their functional status and demonstrates the ability to make significant improvements in function in a reasonable and predictable amount of time.     Precautions / Restrictions Precautions Precautions: Knee;Fall Restrictions Weight Bearing Restrictions Per Provider Order: No      Mobility  Bed Mobility Overal bed mobility: Needs Assistance Bed Mobility: Supine to Sit     Supine to sit: Supervision          Transfers Overall transfer level: Needs assistance Equipment used: Rolling walker (2 wheels) Transfers: Sit to/from Stand Sit to Stand: Contact guard assist           General transfer comment: cues for safety    Ambulation/Gait Ambulation/Gait assistance: Contact guard assist Gait Distance (Feet): 120 Feet Assistive device: Rolling walker (2 wheels) Gait Pattern/deviations: Step-to pattern, Step-through pattern       General Gait Details: gait steady  Stairs            Wheelchair Mobility     Tilt Bed    Modified Rankin (Stroke Patients Only)       Balance Overall balance assessment: No apparent balance deficits (not formally assessed)                                            Pertinent Vitals/Pain Pain Assessment Pain Assessment: 0-10 Pain Score: 4  Pain Location: right knee Pain Descriptors / Indicators: Discomfort Pain Intervention(s): Monitored during session, Ice applied, Premedicated before session    Home Living Family/patient expects to be discharged to:: Private residence Living Arrangements: Spouse/significant other Available Help at Discharge: Family;Available 24 hours/day Type of Home: House Home Access: Level entry     Alternate Level Stairs-Number of Steps: Can stay downstairs Home Layout: Two level;Able to live on main level with bedroom/bathroom Home Equipment: Rolling Walker (2 wheels);Cane - single point;Toilet riser      Prior Function Prior Level of Function : Independent/Modified Independent;Driving                     Extremity/Trunk Assessment   Upper Extremity Assessment Upper Extremity Assessment: Overall WFL for tasks assessed    Lower Extremity Assessment Lower Extremity Assessment: RLE deficits/detail RLE Deficits / Details: +SLR, Knee flex  5-70 RLE Coordination: WNL    Cervical / Trunk Assessment Cervical / Trunk Assessment: Normal  Communication   Communication Communication: No apparent difficulties    Cognition Arousal: Alert Behavior During Therapy: WFL for tasks assessed/performed   PT - Cognitive impairments: No apparent impairments  Following commands: Intact       Cueing       General Comments      Exercises Total Joint Exercises Ankle Circles/Pumps: AROM, Both Quad Sets: AROM, Both   Assessment/Plan    PT Assessment Patient needs continued PT services  PT Problem List Decreased strength;Decreased range of motion;Decreased mobility;Decreased activity tolerance;Pain       PT Treatment Interventions DME instruction;Functional mobility training;Patient/family education;Gait training;Therapeutic activities;Therapeutic exercise;Stair  training    PT Goals (Current goals can be found in the Care Plan section)  Acute Rehab PT Goals Patient Stated Goal: go home PT Goal Formulation: With patient/family Time For Goal Achievement: 08/21/23 Potential to Achieve Goals: Good    Frequency 7X/week     Co-evaluation               AM-PAC PT "6 Clicks" Mobility  Outcome Measure Help needed turning from your back to your side while in a flat bed without using bedrails?: None Help needed moving from lying on your back to sitting on the side of a flat bed without using bedrails?: None Help needed moving to and from a bed to a chair (including a wheelchair)?: A Little Help needed standing up from a chair using your arms (e.g., wheelchair or bedside chair)?: A Little Help needed to walk in hospital room?: A Little Help needed climbing 3-5 steps with a railing? : A Little 6 Click Score: 20    End of Session Equipment Utilized During Treatment: Gait belt Activity Tolerance: Patient tolerated treatment well Patient left: with call bell/phone within reach;in chair;with family/visitor present Nurse Communication: Mobility status PT Visit Diagnosis: Difficulty in walking, not elsewhere classified (R26.2);Pain Pain - Right/Left: Right Pain - part of body: Knee    Time: 1610-9604 PT Time Calculation (min) (ACUTE ONLY): 29 min   Charges:   PT Evaluation $PT Eval Low Complexity: 1 Low PT Treatments $Gait Training: 8-22 mins PT General Charges $$ ACUTE PT VISIT: 1 Visit         Blanchard Kelch PT Acute Rehabilitation Services Office [phone] Weekend pager-[phone]   Rada Hay 08/14/2023, 4:29 PM

## 2023-08-14 NOTE — Plan of Care (Signed)

## 2023-08-15 ENCOUNTER — Encounter (HOSPITAL_COMMUNITY): Payer: Self-pay | Admitting: Orthopedic Surgery

## 2023-08-15 ENCOUNTER — Other Ambulatory Visit: Payer: Self-pay

## 2023-08-15 DIAGNOSIS — M1711 Unilateral primary osteoarthritis, right knee: Secondary | ICD-10-CM | POA: Diagnosis not present

## 2023-08-15 LAB — CBC
HCT: 39.9 % (ref 39.0–52.0)
Hemoglobin: 12.9 g/dL — ABNORMAL LOW (ref 13.0–17.0)
MCH: 30.2 pg (ref 26.0–34.0)
MCHC: 32.3 g/dL (ref 30.0–36.0)
MCV: 93.4 fL (ref 80.0–100.0)
Platelets: 160 10*3/uL (ref 150–400)
RBC: 4.27 MIL/uL (ref 4.22–5.81)
RDW: 13.9 % (ref 11.5–15.5)
WBC: 12.9 10*3/uL — ABNORMAL HIGH (ref 4.0–10.5)
nRBC: 0 % (ref 0.0–0.2)

## 2023-08-15 LAB — BASIC METABOLIC PANEL
Anion gap: 10 (ref 5–15)
BUN: 19 mg/dL (ref 8–23)
CO2: 22 mmol/L (ref 22–32)
Calcium: 8.3 mg/dL — ABNORMAL LOW (ref 8.9–10.3)
Chloride: 105 mmol/L (ref 98–111)
Creatinine, Ser: 1.01 mg/dL (ref 0.61–1.24)
GFR, Estimated: >60 mL/min (ref >60)
Glucose, Bld: 117 mg/dL — ABNORMAL HIGH (ref 70–99)
Potassium: 4.1 mmol/L (ref 3.5–5.1)
Sodium: 137 mmol/L (ref 135–145)

## 2023-08-15 MED ORDER — ACETAMINOPHEN 500 MG PO TABS: 1000.0000 mg | ORAL_TABLET | Freq: Four times a day (QID) | ORAL | 0 refills | Status: DC

## 2023-08-15 MED ORDER — ASPIRIN 81 MG PO CHEW: 81.0000 mg | CHEWABLE_TABLET | Freq: Two times a day (BID) | ORAL | 0 refills | Status: AC

## 2023-08-15 NOTE — Plan of Care (Signed)

## 2023-08-15 NOTE — Progress Notes (Signed)
 Physical Therapy Treatment Patient Details Name: Nathan Reilly MRN: [id number] DOB: 12-07-1940 Today's Date: 08/15/2023   History of Present Illness 83 yo S/P RTKA. PMH L THA, LTKA, HTN,hypothyroid    PT Comments  POD # 1 am session PT - Cognition Comments: AxO x 3 pleasant and motivated.Marland Kitchen Spouse and 2 Sons present during session. Assisted OOB in which pt was self able to perform with increased time.  Assisted with amb in hallway.  General transfer comment: one VC on proper hand placement, Pt self able.  General Gait Details: 25% VC's on proper walker to self distance as well as safety with turns. Tolerated a functional distance of 110 feet.  Then returned to room to perform some TE's following HEP handout.  Instructed on proper tech, freq as well as use of ICE.  Educated on ICE Man machine.   Will see Pt again to practice "one step he has to get to bedroom".  Entry level.    If plan is discharge home, recommend the following: A little help with bathing/dressing/bathroom;Assist for transportation;Help with stairs or ramp for entrance   Can travel by private vehicle        Equipment Recommendations  None recommended by PT    Recommendations for Other Services       Precautions / Restrictions Precautions Precautions: Knee;Fall Precaution/Restrictions Comments: no pillow under knee Restrictions Weight Bearing Restrictions Per Provider Order: No RLE Weight Bearing Per Provider Order: Weight bearing as tolerated     Mobility  Bed Mobility Overal bed mobility: Needs Assistance Bed Mobility: Supine to Sit     Supine to sit: Supervision     General bed mobility comments: Pt self able to "slide" R LE off bed with increased time.    Transfers Overall transfer level: Needs assistance Equipment used: Rolling walker (2 wheels) Transfers: Sit to/from Stand             General transfer comment: one VC on proper hand placement, Pt self able     Ambulation/Gait Ambulation/Gait assistance: Contact guard assist Gait Distance (Feet): 110 Feet Assistive device: Rolling walker (2 wheels) Gait Pattern/deviations: Step-to pattern, Step-through pattern Gait velocity: decreased     General Gait Details: 25% VC's on proper walker to self distance as well as safety with turns.   Stairs             Wheelchair Mobility     Tilt Bed    Modified Rankin (Stroke Patients Only)       Balance                                            Communication Communication Communication: No apparent difficulties  Cognition Arousal: Alert Behavior During Therapy: WFL for tasks assessed/performed   PT - Cognitive impairments: No apparent impairments                       PT - Cognition Comments: AxO x 3 pleasant and motivated.Marland Kitchen Spouse and 2 Sons present during session. Following commands: Intact      Cueing    Exercises  Total Knee Replacement TE's following HEP handout 10 reps B LE ankle pumps 05 reps towel squeezes 05 reps knee presses 05 reps heel slides  05 reps SAQ's 05 reps SLR's 05 reps ABD Educated on use of gait belt to assist with TE's Followed by  ICE     General Comments        Pertinent Vitals/Pain Pain Assessment Pain Assessment: 0-10 Pain Score: 5  Pain Location: right knee Pain Descriptors / Indicators: Discomfort, Tender, Tightness, Operative site guarding Pain Intervention(s): Monitored during session, Premedicated before session, Repositioned, Ice applied    Home Living Family/patient expects to be discharged to:: Private residence Living Arrangements: Spouse/significant other                      Prior Function            PT Goals (current goals can now be found in the care plan section) Progress towards PT goals: Progressing toward goals    Frequency    7X/week      PT Plan      Co-evaluation              AM-PAC PT "6 Clicks"  Mobility   Outcome Measure  Help needed turning from your back to your side while in a flat bed without using bedrails?: None Help needed moving from lying on your back to sitting on the side of a flat bed without using bedrails?: None Help needed moving to and from a bed to a chair (including a wheelchair)?: None Help needed standing up from a chair using your arms (e.g., wheelchair or bedside chair)?: None Help needed to walk in hospital room?: A Little Help needed climbing 3-5 steps with a railing? : A Little 6 Click Score: 22    End of Session Equipment Utilized During Treatment: Gait belt Activity Tolerance: Patient tolerated treatment well Patient left: with call bell/phone within reach;in chair;with family/visitor present Nurse Communication: Mobility status PT Visit Diagnosis: Difficulty in walking, not elsewhere classified (R26.2);Pain Pain - Right/Left: Right Pain - part of body: Knee     Time: 4098-1191 PT Time Calculation (min) (ACUTE ONLY): 25 min  Charges:    $Gait Training: 8-22 mins $Therapeutic Exercise: 8-22 mins PT General Charges $$ ACUTE PT VISIT: 1 Visit                     Felecia Shelling  PTA Acute  Rehabilitation Services Office M-F          [phone]

## 2023-08-15 NOTE — Plan of Care (Signed)
 Patient discharged home via private vehicle with two sons, following PT sessions and medication administration. AVS and discharge instruction provided and patient verbalizes understanding. Patient and family confirm that he has pain management medications in home and he is instructed to take these as per the instructions provided by ortho prior to his surgery.  Haydee Salter, RN 08/15/23 12:36 PM

## 2023-08-15 NOTE — Progress Notes (Signed)
 Patient ID: Nathan Reilly, male   DOB: 1941/01/07, 83 y.o.   MRN: [id number] Subjective: 1 Day Post-Op Procedure(s) (LRB): ARTHROPLASTY, KNEE, TOTAL (Right)    Patient reports pain as mild. Doing well.  Notes muscles spasms or fasciculations   [birth year] today!!  Objective:   VITALS:   Vitals:   08/15/23 0143 08/15/23 0516  BP: 133/80 134/77  Pulse: (!) 52 (!) 52  Resp: 17 17  Temp: 97.7 F (36.5 C) 98.7 F (37.1 C)  SpO2: 97% 96%    Neurovascular intact Incision: dressing C/D/I  LABS Recent Labs    08/15/23 0324  HGB 12.9*  HCT 39.9  WBC 12.9*  PLT 160    Recent Labs    08/15/23 0324  NA 137  K 4.1  BUN 19  CREATININE 1.01  GLUCOSE 117*    No results for input(s): "LABPT", "INR" in the last 72 hours.   Assessment/Plan: 1 Day Post-Op Procedure(s) (LRB): ARTHROPLASTY, KNEE, TOTAL (Right)   Advance diet Up with therapy Home today after therapy Post op meds already at home with instructions Reviewed goals and ice use

## 2023-08-15 NOTE — Discharge Instructions (Signed)

## 2023-08-15 NOTE — Progress Notes (Signed)
 Physical Therapy Treatment Patient Details Name: Nathan Reilly MRN: [id number] DOB: 02/13/1941 Today's Date: 08/15/2023   History of Present Illness 83 yo S/P RTKA. PMH L THA, LTKA, HTN,hypothyroid    PT Comments  POD # 1 pm session Spouse and 2 Sons present.  Assisted with amb in hallway and practiced ONE step Pt has to enter his bedroom.  Addressed all mobility questions, discussed appropriate activity, educated on use of ICE.  Pt ready for D/C to home.    If plan is discharge home, recommend the following: A little help with bathing/dressing/bathroom;Assist for transportation;Help with stairs or ramp for entrance   Can travel by private vehicle        Equipment Recommendations  None recommended by PT    Recommendations for Other Services       Precautions / Restrictions Precautions Precautions: Knee;Fall Precaution/Restrictions Comments: no pillow under knee Restrictions Weight Bearing Restrictions Per Provider Order: No RLE Weight Bearing Per Provider Order: Weight bearing as tolerated     Mobility  Bed Mobility     General bed mobility comments: OOB in recliner    Transfers Overall transfer level: Needs assistance Equipment used: Rolling walker (2 wheels) Transfers: Sit to/from Stand             General transfer comment: one VC on proper hand placement, Pt self able    Ambulation/Gait Ambulation/Gait assistance: Contact guard assist Gait Distance (Feet): 110 Feet Assistive device: Rolling walker (2 wheels) Gait Pattern/deviations: Step-to pattern, Step-through pattern Gait velocity: decreased     General Gait Details: 25% VC's on proper walker to self distance as well as safety with turns.  Had Pt amb with Son "hands on" using a safety belt.   Stairs Stairs: Yes Stairs assistance: Supervision, Contact guard assist Stair Management: No rails, Step to pattern, Forwards, With walker Number of Stairs: 1 General stair comments: With Son "hands on"  using a safety belt, VC's on proper walker placement as well as safety with sequencing.   Wheelchair Mobility     Tilt Bed    Modified Rankin (Stroke Patients Only)       Balance                                            Communication Communication Communication: No apparent difficulties  Cognition Arousal: Alert Behavior During Therapy: WFL for tasks assessed/performed   PT - Cognitive impairments: No apparent impairments                       PT - Cognition Comments: AxO x 3 pleasant and motivated.Marland Kitchen Spouse and 2 Sons present during session. Following commands: Intact      Cueing    Exercises      General Comments        Pertinent Vitals/Pain Pain Assessment Pain Assessment: 0-10 Pain Score: 5  Pain Location: right knee Pain Descriptors / Indicators: Discomfort, Tender, Tightness, Operative site guarding Pain Intervention(s): Monitored during session, Premedicated before session, Repositioned, Ice applied    Home Living Family/patient expects to be discharged to:: Private residence Living Arrangements: Spouse/significant other                      Prior Function            PT Goals (current goals can now be found in the  care plan section) Progress towards PT goals: Progressing toward goals    Frequency    7X/week      PT Plan      Co-evaluation              AM-PAC PT "6 Clicks" Mobility   Outcome Measure  Help needed turning from your back to your side while in a flat bed without using bedrails?: None Help needed moving from lying on your back to sitting on the side of a flat bed without using bedrails?: None Help needed moving to and from a bed to a chair (including a wheelchair)?: None Help needed standing up from a chair using your arms (e.g., wheelchair or bedside chair)?: None Help needed to walk in hospital room?: A Little Help needed climbing 3-5 steps with a railing? : A Little 6 Click  Score: 22    End of Session Equipment Utilized During Treatment: Gait belt Activity Tolerance: Patient tolerated treatment well Patient left: with call bell/phone within reach;in chair;with family/visitor present Nurse Communication: Mobility status PT Visit Diagnosis: Difficulty in walking, not elsewhere classified (R26.2);Pain Pain - Right/Left: Right Pain - part of body: Knee     Time: 1027-2536 PT Time Calculation (min) (ACUTE ONLY): 12 min  Charges:    $Gait Training: 8-22 mins PT General Charges $$ ACUTE PT VISIT: 1 Visit                    Felecia Shelling  PTA Acute  Rehabilitation Services Office M-F          [phone]

## 2023-08-15 NOTE — TOC Transition Note (Signed)
 Transition of Care [facility]) - Discharge Note   Patient Details  Name: Nathan Reilly MRN: [id number] Date of Birth: November 14, 1940  [facility] CM/SW Contact:  Diona Browner, LCSW Phone Number: 08/15/2023, 11:02 AM   Clinical Narrative:    Pt to d/c home with wife. Pt has RW at home, no additional DME needs. NO TOC needs   Final next level of care: Home/Self Care Barriers to Discharge: No Barriers Identified   Patient Goals and CMS Choice Patient states their goals for this hospitalization and ongoing recovery are:: return home   Choice offered to / list presented to : NA [facility] ownership interest in [facility].provided to::  (NA)    Discharge Placement                       Discharge Plan and Services Additional resources added to the After Visit Summary for                  DME Arranged: N/A DME Agency: NA                  Social Drivers of Health (SDOH) Interventions SDOH Screenings   Food Insecurity: No Food Insecurity (01/14/2023)  Housing: Low Risk  (01/14/2023)  Transportation Needs: No Transportation Needs (01/14/2023)  Utilities: Not At Risk (01/14/2023)  Alcohol Screen: Low Risk  (11/18/2022)  Depression (PHQ2-9): Low Risk  (08/19/2022)  Financial Resource Strain: Low Risk  (11/18/2022)  Physical Activity: Insufficiently Active (11/18/2022)  Social Connections: Socially Integrated (11/18/2022)  Stress: Stress Concern Present (11/18/2022)  Tobacco Use: Low Risk  (08/14/2023)     Readmission Risk Interventions     No data to display

## 2023-08-29 DIAGNOSIS — M25561 Pain in right knee: Secondary | ICD-10-CM | POA: Diagnosis not present

## 2023-10-06 DIAGNOSIS — Z5189 Encounter for other specified aftercare: Secondary | ICD-10-CM | POA: Diagnosis not present

## 2023-10-30 DIAGNOSIS — Z85820 Personal history of malignant melanoma of skin: Secondary | ICD-10-CM | POA: Diagnosis not present

## 2023-10-30 DIAGNOSIS — D485 Neoplasm of uncertain behavior of skin: Secondary | ICD-10-CM | POA: Diagnosis not present

## 2023-10-30 DIAGNOSIS — L820 Inflamed seborrheic keratosis: Secondary | ICD-10-CM | POA: Diagnosis not present

## 2023-10-30 DIAGNOSIS — L570 Actinic keratosis: Secondary | ICD-10-CM | POA: Diagnosis not present

## 2023-10-30 DIAGNOSIS — L578 Other skin changes due to chronic exposure to nonionizing radiation: Secondary | ICD-10-CM | POA: Diagnosis not present

## 2023-10-30 DIAGNOSIS — Z85828 Personal history of other malignant neoplasm of skin: Secondary | ICD-10-CM | POA: Diagnosis not present

## 2023-10-30 DIAGNOSIS — L821 Other seborrheic keratosis: Secondary | ICD-10-CM | POA: Diagnosis not present

## 2023-10-30 DIAGNOSIS — Z86018 Personal history of other benign neoplasm: Secondary | ICD-10-CM | POA: Diagnosis not present

## 2023-10-30 DIAGNOSIS — D225 Melanocytic nevi of trunk: Secondary | ICD-10-CM | POA: Diagnosis not present

## 2023-10-31 ENCOUNTER — Telehealth: Payer: Self-pay

## 2023-10-31 NOTE — Telephone Encounter (Signed)
 This patient is appearing on a report for being at risk of failing the adherence measure for hypertension (ACEi/ARB) medications this calendar year.   Medication: lisinopril  10 mg Last fill date: 06/16/23 for 90 day supply  Spoke with patient who stated he believes he has plenty of lisinopril  at home. He will check once he gets back home. Informed patient that the script at the pharmacy has refills remaining, so he can give them a call if he realizes he is out or close to being out.  Of note, patient also reports a small, firm deformity near his right nipple that he would like examined. Provided patient with PCP phone number to schedule visit.  Abelina Abide, PharmD PGY1 Pharmacy Resident 10/31/2023 3:42 PM

## 2023-11-04 ENCOUNTER — Ambulatory Visit (INDEPENDENT_AMBULATORY_CARE_PROVIDER_SITE_OTHER): Admitting: Internal Medicine

## 2023-11-04 ENCOUNTER — Encounter: Payer: Self-pay | Admitting: Internal Medicine

## 2023-11-04 VITALS — BP 108/68 | HR 58 | Temp 97.7°F | Ht 69.0 in | Wt 178.6 lb

## 2023-11-04 DIAGNOSIS — R222 Localized swelling, mass and lump, trunk: Secondary | ICD-10-CM | POA: Insufficient documentation

## 2023-11-04 NOTE — Assessment & Plan Note (Signed)
 With several family members with breast cancer will get mammogram and US  right breast area. No LN detected on exam. This is located under nipple.

## 2023-11-04 NOTE — Progress Notes (Signed)
   Subjective:   Patient ID: Nathan Reilly, male    DOB: 15-Jun-1940, 83 y.o.   MRN: [id number]  HPI The patient is an 83 YO man coming in for lump in right nipple region. Noticed in within 6 months. Non painful and has not changed in size since then. No nipple discharge. There is multiple male family members with breast cancer.   Review of Systems  Constitutional: Negative.   HENT: Negative.    Eyes: Negative.   Respiratory:  Negative for cough, chest tightness and shortness of breath.   Cardiovascular:  Negative for chest pain, palpitations and leg swelling.  Gastrointestinal:  Negative for abdominal distention, abdominal pain, constipation, diarrhea, nausea and vomiting.  Musculoskeletal: Negative.   Skin: Negative.   Neurological: Negative.   Psychiatric/Behavioral: Negative.      Objective:  Physical Exam Constitutional:      Appearance: He is well-developed.  HENT:     Head: Normocephalic and atraumatic.  Cardiovascular:     Rate and Rhythm: Normal rate and regular rhythm.     Comments: Right chest wall with firm lump under right nipple, no nipple discharge. No axillary LN present, left chest region without masses or LN  Pulmonary:     Effort: Pulmonary effort is normal. No respiratory distress.     Breath sounds: Normal breath sounds. No wheezing or rales.  Abdominal:     General: Bowel sounds are normal. There is no distension.     Palpations: Abdomen is soft.     Tenderness: There is no abdominal tenderness. There is no rebound.  Musculoskeletal:     Cervical back: Normal range of motion.  Skin:    General: Skin is warm and dry.  Neurological:     Mental Status: He is alert and oriented to person, place, and time.     Coordination: Coordination normal.     Vitals:   11/04/23 1017  BP: 108/68  Pulse: (!) 58  Temp: 97.7 F (36.5 C)  TempSrc: Oral  SpO2: 99%  Weight: 178 lb 9.6 oz (81 kg)  Height: 5\' 9"  (1.753 m)    Assessment & Plan:

## 2023-11-13 ENCOUNTER — Ambulatory Visit
Admission: RE | Admit: 2023-11-13 | Discharge: 2023-11-13 | Disposition: A | Discharge status: Home / Self Care | Source: Ambulatory Visit | Attending: Internal Medicine | Admitting: Internal Medicine

## 2023-11-13 ENCOUNTER — Ambulatory Visit: Admission: RE | Admit: 2023-11-13 | Source: Ambulatory Visit

## 2023-11-13 DIAGNOSIS — R222 Localized swelling, mass and lump, trunk: Secondary | ICD-10-CM

## 2023-11-13 DIAGNOSIS — N62 Hypertrophy of breast: Secondary | ICD-10-CM | POA: Diagnosis not present

## 2023-11-14 ENCOUNTER — Ambulatory Visit: Payer: Self-pay | Admitting: Internal Medicine

## 2024-01-10 ENCOUNTER — Other Ambulatory Visit: Payer: Self-pay | Admitting: Internal Medicine

## 2024-02-13 ENCOUNTER — Ambulatory Visit (INDEPENDENT_AMBULATORY_CARE_PROVIDER_SITE_OTHER)

## 2024-02-13 VITALS — BP 110/71 | HR 68 | Ht 68.5 in | Wt 175.2 lb

## 2024-02-13 DIAGNOSIS — Z0000 Encounter for general adult medical examination without abnormal findings: Secondary | ICD-10-CM | POA: Diagnosis not present

## 2024-02-13 NOTE — Progress Notes (Signed)
 Subjective:   Nathan Reilly is a 83 y.o. who presents for a Medicare Wellness preventive visit.  As a reminder, Annual Wellness Visits don't include a physical exam, and some assessments may be limited, especially if this visit is performed virtually. We may recommend an in-person follow-up visit with your provider if needed.  Visit Complete: In person  Persons Participating in Visit: Patient.  AWV Questionnaire: Yes: Patient Medicare AWV questionnaire was completed by the patient on 02/06/2024; I have confirmed that all information answered by patient is correct and no changes since this date.  Cardiac Risk Factors include: hypertension;advanced age (>49men, >79 women);male gender;Other (see comment), Risk factor comments: BPH     Objective:    Today's Vitals   02/13/24 0900  Weight: 175 lb 3.2 oz (79.5 kg)  Height: 5' 8.5 (1.74 m)   Body mass index is 26.25 kg/m.     02/13/2024    9:06 AM 08/15/2023   10:43 AM 08/04/2023    7:47 AM 01/14/2023   12:36 PM 01/02/2023    8:18 AM 03/30/2021   10:23 AM 01/26/2021   10:46 AM  Advanced Directives  Does Patient Have a Medical Advance Directive? Yes Yes Yes Yes Yes Yes Yes  Type of Estate agent of [place];Living will Healthcare Power of [place];Living will Healthcare Power of [place];Living will Healthcare Power of [place];Living will Healthcare Power of [place];Living will  Healthcare Power of [place];Living will  Does patient want to make changes to medical advance directive? No - Patient declined No - Patient declined  No - Patient declined No - Patient declined No - Patient declined   Copy of Healthcare Power of Attorney in Chart? Yes - validated most recent copy scanned in chart (See row information)   No - copy requested       Current Medications (verified) Outpatient Encounter Medications as of 02/13/2024  Medication Sig   doxazosin  (CARDURA ) 4 MG tablet TAKE 1 TABLET BY MOUTH EVERY DAY    levothyroxine  (SYNTHROID ) 50 MCG tablet Take 1 tablet (50 mcg total) by mouth daily.   lisinopril  (ZESTRIL ) 10 MG tablet TAKE 1 TABLET BY MOUTH EVERY DAY   Polyethyl Glycol-Propyl Glycol (LUBRICANT EYE DROPS) 0.4-0.3 % SOLN Place 1-2 drops into both eyes 3 (three) times daily as needed (dry/irritated eyes.).   acetaminophen  (TYLENOL ) 500 MG tablet Take 2 tablets (1,000 mg total) by mouth every 6 (six) hours. (Patient not taking: Reported on 11/04/2023)   albuterol  (VENTOLIN  HFA) 108 (90 Base) MCG/ACT inhaler TAKE 2 PUFFS BY MOUTH EVERY 6 HOURS AS NEEDED FOR WHEEZE OR SHORTNESS OF BREATH (Patient not taking: Reported on 11/04/2023)   Wheat Dextrin (BENEFIBER DRINK MIX) PACK Take 1 packet by mouth daily. (Patient not taking: Reported on 02/13/2024)   No facility-administered encounter medications on file as of 02/13/2024.    Allergies (verified) Patient has no known allergies.   History: Past Medical History:  Diagnosis Date   Arthritis    BPH with urinary obstruction    Foley catheter in place    History of basal cell carcinoma (BCC) of skin    History of Clostridioides difficile colitis 01/2021   per pt resolved   Hypertension    Hypothyroidism    Urinary retention 01/2021   Wears glasses    Past Surgical History:  Procedure Laterality Date   BAND HEMORRHOIDECTOMY  2015   Dr Luis   BLEPHAROPLASTY Bilateral 2018   upper eyelids   COLONOSCOPY  2014   Dr Luis  TONSILLECTOMY     child   TOTAL HIP ARTHROPLASTY Left 01/14/2023   Procedure: TOTAL HIP ARTHROPLASTY ANTERIOR APPROACH;  Surgeon: Ernie Cough, MD;  Location: [facility];  Service: Orthopedics;  Laterality: Left;  89   TOTAL KNEE ARTHROPLASTY Left 07/26/2014   Procedure: LEFT TOTAL KNEE ARTHROPLASTY;  Surgeon: Cough JONETTA Ernie, MD;  Location: [facility];  Service: Orthopedics;  Laterality: Left;   TOTAL KNEE ARTHROPLASTY Right 08/14/2023   Procedure: ARTHROPLASTY, KNEE, TOTAL;  Surgeon: Ernie Cough, MD;  Location: [facility];  Service:  Orthopedics;  Laterality: Right;   TRANSURETHRAL RESECTION OF PROSTATE N/A 03/30/2021   Procedure: TRANSURETHRAL RESECTION OF THE PROSTATE (TURP) WITH URETHRAL BIOPSY;  Surgeon: Selma Cough SAUNDERS, MD;  Location[facility];  Service: Urology;  Laterality: N/A;   Family History  Problem Relation Age of Onset   Diabetes Father    Breast cancer Sister    Hypertension Neg Hx    Heart disease Neg Hx    Stroke Neg Hx    Social History   Socioeconomic History   Marital status: Married    Spouse name: Not on file   Number of children: 2   Years of education: 16   Highest education level: Master's degree (e.g., MA, MS, MEng, MEd, MSW, MBA)  Occupational History   Occupation: Part time work    Comment: retired  Tobacco Use   Smoking status: Never   Smokeless tobacco: Never  Vaping Use   Vaping status: Never Used  Substance and Sexual Activity   Alcohol  use: Never   Drug use: Never   Sexual activity: Yes    Partners: Female  Other Topics Concern   Not on file  Social History Narrative   [employer], grad school for teachers certificate.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 Married '68, Marriage in good health. 2 sons- '72, '77; 3 grand-daughters.No complaints regarding sexual health. Retired - Therapist, occupational. ACP - does not want heroic or futile measures in the face of loss of function or quality of life.       Lives with wife/2025   Social Drivers of Health   Financial Resource Strain: Low Risk  (02/06/2024)   Overall Financial Resource Strain (CARDIA)    Difficulty of Paying Living Expenses: Not hard at all  Food Insecurity: No Food Insecurity (02/06/2024)   Hunger Vital Sign    Worried About Running Out of Food in the Last Year: Never true    Ran Out of Food in the Last Year: Never true  Transportation Needs: No Transportation Needs (02/06/2024)   PRAPARE - Administrator, Civil Service (Medical): No    Lack  of Transportation (Non-Medical): No  Physical Activity: Sufficiently Active (02/06/2024)   Exercise Vital Sign    Days of Exercise per Week: 3 days    Minutes of Exercise per Session: 60 min  Stress: Stress Concern Present (02/06/2024)   [employer] of Occupational Health - Occupational Stress Questionnaire    Feeling of Stress: To some extent  Social Connections: Socially Integrated (02/06/2024)   Social Connection and Isolation Panel    Frequency of Communication with Friends and Family: Once a week    Frequency of Social Gatherings with Friends and Family: Three times a week    Attends Religious Services: More than 4 times per year    Active Member of Clubs or Organizations: Yes    Attends Banker Meetings: More than 4  times per year    Marital Status: Married    Tobacco Counseling Counseling given: Not Answered    Clinical Intake:  Pre-visit preparation completed: Yes  Pain : No/denies pain     BMI - recorded: 26.25 Nutritional Status: BMI 25 -29 Overweight Nutritional Risks: None Diabetes: No  Lab Results  Component Value Date   HGBA1C 5.3 02/17/2023   HGBA1C 5.6 01/06/2020   HGBA1C 5.5 07/04/2015     How often do you need to have someone help you when you read instructions, pamphlets, or other written materials from your doctor or pharmacy?: 1 - Never     Information entered by :: Abisola Carrero, RMA   Activities of Daily Living     02/06/2024    1:22 PM 08/15/2023   10:43 AM  In your present state of health, do you have any difficulty performing the following activities:  Hearing? 0 0  Vision? 0 0  Difficulty concentrating or making decisions? 0 0  Walking or climbing stairs? 0   Dressing or bathing? 0   Doing errands, shopping? 0 0  Preparing Food and eating ? N   Using the Toilet? N   In the past six months, have you accidently leaked urine? N   Do you have problems with loss of bowel control? N   Managing your Medications? N    Managing your Finances? N   Housekeeping or managing your Housekeeping? N     Patient Care Team: Rollene Almarie LABOR, MD as PCP - General (Internal Medicine) Cleotilde Sewer, OD (Optometry) [place], Morna SAILOR, [facility] (Inactive) as Pharmacist (Pharmacist)  I have updated your Care Teams any recent Medical Services you may have received from other providers in the past year.     Assessment:   This is a routine wellness examination for Nathan Reilly.  Hearing/Vision screen Hearing Screening - Comments:: Denies hearing difficulties   Vision Screening - Comments:: Wears eyeglasses/[facility]/ Dr. Portia   Goals Addressed               This Visit's Progress     Patient Stated (pt-stated)        Stay in his weight range/2025       Depression Screen     02/13/2024    9:06 AM 11/04/2023   10:16 AM 08/19/2022    8:15 AM 02/15/2022    8:27 AM 01/11/2021    8:01 AM 01/06/2020    8:02 AM 01/04/2019    8:29 AM  PHQ 2/9 Scores  PHQ - 2 Score 0 0 0 0 0 0 0  PHQ- 9 Score 0          Fall Risk     02/06/2024    1:22 PM 11/04/2023   10:16 AM 02/17/2023    8:21 AM 11/20/2022   10:10 AM 08/19/2022    8:15 AM  Fall Risk   Falls in the past year? 0 0 0 0 0  Number falls in past yr: 0 0 0 0 0  Injury with Fall? 0 0 0  0  Risk for fall due to :  No Fall Risks   No Fall Risks  Follow up Falls evaluation completed;Falls prevention discussed Falls evaluation completed Falls evaluation completed  Falls evaluation completed    MEDICARE RISK AT HOME:  Medicare Risk at Home Any stairs in or around the home?: (Patient-Rptd) Yes If so, are there any without handrails?: (Patient-Rptd) No Home free of loose throw rugs in walkways, pet  beds, electrical cords, etc?: (Patient-Rptd) No Adequate lighting in your home to reduce risk of falls?: (Patient-Rptd) Yes Life alert?: (Patient-Rptd) No Use of a cane, walker or w/c?: (Patient-Rptd) No Grab bars in the bathroom?: (Patient-Rptd) No Shower  chair or bench in shower?: (Patient-Rptd) Yes Elevated toilet seat or a handicapped toilet?: (Patient-Rptd) Yes  TIMED UP AND GO:  Was the test performed?  Yes  Length of time to ambulate 10 feet: 15 sec Gait slow and steady without use of assistive device  Cognitive Function: 6CIT completed        Immunizations Immunization History  Administered Date(s) Administered   Fluad Quad(high Dose 65+) 02/12/2019, 02/15/2022, 02/05/2024   INFLUENZA, HIGH DOSE SEASONAL PF 04/06/2014, 04/01/2016, 04/12/2017, 02/18/2018, 03/16/2021   Influenza Split 04/11/2011, 04/02/2012   Influenza Whole 02/15/2010   Influenza, Seasonal, Injecte, Preservative Fre 03/03/2013   Influenza,inj,Quad PF,6+ Mos 02/04/2020   Influenza-Unspecified 03/15/2015, 04/12/2017, 02/18/2018   PFIZER Comirnaty(Gray Top)Covid-19 Tri-Sucrose Vaccine 02/28/2022   PFIZER(Purple Top)SARS-COV-2 Vaccination 07/15/2019, 08/09/2019, 03/06/2020, 10/16/2020, 02/14/2023   Pfizer Covid-19 Vaccine Bivalent Booster 42yrs & up 03/16/2021   Pneumococcal Conjugate-13 07/15/2013   Pneumococcal Polysaccharide-23 02/03/2006, 06/24/2012   Td 02/15/2010   Tdap 12/17/2019   Zoster Recombinant(Shingrix) 03/09/2018, 05/19/2018   Zoster, Live 02/03/2006    Screening Tests Health Maintenance  Topic Date Due   COVID-19 Vaccine (8 - 2025-26 season) 02/09/2024   Medicare Annual Wellness (AWV)  02/12/2025   DTaP/Tdap/Td (3 - Td or Tdap) 12/16/2029   Pneumococcal Vaccine: 50+ Years  Completed   Influenza Vaccine  Completed   Zoster Vaccines- Shingrix  Completed   HPV VACCINES  Aged Out   Meningococcal B Vaccine  Aged Out   Colonoscopy  Discontinued    Health Maintenance  Health Maintenance Due  Topic Date Due   COVID-19 Vaccine (8 - 2025-26 season) 02/09/2024   Health Maintenance Items Addressed: See Nurse Notes at the end of this note  Additional Screening:  Vision Screening: Recommended annual ophthalmology exams for early  detection of glaucoma and other disorders of the eye. Would you like a referral to an eye doctor? No    Dental Screening: Recommended annual dental exams for proper oral hygiene  Community Resource Referral / Chronic Care Management: CRR required this visit?  No   CCM required this visit?  No   Plan:    I have personally reviewed and noted the following in the patient's chart:   Medical and social history Use of alcohol , tobacco or illicit drugs  Current medications and supplements including opioid prescriptions. Patient is not currently taking opioid prescriptions. Functional ability and status Nutritional status Physical activity Advanced directives List of other physicians Hospitalizations, surgeries, and ER visits in previous 12 months Vitals Screenings to include cognitive, depression, and falls Referrals and appointments  In addition, I have reviewed and discussed with patient certain preventive protocols, quality metrics, and best practice recommendations. A written personalized care plan for preventive services as well as general preventive health recommendations were provided to patient.   Stone Spirito L Noell Shular, CMA   02/13/2024   After Visit Summary: (MyChart) Due to this being a telephonic visit, the after visit summary with patients personalized plan was offered to patient via MyChart   Notes: Patient is up to date on all health maintenance. He states that sometimes he feels dizzy when he stands up, occurring around 3-4 times a week.  Patient also stated that he feels slight faintness at times as well.  He would like to  discuss this during his next office

## 2024-02-13 NOTE — Patient Instructions (Signed)
 [name],  Thank you for taking the time for your Medicare Wellness Visit. I appreciate your continued commitment to your health goals. Please review the care plan we discussed, and feel free to reach out if I can assist you further.  Medicare recommends these wellness visits once per year to help you and your care team stay ahead of potential health issues. These visits are designed to focus on prevention, allowing your provider to concentrate on managing your acute and chronic conditions during your regular appointments.  Please note that Annual Wellness Visits do not include a physical exam. Some assessments may be limited, especially if the visit was conducted virtually. If needed, we may recommend a separate in-person follow-up with your provider.  Ongoing Care Seeing your primary care provider every 3 to 6 months helps us  monitor your health and provide consistent, personalized care. Keep up the good work.  Referrals If a referral was made during today's visit and you haven't received any updates within two weeks, please contact the referred provider directly to check on the status.  Recommended Screenings:  Health Maintenance  Topic Date Due   Flu Shot  01/09/2024   COVID-19 Vaccine (7 - 2025-26 season) 02/09/2024   Medicare Annual Wellness Visit  02/17/2024   DTaP/Tdap/Td vaccine (3 - Td or Tdap) 12/16/2029   Pneumococcal Vaccine for age over 28  Completed   Zoster (Shingles) Vaccine  Completed   HPV Vaccine  Aged Out   Meningitis B Vaccine  Aged Out   Colon Cancer Screening  Discontinued       08/15/2023   10:43 AM  Advanced Directives  Does Patient Have a Medical Advance Directive? Yes  Type of Estate agent of [place];Living will  Does patient want to make changes to medical advance directive? No - Patient declined   Advance Care Planning is important because it: Ensures you receive medical care that aligns with your values, goals, and  preferences. Provides guidance to your family and loved ones, reducing the emotional burden of decision-making during critical moments.  Vision: Annual vision screenings are recommended for early detection of glaucoma, cataracts, and diabetic retinopathy. These exams can also reveal signs of chronic conditions such as diabetes and high blood pressure.  Dental: Annual dental screenings help detect early signs of oral cancer, gum disease, and other conditions linked to overall health, including heart disease and diabetes.  Please see the attached documents for additional preventive care recommendations. Next appointment on 02/20/2024.

## 2024-02-20 ENCOUNTER — Ambulatory Visit: Payer: Medicare HMO | Admitting: Internal Medicine

## 2024-02-20 ENCOUNTER — Encounter: Payer: Self-pay | Admitting: Internal Medicine

## 2024-02-20 VITALS — BP 118/82 | HR 85 | Temp 97.9°F | Ht 68.5 in | Wt 172.0 lb

## 2024-02-20 DIAGNOSIS — Z0000 Encounter for general adult medical examination without abnormal findings: Secondary | ICD-10-CM | POA: Diagnosis not present

## 2024-02-20 DIAGNOSIS — N401 Enlarged prostate with lower urinary tract symptoms: Secondary | ICD-10-CM | POA: Diagnosis not present

## 2024-02-20 DIAGNOSIS — I10 Essential (primary) hypertension: Secondary | ICD-10-CM

## 2024-02-20 DIAGNOSIS — N138 Other obstructive and reflux uropathy: Secondary | ICD-10-CM

## 2024-02-20 DIAGNOSIS — E039 Hypothyroidism, unspecified: Secondary | ICD-10-CM

## 2024-02-20 DIAGNOSIS — R739 Hyperglycemia, unspecified: Secondary | ICD-10-CM | POA: Diagnosis not present

## 2024-02-20 LAB — LIPID PANEL
Cholesterol: 168 mg/dL (ref 0–200)
HDL: 61.2 mg/dL (ref >39.00)
LDL Cholesterol: 95 mg/dL (ref 0–99)
NonHDL: 106.73
Total CHOL/HDL Ratio: 3
Triglycerides: 58 mg/dL (ref 0.0–149.0)
VLDL: 11.6 mg/dL (ref 0.0–40.0)

## 2024-02-20 LAB — COMPREHENSIVE METABOLIC PANEL WITH GFR
ALT: 17 U/L (ref 0–53)
AST: 22 U/L (ref 0–37)
Albumin: 4.3 g/dL (ref 3.5–5.2)
Alkaline Phosphatase: 79 U/L (ref 39–117)
BUN: 24 mg/dL — ABNORMAL HIGH (ref 6–23)
CO2: 25 meq/L (ref 19–32)
Calcium: 9.2 mg/dL (ref 8.4–10.5)
Chloride: 106 meq/L (ref 96–112)
Creatinine, Ser: 1.21 mg/dL (ref 0.40–1.50)
GFR: 55.37 mL/min — ABNORMAL LOW (ref >60.00)
Glucose, Bld: 89 mg/dL (ref 70–99)
Potassium: 4.5 meq/L (ref 3.5–5.1)
Sodium: 139 meq/L (ref 135–145)
Total Bilirubin: 0.7 mg/dL (ref 0.2–1.2)
Total Protein: 6.9 g/dL (ref 6.0–8.3)

## 2024-02-20 LAB — CBC
HCT: 42.8 % (ref 39.0–52.0)
Hemoglobin: 14.1 g/dL (ref 13.0–17.0)
MCHC: 32.9 g/dL (ref 30.0–36.0)
MCV: 91.1 fl (ref 78.0–100.0)
Platelets: 146 K/uL — ABNORMAL LOW (ref 150.0–400.0)
RBC: 4.71 Mil/uL (ref 4.22–5.81)
RDW: 14.9 % (ref 11.5–15.5)
WBC: 6.8 K/uL (ref 4.0–10.5)

## 2024-02-20 LAB — HEMOGLOBIN A1C: Hgb A1c MFr Bld: 5.9 % (ref 4.6–6.5)

## 2024-02-20 LAB — TSH: TSH: 3.69 u[IU]/mL (ref 0.35–5.50)

## 2024-02-20 LAB — T4, FREE: Free T4: 0.94 ng/dL (ref 0.60–1.60)

## 2024-02-20 LAB — PSA: PSA: 0.98 ng/mL (ref 0.10–4.00)

## 2024-02-20 MED ORDER — LEVOTHYROXINE SODIUM 50 MCG PO TABS: 50.0000 ug | ORAL_TABLET | Freq: Every day | ORAL | 3 refills | Status: AC

## 2024-02-20 MED ORDER — DOXAZOSIN MESYLATE 4 MG PO TABS: 4.0000 mg | ORAL_TABLET | Freq: Every day | ORAL | 3 refills | Status: AC

## 2024-02-20 MED ORDER — LISINOPRIL 10 MG PO TABS: 10.0000 mg | ORAL_TABLET | Freq: Every day | ORAL | 3 refills | Status: AC

## 2024-02-20 MED ORDER — COVID-19 MRNA VACC (MODERNA) 50 MCG/0.5ML IM SUSY: 0.5000 mL | PREFILLED_SYRINGE | Freq: Once | INTRAMUSCULAR | 0 refills | Status: AC

## 2024-02-20 NOTE — Assessment & Plan Note (Signed)
 Checking HGA1c.

## 2024-02-20 NOTE — Assessment & Plan Note (Signed)
 Checking CMP and adjust lisinopril  10 mg daily as needed. Asked him to move dose to evening to see if dizziness is eliminated.

## 2024-02-20 NOTE — Assessment & Plan Note (Signed)
Flu shot yearly. Pneumonia complete. Shingrix complete. Tetanus up to date. Colonoscopy aged out. Counseled about sun safety and mole surveillance. Counseled about the dangers of distracted driving. Given 10 year screening recommendations.

## 2024-02-20 NOTE — Assessment & Plan Note (Signed)
 Checking PSA and adjust doxazosin  as needed. Symptoms are present but manageable.

## 2024-02-20 NOTE — Assessment & Plan Note (Signed)
Checking TSH and adjust as needed.  

## 2024-02-20 NOTE — Progress Notes (Signed)
   Subjective:   Patient ID: Nathan Reilly, male    DOB: [birth year], 83 y.o.   MRN: [id number]  The patient is here for physical. Pertinent topics discussed: Discussed the use of AI scribe software for clinical note transcription with the patient, who gave verbal consent to proceed.  History of Present Illness Nathan Reilly is an 83 year old male who presents for a follow-up visit.  He remains active, mowing his yard, blowing leaves, and working part-time at [employer]. He also has college-age grandchildren and younger grandchildren aged fourteen and eleven.  PMH, [facility], social history reviewed and updated  Review of Systems  Constitutional: Negative.   HENT: Negative.    Eyes: Negative.   Respiratory:  Negative for cough, chest tightness and shortness of breath.   Cardiovascular:  Negative for chest pain, palpitations and leg swelling.  Gastrointestinal:  Negative for abdominal distention, abdominal pain, constipation, diarrhea, nausea and vomiting.  Musculoskeletal: Negative.   Skin: Negative.   Neurological: Negative.   Psychiatric/Behavioral: Negative.      Objective:  Physical Exam Constitutional:      Appearance: He is well-developed.  HENT:     Head: Normocephalic and atraumatic.  Cardiovascular:     Rate and Rhythm: Normal rate and regular rhythm.  Pulmonary:     Effort: Pulmonary effort is normal. No respiratory distress.     Breath sounds: Normal breath sounds. No wheezing or rales.  Abdominal:     General: Bowel sounds are normal. There is no distension.     Palpations: Abdomen is soft.     Tenderness: There is no abdominal tenderness.  Musculoskeletal:     Cervical back: Normal range of motion.  Skin:    General: Skin is warm and dry.  Neurological:     Mental Status: He is alert and oriented to person, place, and time.     Coordination: Coordination normal.     Vitals:   02/20/24 0844  BP: 118/82  Pulse: 85  Temp: 97.9 F (36.6 C)   TempSrc: Oral  SpO2: 99%  Weight: 172 lb (78 kg)  Height: 5' 8.5 (1.74 m)    Assessment & Plan:

## 2024-02-24 ENCOUNTER — Ambulatory Visit: Payer: Self-pay | Admitting: Internal Medicine

## 2024-03-04 DIAGNOSIS — H524 Presbyopia: Secondary | ICD-10-CM | POA: Diagnosis not present

## 2024-04-29 DIAGNOSIS — Z85828 Personal history of other malignant neoplasm of skin: Secondary | ICD-10-CM | POA: Diagnosis not present

## 2024-04-29 DIAGNOSIS — L578 Other skin changes due to chronic exposure to nonionizing radiation: Secondary | ICD-10-CM | POA: Diagnosis not present

## 2024-04-29 DIAGNOSIS — D225 Melanocytic nevi of trunk: Secondary | ICD-10-CM | POA: Diagnosis not present

## 2024-04-29 DIAGNOSIS — Z85820 Personal history of malignant melanoma of skin: Secondary | ICD-10-CM | POA: Diagnosis not present

## 2024-04-29 DIAGNOSIS — L570 Actinic keratosis: Secondary | ICD-10-CM | POA: Diagnosis not present

## 2024-04-29 DIAGNOSIS — Z86018 Personal history of other benign neoplasm: Secondary | ICD-10-CM | POA: Diagnosis not present

## 2024-04-29 DIAGNOSIS — L821 Other seborrheic keratosis: Secondary | ICD-10-CM | POA: Diagnosis not present

## 2024-06-17 ENCOUNTER — Ambulatory Visit: Payer: Self-pay | Admitting: Family Medicine

## 2024-06-17 ENCOUNTER — Ambulatory Visit: Admitting: Family Medicine

## 2024-06-17 ENCOUNTER — Encounter: Payer: Self-pay | Admitting: Family Medicine

## 2024-06-17 ENCOUNTER — Ambulatory Visit (INDEPENDENT_AMBULATORY_CARE_PROVIDER_SITE_OTHER)

## 2024-06-17 VITALS — BP 120/68 | HR 62 | Temp 97.9°F | Ht 68.5 in | Wt 178.0 lb

## 2024-06-17 DIAGNOSIS — R5383 Other fatigue: Secondary | ICD-10-CM | POA: Diagnosis not present

## 2024-06-17 DIAGNOSIS — R062 Wheezing: Secondary | ICD-10-CM | POA: Diagnosis not present

## 2024-06-17 DIAGNOSIS — R051 Acute cough: Secondary | ICD-10-CM | POA: Diagnosis not present

## 2024-06-17 MED ORDER — ALBUTEROL SULFATE HFA 108 (90 BASE) MCG/ACT IN AERS: 2.0000 | INHALATION_SPRAY | Freq: Four times a day (QID) | RESPIRATORY_TRACT | 0 refills | Status: AC | PRN

## 2024-06-17 MED ORDER — BENZONATATE 200 MG PO CAPS: 200.0000 mg | ORAL_CAPSULE | Freq: Two times a day (BID) | ORAL | 0 refills | Status: AC | PRN

## 2024-06-17 MED ORDER — PREDNISONE 20 MG PO TABS: 40.0000 mg | ORAL_TABLET | Freq: Every day | ORAL | 0 refills | Status: AC

## 2024-06-17 NOTE — Patient Instructions (Signed)
 Please go downstairs for a chest x-ray before you leave.  You can keep taking Mucinex over-the-counter.  Stay hydrated.  I sent an albuterol  inhaler to your pharmacy along with a short course of steroids and Tessalon  Perles to help with your cough.  I will let you know your x-ray results and whether or not I send an antibiotic to your pharmacy.  Let us  know if you have any new or worsening symptoms

## 2024-06-17 NOTE — Progress Notes (Signed)
 Subjective:    Discussed the use of AI scribe software for clinical note transcription with the patient, who gave verbal consent to proceed.  History of Present Illness Nathan Reilly is an 84 year old male who presents with a six-day history of cough and fatigue.  Cough and upper respiratory symptoms - Six-day history of cough, initially dry and now productive of white mucus - Nasal congestion present - Cough severe enough to disrupt sleep, though less frequent last night - Wheezing, especially when lying down at night - No fever, chills, body aches, headache, sinus pain, sore throat, or ear pain - Over-the-counter Mucinex taken with some improvement in mucus clearance  Fatigue and lethargy - Fatigue and lethargy for the past three to four days - Limitation of activity due to fatigue  Otologic symptoms - Intermittent itching and sensation of fluid and fullness in the left ear - Decreased hearing in the left ear  Relevant negative history - No history of pneumonia or asthma - No history of smoking - No testing for influenza or COVID-19 during this illness     ROS as in subjective.   Objective: Vitals:   06/17/24 1256  BP: 120/68  Pulse: 62  Temp: 97.9 F (36.6 C)  SpO2: 96%    General appearance: Alert, WD/WN, no distress, mildly ill appearing                             Skin: warm, no rash                           Head: no sinus tenderness                            Eyes: conjunctiva normal, corneas clear, PERRLA                            Ears: pearly TMs, right TM slightly erythematous, external ear canals normal                          Nose: septum midline, turbinates swollen, with erythema and clear discharge             Mouth/throat: MMM, tongue normal, mild pharyngeal erythema                           Neck: supple, no adenopathy, no thyromegaly, nontender                          Heart: RRR                         Lungs: LLL with coarse lung  sounds, CTA otherwise, no wheezes      Assessment/Plan:  Assessment and Plan Assessment & Plan Acute lower respiratory infection, rule out pneumonia Six-day history of cough with mucus production, fatigue, and wheezing. No fever, chills, or body aches. Productive cough with white mucus. No history of pneumonia or asthma. Wheezing present, especially when lying down. Abnormal lung sounds in the left lower lung. Differential includes viral illness versus pneumonia. Wife had similar symptoms a week prior, resolved without medical intervention. Concern for pneumonia due to lack of improvement and abnormal  lung sounds. - Ordered chest x-ray to evaluate for pneumonia - Prescribed albuterol  inhaler - Prescribed Tessalon  Perles for cough management - Oral prednisone  x 5 days sent to the pharmacy - Continue Mucinex for mucus relief - Will consider antibiotics based on x-ray results - Strict precautions to follow-up if worsening

## 2025-02-21 ENCOUNTER — Encounter: Admitting: Internal Medicine
